# Patient Record
Sex: Female | Born: 1990 | Hispanic: No | Marital: Single | Smoking: Never smoker
Health system: Southern US, Community
[De-identification: ages and names within clinical notes are randomized; demographics above are authoritative.]

## PROBLEM LIST (undated history)

## (undated) DIAGNOSIS — K5090 Crohn's disease, unspecified, without complications: Secondary | ICD-10-CM

## (undated) DIAGNOSIS — I10 Essential (primary) hypertension: Secondary | ICD-10-CM

## (undated) DIAGNOSIS — D649 Anemia, unspecified: Secondary | ICD-10-CM

## (undated) DIAGNOSIS — N83209 Unspecified ovarian cyst, unspecified side: Secondary | ICD-10-CM

## (undated) DIAGNOSIS — F419 Anxiety disorder, unspecified: Secondary | ICD-10-CM

## (undated) DIAGNOSIS — D72821 Monocytosis (symptomatic): Secondary | ICD-10-CM

## (undated) HISTORY — PX: NO PAST SURGERIES: SHX2092

## (undated) HISTORY — PX: TONSILLECTOMY: SUR1361

---

## 2012-04-23 ENCOUNTER — Emergency Department (HOSPITAL_BASED_OUTPATIENT_CLINIC_OR_DEPARTMENT_OTHER)
Admission: EM | Admit: 2012-04-23 | Discharge: 2012-04-23 | Disposition: A | Discharge status: Home / Self Care | Payer: Medicaid Other | Attending: Emergency Medicine | Admitting: Emergency Medicine

## 2012-04-23 ENCOUNTER — Encounter (HOSPITAL_BASED_OUTPATIENT_CLINIC_OR_DEPARTMENT_OTHER): Payer: Self-pay

## 2012-04-23 ENCOUNTER — Emergency Department (HOSPITAL_BASED_OUTPATIENT_CLINIC_OR_DEPARTMENT_OTHER): Payer: Medicaid Other

## 2012-04-23 DIAGNOSIS — R109 Unspecified abdominal pain: Secondary | ICD-10-CM

## 2012-04-23 DIAGNOSIS — D649 Anemia, unspecified: Secondary | ICD-10-CM | POA: Insufficient documentation

## 2012-04-23 DIAGNOSIS — N83209 Unspecified ovarian cyst, unspecified side: Secondary | ICD-10-CM | POA: Insufficient documentation

## 2012-04-23 DIAGNOSIS — N390 Urinary tract infection, site not specified: Secondary | ICD-10-CM | POA: Insufficient documentation

## 2012-04-23 LAB — COMPREHENSIVE METABOLIC PANEL
BUN: 6 mg/dL (ref 6–23)
CO2: 27 mEq/L (ref 19–32)
Chloride: 104 mEq/L (ref 96–112)
Creatinine, Ser: 0.6 mg/dL (ref 0.50–1.10)
GFR calc non Af Amer: >90 mL/min (ref >90)
Total Bilirubin: 0.3 mg/dL (ref 0.3–1.2)

## 2012-04-23 LAB — URINALYSIS, ROUTINE W REFLEX MICROSCOPIC
Glucose, UA: NEGATIVE mg/dL
Protein, ur: NEGATIVE mg/dL

## 2012-04-23 LAB — PREGNANCY, URINE: Preg Test, Ur: NEGATIVE

## 2012-04-23 LAB — CBC WITH DIFFERENTIAL/PLATELET
HCT: 26.3 % — ABNORMAL LOW (ref 36.0–46.0)
Hemoglobin: 8.4 g/dL — ABNORMAL LOW (ref 12.0–15.0)
Lymphocytes Relative: 23 % (ref 12–46)
MCHC: 31.9 g/dL (ref 30.0–36.0)
Monocytes Absolute: 1.6 10*3/uL — ABNORMAL HIGH (ref 0.1–1.0)
Monocytes Relative: 23 % — ABNORMAL HIGH (ref 3–12)
Neutro Abs: 3.4 10*3/uL (ref 1.7–7.7)
WBC: 6.9 10*3/uL (ref 4.0–10.5)

## 2012-04-23 LAB — LIPASE, BLOOD: Lipase: 15 U/L (ref 11–59)

## 2012-04-23 LAB — RAPID STREP SCREEN (MED CTR MEBANE ONLY): Streptococcus, Group A Screen (Direct): NEGATIVE

## 2012-04-23 IMAGING — CT CT ABD-PELV W/O CM
2 of 4 series · 16 of 46 positions shown, 18 images · non-contrast
Comparison: No priors.

CLINICAL DATA: Left-sided abdominal pain.  Hematuria.

CT ABDOMEN AND PELVIS WITHOUT CONTRAST
TECHNIQUE: Multidetector CT imaging of the abdomen and pelvis was
performed following the standard protocol without intravenous
contrast.

[Series 2: renal stone < 200 lbs 5.0 b31f · axial · 0.55mm/px · z∈[-433,-43]mm · 13 of 86 slices shown, 15 images]
[im 4/86  soft-tissue]
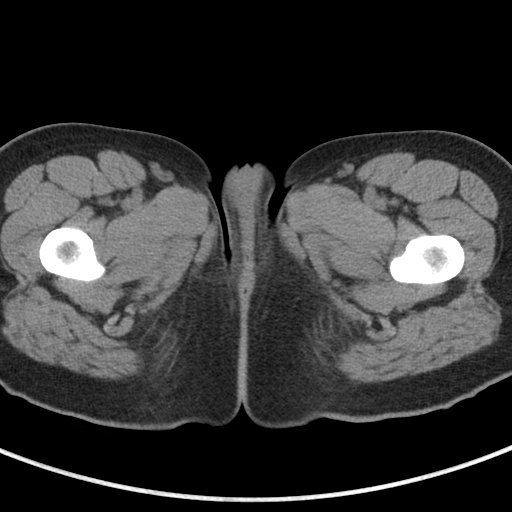
[im 4/86  bone]
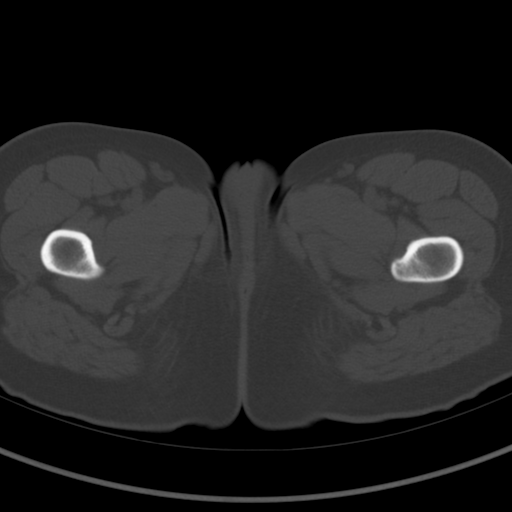
[im 11/86  soft-tissue]
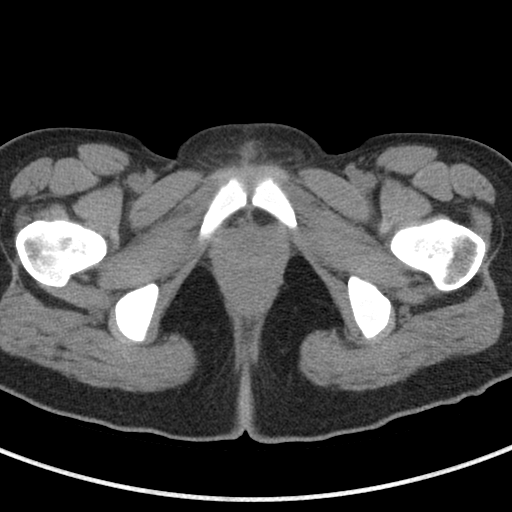
[im 18/86  soft-tissue]
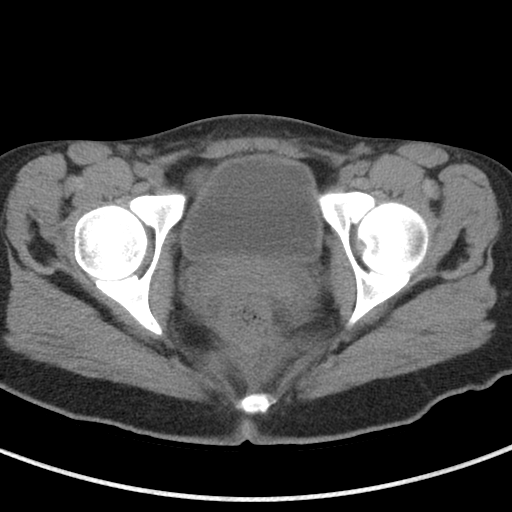
[im 24/86  soft-tissue]
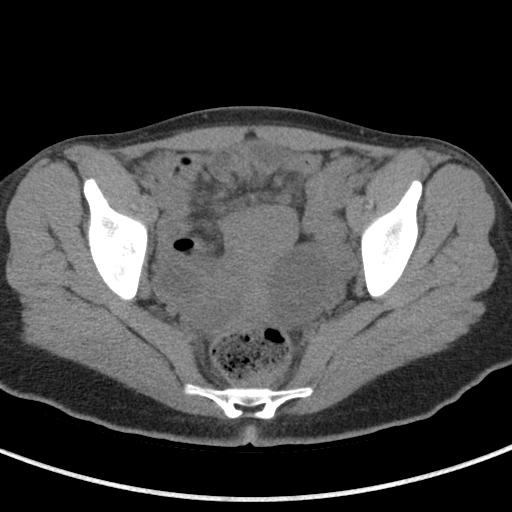
[im 31/86  soft-tissue]
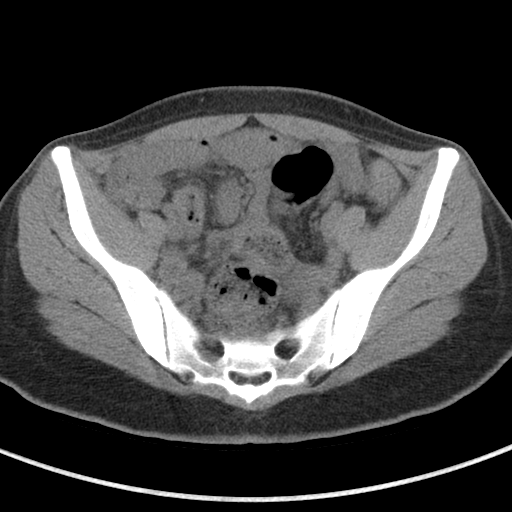
[im 38/86  soft-tissue]
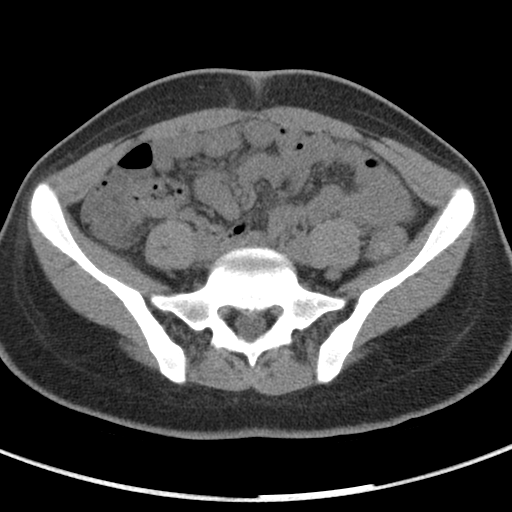
[im 45/86  soft-tissue]
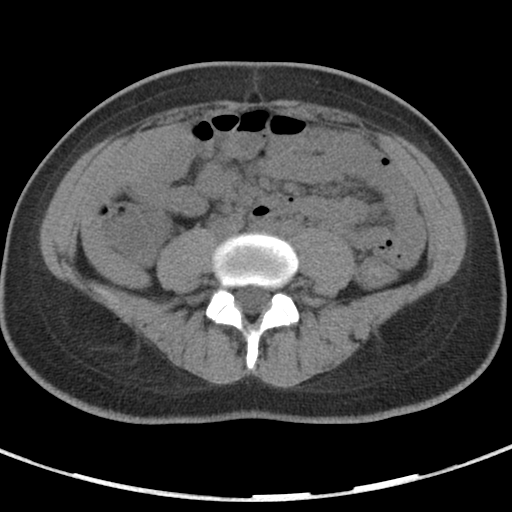
[im 48/86  soft-tissue]
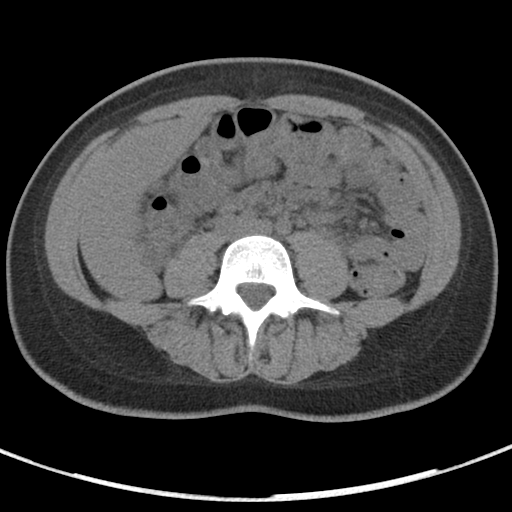
[im 55/86  soft-tissue]
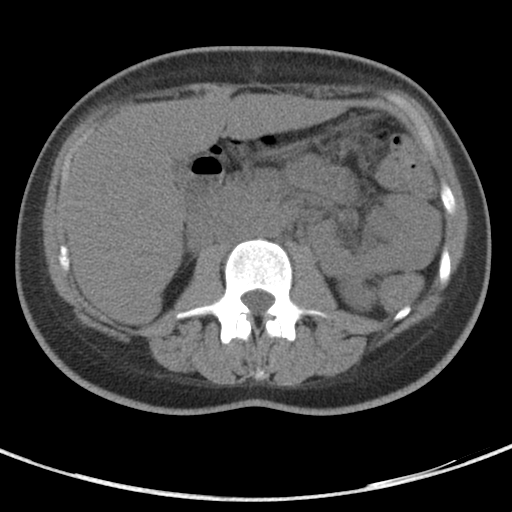
[im 55/86  bone]
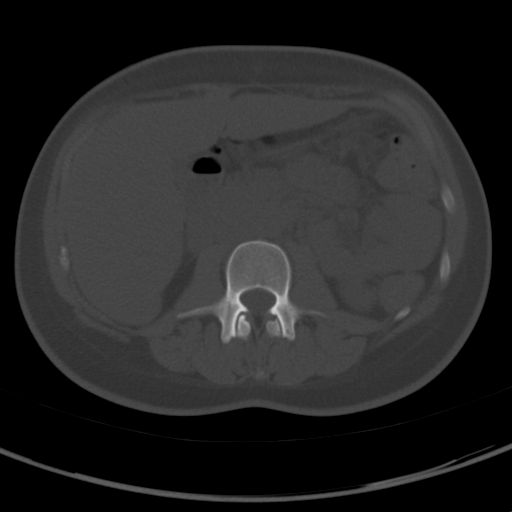
[im 62/86  soft-tissue]
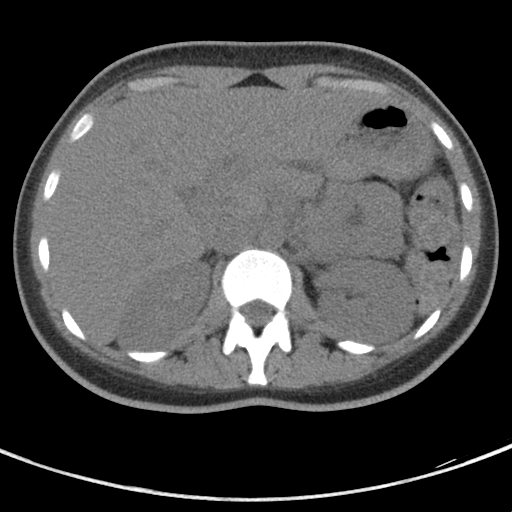
[im 69/86  soft-tissue]
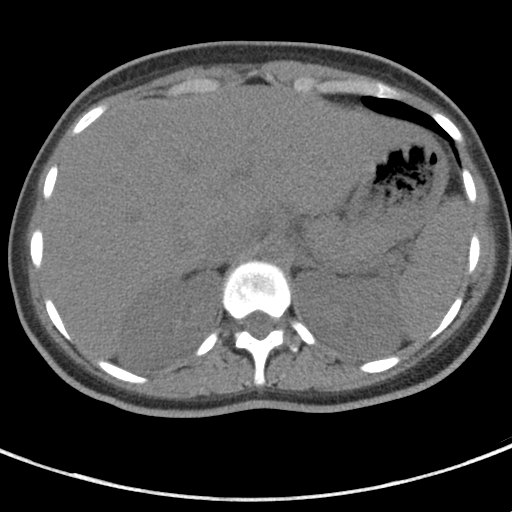
[im 75/86  soft-tissue]
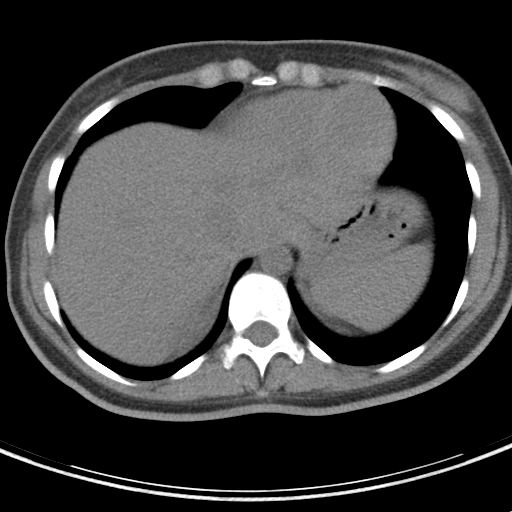
[im 82/86  soft-tissue]
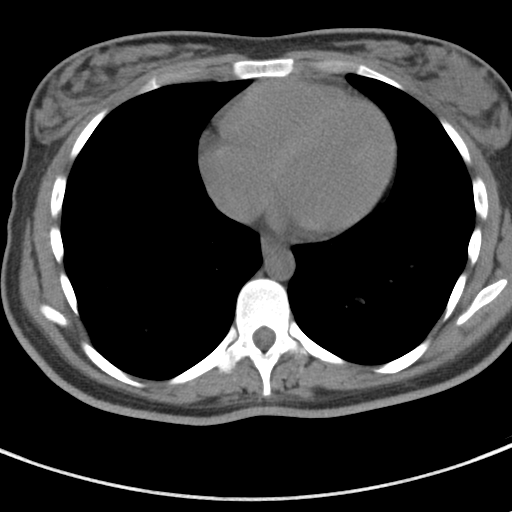

[Series 4: renal stone 3.0 coronal · coronal · 0.62mm/px · 3 of 67 slices shown]
[im 23/67  soft-tissue]
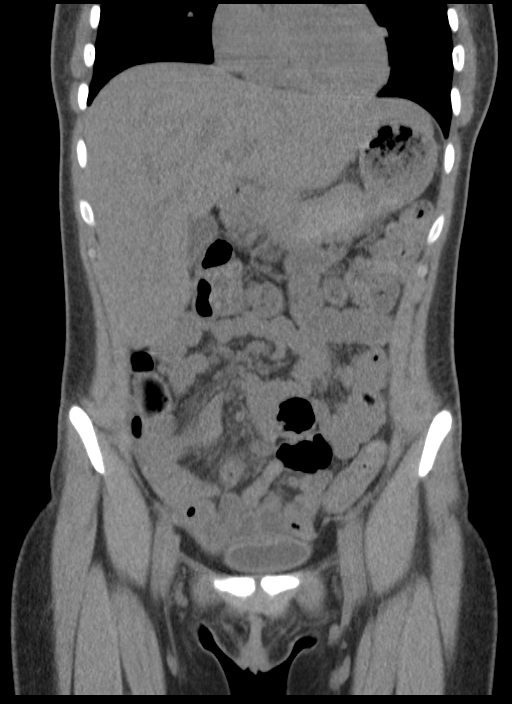
[im 30/67  soft-tissue]
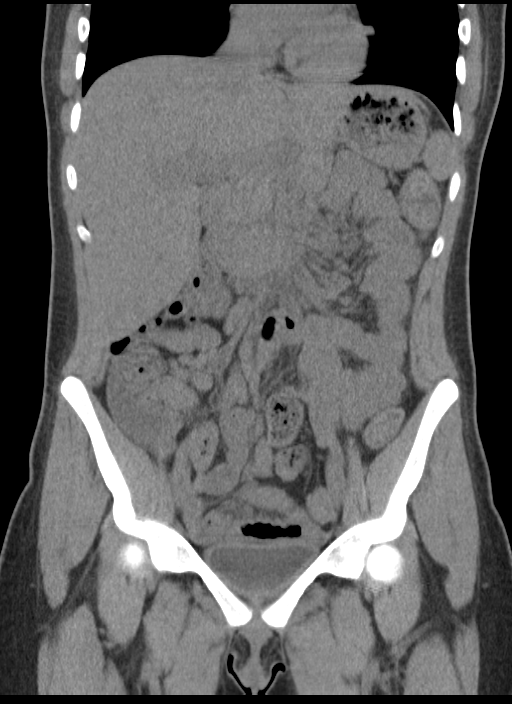
[im 37/67  soft-tissue]
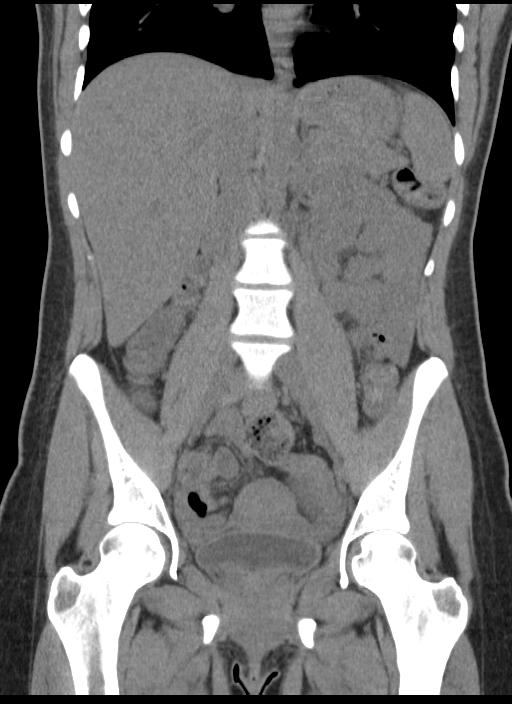

[16 of 46 positions shown; findings below may reference images not displayed]

FINDINGS: Lung Bases: Unremarkable.

Abdomen/Pelvis:  Normal calcifications within the collecting system
of either kidney, along the course of either ureter, or within the
lumen of the urinary bladder.  No hydroureteronephrosis or
perinephric stranding to suggest urinary tract obstruction at this
time.

The unenhanced appearance of the liver, gallbladder, pancreas,
spleen, bilateral adrenal glands and bilateral kidneys is
unremarkable.  There is no ascites or pneumoperitoneum and no
pathologic distension of bowel.  No definite pathologic
lymphadenopathy identified within the abdomen or pelvis on this
noncontrast CT examination.  There are low attenuation lesions in
the adnexa bilaterally, the largest of which measures 4.6 x 3.9 x
5.4 cm on the left side, favored to represent ovarian cysts.
Uterus is unremarkable.  Urinary bladder is normal in appearance.

Musculoskeletal: There are no aggressive appearing lytic or blastic
lesions noted in the visualized portions of the skeleton.
IMPRESSION: 1.  No evidence of abnormal urinary tract calculi or findings to
suggest urinary tract obstruction.
2.  Multiple bilateral low attenuation adnexal lesions likely
represent ovarian cysts.  The largest of these measures 4.6 x 3.9 x
5.4 cm in the left ovary.  This could be followed with a
transvaginal ultrasound at 6 -10 weeks (i.e., at a different phase
of the patient's menstrual cycle) to ensure resolution.
Alternatively, if there is any clinical concern for ovarian
torsion, an ultrasound examination may be warranted at this time.

## 2012-04-23 MED ORDER — POTASSIUM CHLORIDE CRYS ER 20 MEQ PO TBCR
40.0000 meq | EXTENDED_RELEASE_TABLET | Freq: Once | ORAL | Status: AC
  Administered 2012-04-23: 40 meq via ORAL
  Filled 2012-04-23: qty 2

## 2012-04-23 MED ORDER — SULFAMETHOXAZOLE-TRIMETHOPRIM 800-160 MG PO TABS: 1.0000 | ORAL_TABLET | Freq: Two times a day (BID) | ORAL | Status: AC

## 2012-04-23 NOTE — ED Provider Notes (Signed)
History     CSN: [id number]  Arrival date & time 04/23/12  0714   First MD Initiated Contact with Patient 04/23/12 0740      Chief Complaint  Patient presents with  . Abdominal Pain    (Consider location/radiation/quality/duration/timing/severity/associated sxs/prior treatment) HPI  Patient complaining of abdominal pain for 2 months. She states that it is crampy in her upper abdomen. She does not have any nausea or vomiting but feels like she may have been eating less secondary to this. The pain comes and goes. Often starts after having eaten. The pain is moderate. She denies any fever, chills, diarrhea, urinary tract infection symptoms, abnormal vaginal discharge. She has regular menses that are heavy and has been particularly previously diagnosed with anemia  History reviewed. No pertinent past medical history.  History reviewed. No pertinent past surgical history.  No family history on file.  History  Substance Use Topics  . Smoking status: Never Smoker   . Smokeless tobacco: Never Used  . Alcohol Use: No    OB History    Grav Para Term Preterm Abortions TAB SAB Ect Mult Living                  Review of Systems  All other systems reviewed and are negative.    Allergies  Benadryl and Penicillins  Home Medications  No current outpatient prescriptions on file.  BP 117/66  Pulse 104  Temp 97.9 F (36.6 C) (Oral)  Resp 16  Ht 5\' 2"  (1.575 m)  Wt 125 lb (56.7 kg)  BMI 22.86 kg/m2  SpO2 100%  LMP 04/02/2012  Physical Exam  Nursing note and vitals reviewed. Constitutional: She appears well-developed and well-nourished.  HENT:  Head: Normocephalic and atraumatic.  Eyes: Conjunctivae and EOM are normal. Pupils are equal, round, and reactive to light.  Neck: Normal range of motion. Neck supple.  Cardiovascular: Normal rate, regular rhythm, normal heart sounds and intact distal pulses.   Pulmonary/Chest: Effort normal and breath sounds normal.  Abdominal:  Soft. Bowel sounds are normal. There is hepatomegaly. There is no splenomegaly. There is no rebound and no CVA tenderness. No hernia.       Mild epigastric and left upper quadrant ttp  Musculoskeletal: Normal range of motion.  Neurological: She is alert.  Skin: Skin is warm and dry.  Psychiatric: She has a normal mood and affect. Thought content normal.    ED Course  Procedures (including critical care time)  Labs Reviewed  URINALYSIS, ROUTINE W REFLEX MICROSCOPIC - Abnormal; Notable for the following:    Color, Urine AMBER (*)  BIOCHEMICALS MAY BE AFFECTED BY COLOR   Hgb urine dipstick SMALL (*)     Bilirubin Urine SMALL (*)     Ketones, ur 15 (*)     Leukocytes, UA TRACE (*)     All other components within normal limits  CBC WITH DIFFERENTIAL - Abnormal; Notable for the following:    RBC 3.54 (*)     Hemoglobin 8.4 (*)     HCT 26.3 (*)     MCV 74.3 (*)     MCH 23.7 (*)     Platelets 504 (*)     Monocytes Relative 23 (*)     Monocytes Absolute 1.6 (*)     All other components within normal limits  COMPREHENSIVE METABOLIC PANEL - Abnormal; Notable for the following:    Potassium 3.0 (*)     Glucose, Bld 122 (*)     Albumin 2.6 (*)  All other components within normal limits  PREGNANCY, URINE  LIPASE, BLOOD  RAPID STREP SCREEN  URINE MICROSCOPIC-ADD ON   Ct Abdomen Pelvis Wo Contrast  04/23/2012  *RADIOLOGY REPORT*  Clinical Data: Left-sided abdominal pain.  Hematuria.  CT ABDOMEN AND PELVIS WITHOUT CONTRAST  Technique:  Multidetector CT imaging of the abdomen and pelvis was performed following the standard protocol without intravenous contrast.  Comparison: No priors.  Findings:  Lung Bases: Unremarkable.  Abdomen/Pelvis:  Normal calcifications within the collecting system of either kidney, along the course of either ureter, or within the lumen of the urinary bladder.  No hydroureteronephrosis or perinephric stranding to suggest urinary tract obstruction at this time.  The  unenhanced appearance of the liver, gallbladder, pancreas, spleen, bilateral adrenal glands and bilateral kidneys is unremarkable.  There is no ascites or pneumoperitoneum and no pathologic distension of bowel.  No definite pathologic lymphadenopathy identified within the abdomen or pelvis on this noncontrast CT examination.  There are low attenuation lesions in the adnexa bilaterally, the largest of which measures 4.6 x 3.9 x 5.4 cm on the left side, favored to represent ovarian cysts. Uterus is unremarkable.  Urinary bladder is normal in appearance.  Musculoskeletal: There are no aggressive appearing lytic or blastic lesions noted in the visualized portions of the skeleton.  IMPRESSION: 1.  No evidence of abnormal urinary tract calculi or findings to suggest urinary tract obstruction. 2.  Multiple bilateral low attenuation adnexal lesions likely represent ovarian cysts.  The largest of these measures 4.6 x 3.9 x 5.4 cm in the left ovary.  This could be followed with a transvaginal ultrasound at 6 -10 weeks (i.e., at a different phase of the patient's menstrual cycle) to ensure resolution. Alternatively, if there is any clinical concern for ovarian torsion, an ultrasound examination may be warranted at this time.  Original Report Authenticated By: Florencia Reasons, M.D.     No diagnosis found.    MDM   Results for orders placed during the hospital encounter of 04/23/12  URINALYSIS, ROUTINE W REFLEX MICROSCOPIC      Component Value Range   Color, Urine AMBER (*) YELLOW   APPearance CLEAR  CLEAR   Specific Gravity, Urine 1.017  1.005 - 1.030   pH 6.0  5.0 - 8.0   Glucose, UA NEGATIVE  NEGATIVE mg/dL   Hgb urine dipstick SMALL (*) NEGATIVE   Bilirubin Urine SMALL (*) NEGATIVE   Ketones, ur 15 (*) NEGATIVE mg/dL   Protein, ur NEGATIVE  NEGATIVE mg/dL   Urobilinogen, UA 0.2  0.0 - 1.0 mg/dL   Nitrite NEGATIVE  NEGATIVE   Leukocytes, UA TRACE (*) NEGATIVE  PREGNANCY, URINE      Component Value  Range   Preg Test, Ur NEGATIVE  NEGATIVE  CBC WITH DIFFERENTIAL      Component Value Range   WBC 6.9  4.0 - 10.5 K/uL   RBC 3.54 (*) 3.87 - 5.11 MIL/uL   Hemoglobin 8.4 (*) 12.0 - 15.0 g/dL   HCT 16.1 (*) 09.6 - 04.5 %   MCV 74.3 (*) 78.0 - 100.0 fL   MCH 23.7 (*) 26.0 - 34.0 pg   MCHC 31.9  30.0 - 36.0 g/dL   RDW 40.9  81.1 - 91.4 %   Platelets 504 (*) 150 - 400 K/uL   Neutrophils Relative 49  43 - 77 %   Neutro Abs 3.4  1.7 - 7.7 K/uL   Lymphocytes Relative 23  12 - 46 %   Lymphs  Abs 1.6  0.7 - 4.0 K/uL   Monocytes Relative 23 (*) 3 - 12 %   Monocytes Absolute 1.6 (*) 0.1 - 1.0 K/uL   Eosinophils Relative 5  0 - 5 %   Eosinophils Absolute 0.3  0.0 - 0.7 K/uL   Basophils Relative 0  0 - 1 %   Basophils Absolute 0.0  0.0 - 0.1 K/uL  COMPREHENSIVE METABOLIC PANEL      Component Value Range   Sodium 140  135 - 145 mEq/L   Potassium 3.0 (*) 3.5 - 5.1 mEq/L   Chloride 104  96 - 112 mEq/L   CO2 27  19 - 32 mEq/L   Glucose, Bld 122 (*) 70 - 99 mg/dL   BUN 6  6 - 23 mg/dL   Creatinine, Ser 4.54  0.50 - 1.10 mg/dL   Calcium 9.0  8.4 - 09.8 mg/dL   Total Protein 6.7  6.0 - 8.3 g/dL   Albumin 2.6 (*) 3.5 - 5.2 g/dL   AST 18  0 - 37 U/L   ALT 20  0 - 35 U/L   Alkaline Phosphatase 114  39 - 117 U/L   Total Bilirubin 0.3  0.3 - 1.2 mg/dL   GFR calc non Af Amer >90  >90 mL/min   GFR calc Af Amer >90  >90 mL/min  LIPASE, BLOOD      Component Value Range   Lipase 15  11 - 59 U/L  RAPID STREP SCREEN      Component Value Range   Streptococcus, Group A Screen (Direct) NEGATIVE  NEGATIVE  URINE MICROSCOPIC-ADD ON      Component Value Range   Squamous Epithelial / LPF RARE  RARE   WBC, UA 3-6  <3 WBC/hpf   RBC / HPF 3-6  <3 RBC/hpf   Bacteria, UA RARE  RARE      Patient with hypokalemia and anemia. She denies any lightheadedness or chest pain or shortness of breath. She reports that she is taking iron at home. She does have a primary care Dr. She is advised to continue her iron is  to follow up with her primary care Dr. regarding her anemia. She is given oral repletion of her potassium here. She has 3-6 red blood cells in her urine along with 3-6 white blood cells and rare epithelial cells. She is not currently menstruating. She'll be placed on a short course of antibiotics and is advised have hematuria rechecked. CT of the abdomen without contrast was done due to the left-sided pain with the hematuria. There is no evidence of kidney stone seen on CT scan. She does have ovarian cysts. These will need to be followed up and she is advised. She'll be scheduled for an outpatient ultrasound of the upper abdomen as her pain has been epigastric although more to the left side. She is unable have this obtained morning because she just eaten prior to coming in. She is given referral for followup for her primary care for all the above.  Hilario Quarry, MD 04/23/12 [phone]

## 2012-04-23 NOTE — ED Notes (Signed)
Pt reports generalized abdominal pain that started 2 months ago.

## 2012-04-23 NOTE — ED Notes (Signed)
MD at bedside. 

## 2012-04-23 NOTE — ED Notes (Signed)
Family at bedside. 

## 2012-04-23 NOTE — ED Notes (Signed)
Patient transported to CT 

## 2012-05-06 ENCOUNTER — Encounter (HOSPITAL_BASED_OUTPATIENT_CLINIC_OR_DEPARTMENT_OTHER): Payer: Self-pay | Admitting: *Deleted

## 2012-05-06 ENCOUNTER — Emergency Department (HOSPITAL_BASED_OUTPATIENT_CLINIC_OR_DEPARTMENT_OTHER): Payer: Medicaid Other

## 2012-05-06 ENCOUNTER — Emergency Department (HOSPITAL_BASED_OUTPATIENT_CLINIC_OR_DEPARTMENT_OTHER)
Admission: EM | Admit: 2012-05-06 | Discharge: 2012-05-06 | Disposition: A | Discharge status: Home / Self Care | Payer: Medicaid Other | Attending: Emergency Medicine | Admitting: Emergency Medicine

## 2012-05-06 DIAGNOSIS — R110 Nausea: Secondary | ICD-10-CM | POA: Insufficient documentation

## 2012-05-06 DIAGNOSIS — R509 Fever, unspecified: Secondary | ICD-10-CM | POA: Insufficient documentation

## 2012-05-06 DIAGNOSIS — R1031 Right lower quadrant pain: Secondary | ICD-10-CM | POA: Insufficient documentation

## 2012-05-06 HISTORY — DX: Unspecified ovarian cyst, unspecified side: N83.209

## 2012-05-06 HISTORY — DX: Anemia, unspecified: D64.9

## 2012-05-06 LAB — CBC WITH DIFFERENTIAL/PLATELET
Band Neutrophils: 35 % — ABNORMAL HIGH (ref 0–10)
Blasts: 0 %
HCT: 22.7 % — ABNORMAL LOW (ref 36.0–46.0)
Lymphocytes Relative: 14 % (ref 12–46)
Lymphs Abs: 1.1 10*3/uL (ref 0.7–4.0)
MCH: 23.9 pg — ABNORMAL LOW (ref 26.0–34.0)
MCHC: 32.6 g/dL (ref 30.0–36.0)
Metamyelocytes Relative: 3 %
Myelocytes: 3 %
Promyelocytes Absolute: 1 %
RDW: 16.3 % — ABNORMAL HIGH (ref 11.5–15.5)

## 2012-05-06 LAB — URINALYSIS, ROUTINE W REFLEX MICROSCOPIC
Glucose, UA: NEGATIVE mg/dL
Specific Gravity, Urine: 1.025 (ref 1.005–1.030)
Urobilinogen, UA: 2 mg/dL — ABNORMAL HIGH (ref 0.0–1.0)

## 2012-05-06 LAB — COMPREHENSIVE METABOLIC PANEL
ALT: 24 U/L (ref 0–35)
AST: 17 U/L (ref 0–37)
Albumin: 2.4 g/dL — ABNORMAL LOW (ref 3.5–5.2)
Alkaline Phosphatase: 132 U/L — ABNORMAL HIGH (ref 39–117)
Chloride: 100 mEq/L (ref 96–112)
Potassium: 3 mEq/L — ABNORMAL LOW (ref 3.5–5.1)
Sodium: 133 mEq/L — ABNORMAL LOW (ref 135–145)
Total Bilirubin: 0.7 mg/dL (ref 0.3–1.2)
Total Protein: 6.5 g/dL (ref 6.0–8.3)

## 2012-05-06 LAB — URINE MICROSCOPIC-ADD ON

## 2012-05-06 IMAGING — CT CT ABD-PELV W/ CM
2 of 4 series · 16 of 46 positions shown, 18 images · IV contrast (APPLIED)
Comparison: [DATE]

No acute osseous finding.

CLINICAL DATA: Lower abdominal pain, nausea

CT ABDOMEN AND PELVIS WITH CONTRAST
TECHNIQUE: Multidetector CT imaging of the abdomen and pelvis was
performed following the standard protocol during bolus
administration of intravenous contrast.
Contrast: 36mL OMNIPAQUE IOHEXOL 300 MG/ML  SOLN, 100mL OMNIPAQUE
IOHEXOL 300 MG/ML  SOLN

[Series 2: abd/pelvis 5.0 b31f · axial · 0.58mm/px · z∈[-359,+11]mm · 13 of 82 slices shown, 15 images]
[im 4/82  soft-tissue]
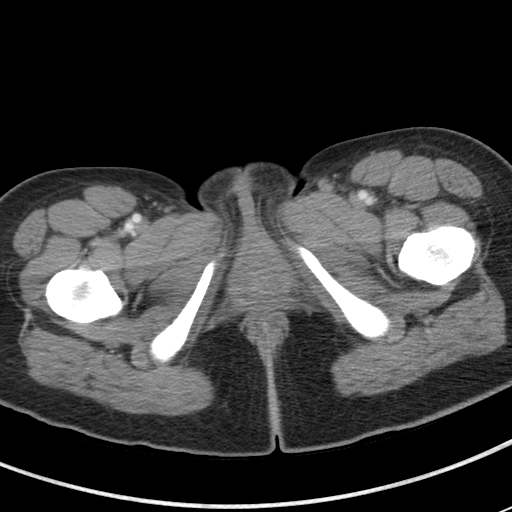
[im 4/82  bone]
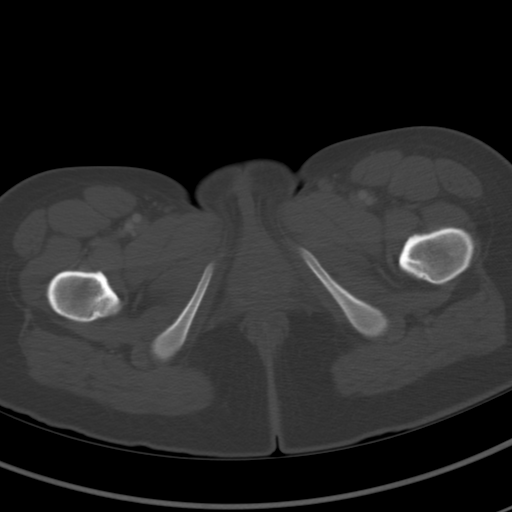
[im 11/82  soft-tissue]
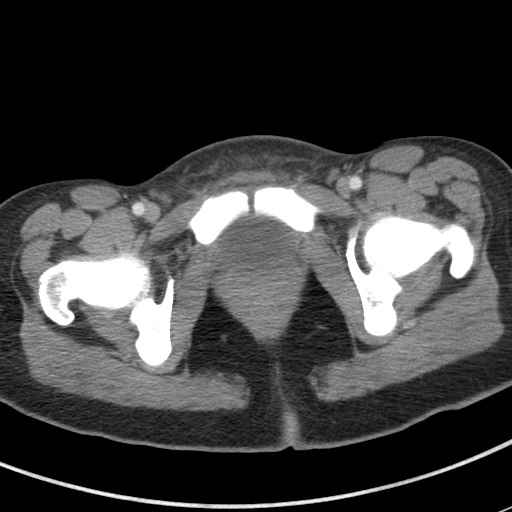
[im 17/82  soft-tissue]
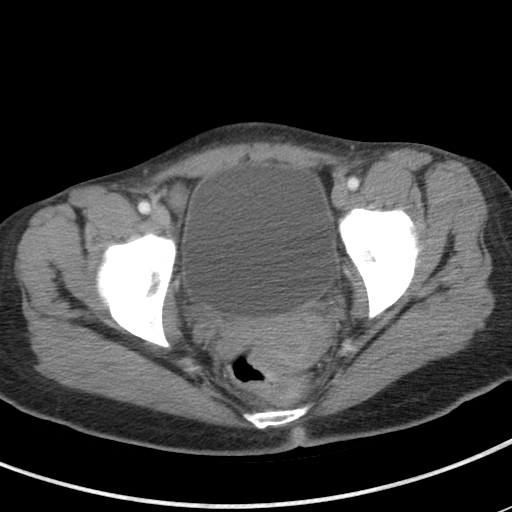
[im 24/82  soft-tissue]
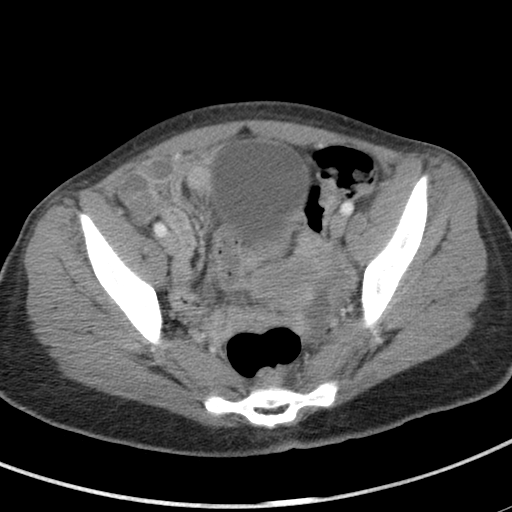
[im 28/82  soft-tissue]
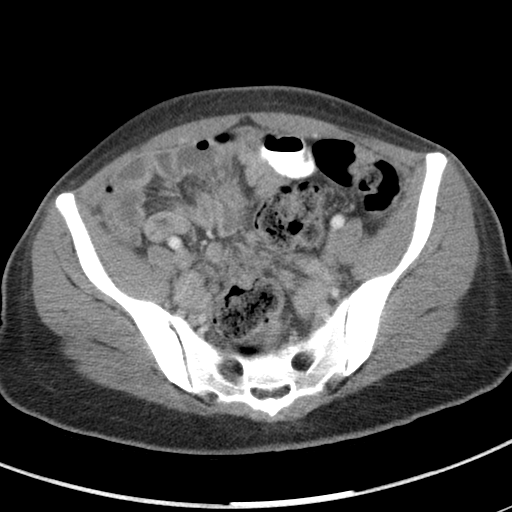
[im 34/82  soft-tissue]
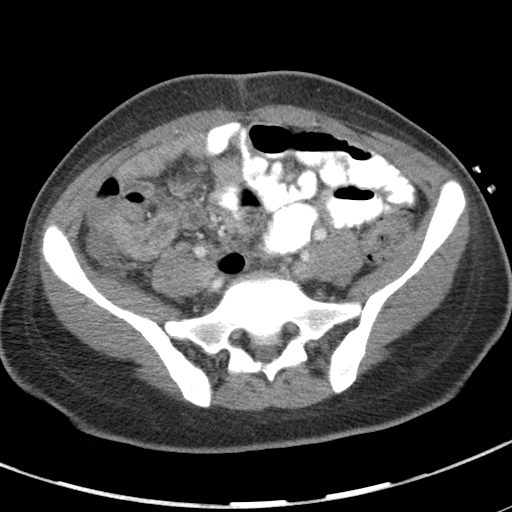
[im 41/82  soft-tissue]
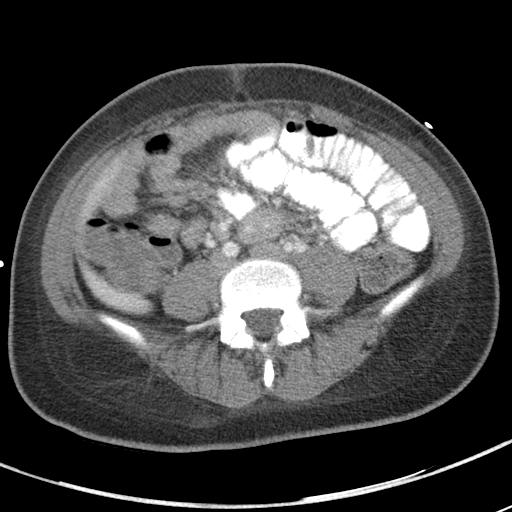
[im 48/82  soft-tissue]
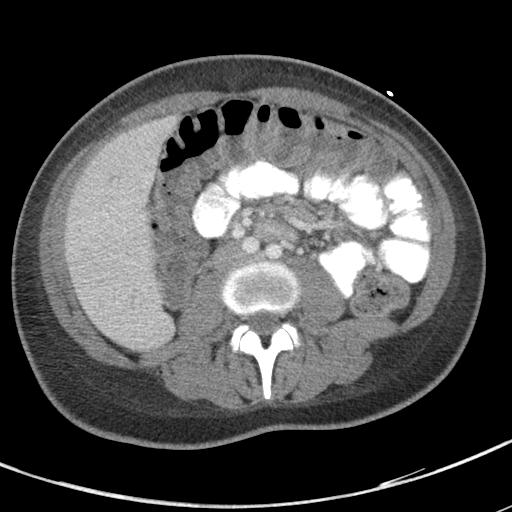
[im 55/82  soft-tissue]
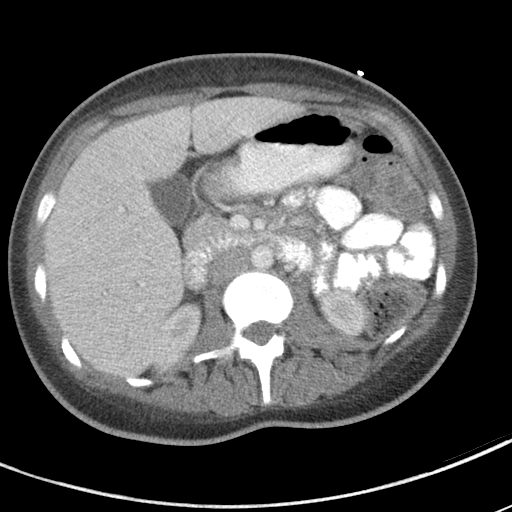
[im 55/82  bone]
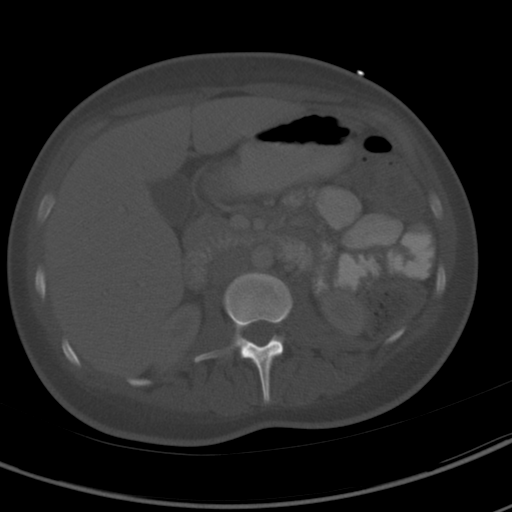
[im 58/82  soft-tissue]
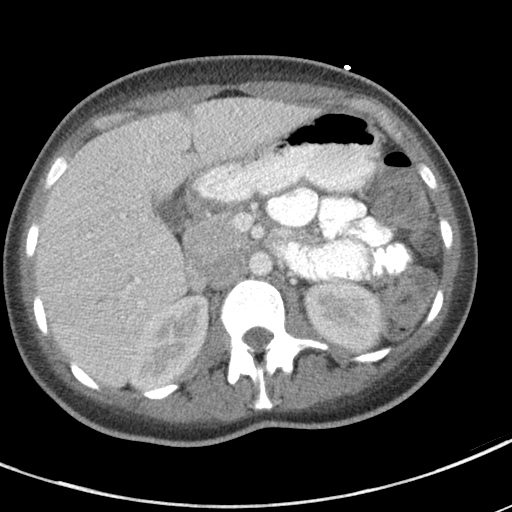
[im 65/82  soft-tissue]
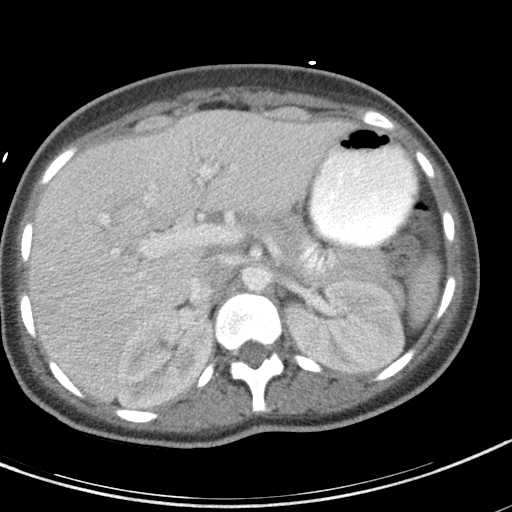
[im 71/82  soft-tissue]
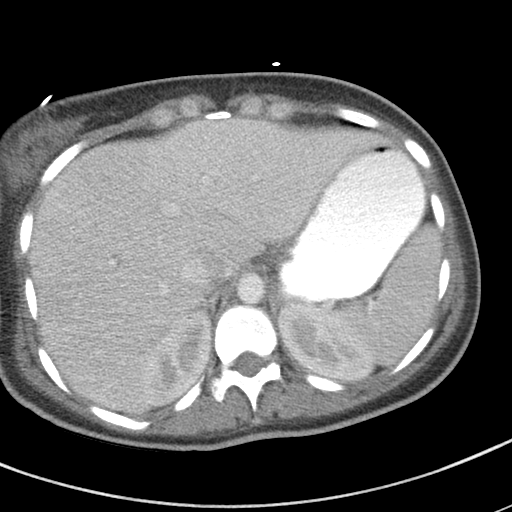
[im 78/82  soft-tissue]
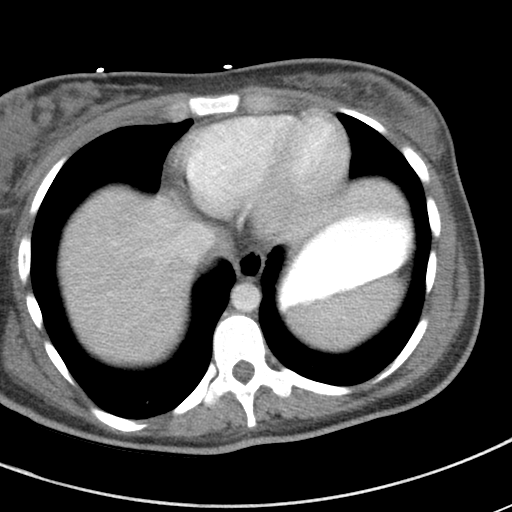

[Series 5: abd/pelvis 3.0 coronal · coronal · 0.63mm/px · 3 of 77 slices shown]
[im 26/77  soft-tissue]
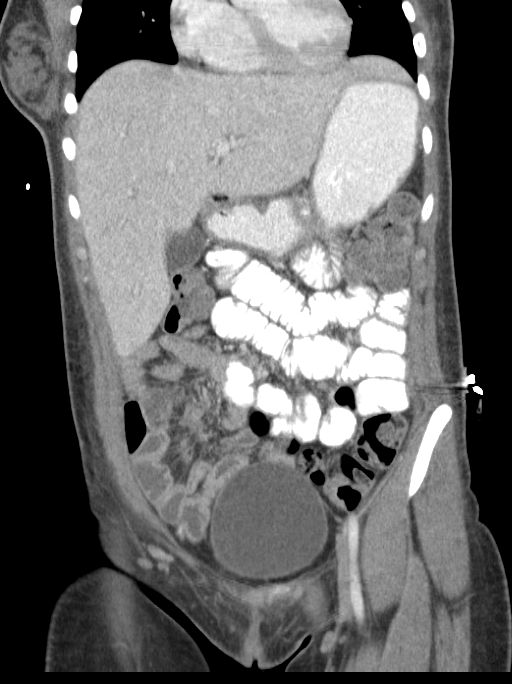
[im 34/77  soft-tissue]
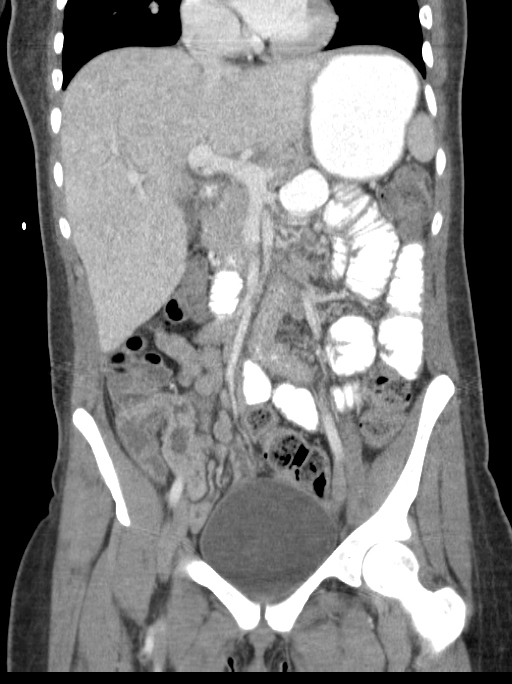
[im 43/77  soft-tissue]
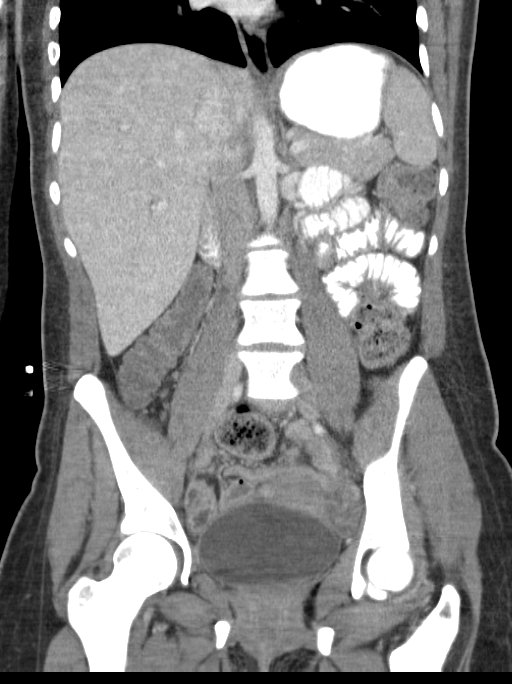

[16 of 46 positions shown; findings below may reference images not displayed]

FINDINGS: Lung bases clear.  Normal heart size.  No pericardial or
pleural effusion.  No hiatal hernia.

Abdomen:  Focal fatty infiltration of the liver along the falciform
ligament, image 26 new.  No other hepatic abnormality or biliary
dilatation.  Gallbladder, biliary system, pancreas, spleen, adrenal
glands, kidneys are within normal limits and demonstrate no acute
finding for the patient's age.

No abdominal free fluid, fluid collection, hemorrhage, adenopathy,
or abscess.

Negative for bowel obstruction, dilatation, ileus, or free air.

Pelvis:  Stable mildly prominent right lower quadrant mesenteric
lymph nodes, image 45.  No abnormal appendix demonstrated.  No
acute distal bowel process.  Trace pelvic free fluid.  Smaller left
adnexal likely ovarian cyst measuring 3.5 x 1.5 cm, previously
x 4.0 cm.  The urinary bladder is unremarkable.  Normal sized
uterus.  No pelvic abscess, hemorrhage, adenopathy, inguinal
abnormality, hernia.

No acute osseous finding
IMPRESSION: No acute intra-abdominal or pelvic process.  Smaller left ovarian
cyst.

## 2012-05-06 MED ORDER — SODIUM CHLORIDE 0.9 % IV BOLUS (SEPSIS)
1000.0000 mL | Freq: Once | INTRAVENOUS | Status: AC
  Administered 2012-05-06: 1000 mL via INTRAVENOUS

## 2012-05-06 MED ORDER — ACETAMINOPHEN 500 MG PO TABS
1000.0000 mg | ORAL_TABLET | Freq: Once | ORAL | Status: AC
  Administered 2012-05-06: 1000 mg via ORAL
  Filled 2012-05-06: qty 2

## 2012-05-06 MED ORDER — IOHEXOL 300 MG/ML  SOLN
100.0000 mL | Freq: Once | INTRAMUSCULAR | Status: AC | PRN
  Administered 2012-05-06: 100 mL via INTRAVENOUS

## 2012-05-06 MED ORDER — SODIUM CHLORIDE 0.9 % IV SOLN
1000.0000 mL | INTRAVENOUS | Status: DC
  Administered 2012-05-06: 1000 mL via INTRAVENOUS

## 2012-05-06 MED ORDER — HYDROCODONE-ACETAMINOPHEN 5-325 MG PO TABS: ORAL_TABLET | ORAL | Status: DC

## 2012-05-06 MED ORDER — SODIUM CHLORIDE 0.9 % IV SOLN
1000.0000 mL | Freq: Once | INTRAVENOUS | Status: AC
  Administered 2012-05-06: 1000 mL via INTRAVENOUS

## 2012-05-06 MED ORDER — IOHEXOL 300 MG/ML  SOLN
36.0000 mL | Freq: Once | INTRAMUSCULAR | Status: AC | PRN
  Administered 2012-05-06: 36 mL via ORAL

## 2012-05-06 MED ORDER — ONDANSETRON HCL 4 MG/2ML IJ SOLN
4.0000 mg | Freq: Once | INTRAMUSCULAR | Status: AC
  Administered 2012-05-06: 4 mg via INTRAVENOUS
  Filled 2012-05-06: qty 2

## 2012-05-06 MED ORDER — MORPHINE SULFATE 4 MG/ML IJ SOLN
4.0000 mg | Freq: Once | INTRAMUSCULAR | Status: AC
  Administered 2012-05-06: 4 mg via INTRAVENOUS
  Filled 2012-05-06: qty 1

## 2012-05-06 NOTE — ED Notes (Signed)
Pt c/o lower abd pain and nausea x 2 days. Seen here 2 weeks ago for same.  DX ovarian cyst

## 2012-05-07 LAB — PATHOLOGIST SMEAR REVIEW: Path Review: NORMAL

## 2012-05-07 NOTE — ED Provider Notes (Signed)
History     CSN: [id number]  Arrival date & time 05/06/12  1445   First MD Initiated Contact with Patient 05/06/12 1548      Chief Complaint  Patient presents with  . Abdominal Pain    (Consider location/radiation/quality/duration/timing/severity/associated sxs/prior treatment) Patient is a 21 y.o. female presenting with abdominal pain.  Abdominal Pain The primary symptoms of the illness include abdominal pain, fever and nausea.   Patient is a 21 year old female who presents today complaining of acute onset is severe 8/10 sharp right lower quadrant pain yesterday. Patient has had some nausea with this but denies any vomiting. Patient also denies diarrhea, constipation, or urinary symptoms. Patient denies vaginal discharge as well and reports that her last period in yesterday. Patient has no concern for sexual transmitted disease otherwise. She does have history of ovarian cyst noted on the left on prior CT from July 24. Patient has no history of abdominal surgeries. Patient reports decreased appetite today as well. Pain is worse with palpation movement.  Patient endorses fevers and was febrile to 103 on presentation with a heart rate of 122. There no other associated or modifying factors. Past Medical History  Diagnosis Date  . Ovarian cyst   . Anemia     History reviewed. No pertinent past surgical history.  History reviewed. No pertinent family history.  History  Substance Use Topics  . Smoking status: Never Smoker   . Smokeless tobacco: Never Used  . Alcohol Use: No    OB History    Grav Para Term Preterm Abortions TAB SAB Ect Mult Living                  Review of Systems  Constitutional: Positive for fever and appetite change.  HENT: Negative.   Eyes: Negative.   Respiratory: Negative.   Cardiovascular: Negative.   Gastrointestinal: Positive for nausea and abdominal pain.  Genitourinary: Negative.   Musculoskeletal: Negative.   Skin: Negative.   Neurological:  Negative.   Hematological: Negative.   Psychiatric/Behavioral: Negative.   All other systems reviewed and are negative.    Allergies  Benadryl and Penicillins  Home Medications   Current Outpatient Rx  Name Route Sig Dispense Refill  . FERROUS SULFATE 325 (65 FE) MG PO TABS Oral Take 325 mg by mouth daily with breakfast.    . ONE-DAILY MULTI VITAMINS PO TABS Oral Take 1 tablet by mouth daily.    Marland Kitchen NAPROXEN SODIUM 220 MG PO TABS Oral Take 440 mg by mouth 2 (two) times daily with a meal. For pain.    . SULFAMETHOXAZOLE-TRIMETHOPRIM 800-160 MG PO TABS Oral Take 1 tablet by mouth 2 (two) times daily.    Marland Kitchen HYDROCODONE-ACETAMINOPHEN 5-325 MG PO TABS  Take 1-2 tabs by mouth every 6 hours when necessary pain. 15 tablet 0    BP 101/64  Pulse 102  Temp 98.7 F (37.1 C) (Oral)  Resp 24  Ht 5\' 2"  (1.575 m)  Wt 125 lb (56.7 kg)  BMI 22.86 kg/m2  SpO2 100%  LMP 05/01/2012  Physical Exam GEN: Well-developed, well-nourished female in no distress HEENT: Atraumatic, normocephalic. Oropharynx clear without erythema EYES: PERRLA BL, no scleral icterus. NECK: Trachea midline, no meningismus CV: Tachycardic with regular rhythm. No murmurs, rubs, or gallops PULM: No respiratory distress.  No crackles, wheezes, or rales. GI: soft, right lower quadrant tenderness. No guarding, rebound. + bowel sounds  GU: deferred Neuro: cranial nerves grossly 2-12 intact, no abnormalities of strength or sensation, A and O  x 3 MSK: Patient moves all 4 extremities symmetrically, no deformity, edema, or injury noted Skin: No rashes petechiae, purpura, or jaundice Psych: no abnormality of mood  ED Course  Procedures (including critical care time)  Labs Reviewed  URINALYSIS, ROUTINE W REFLEX MICROSCOPIC - Abnormal; Notable for the following:    Color, Urine AMBER (*)  BIOCHEMICALS MAY BE AFFECTED BY COLOR   Hgb urine dipstick MODERATE (*)     Bilirubin Urine SMALL (*)     Ketones, ur 15 (*)     Protein, ur  30 (*)     Urobilinogen, UA 2.0 (*)     Leukocytes, UA TRACE (*)     All other components within normal limits  URINE MICROSCOPIC-ADD ON - Abnormal; Notable for the following:    Bacteria, UA FEW (*)     All other components within normal limits  CBC WITH DIFFERENTIAL - Abnormal; Notable for the following:    RBC 3.10 (*)     Hemoglobin 7.4 (*)     HCT 22.7 (*)     MCV 73.2 (*)     MCH 23.9 (*)     RDW 16.3 (*)     Platelets 561 (*)     Neutrophils Relative 24 (*)     Monocytes Relative 20 (*)     Band Neutrophils 35 (*)     Monocytes Absolute 1.6 (*)     All other components within normal limits  COMPREHENSIVE METABOLIC PANEL - Abnormal; Notable for the following:    Sodium 133 (*)     Potassium 3.0 (*)     Glucose, Bld 104 (*)     Calcium 8.3 (*)     Albumin 2.4 (*)     Alkaline Phosphatase 132 (*)     All other components within normal limits  PREGNANCY, URINE  LIPASE, BLOOD  LACTIC ACID, PLASMA  PATHOLOGIST SMEAR REVIEW   Ct Abdomen Pelvis W Contrast  05/06/2012  *RADIOLOGY REPORT*  Clinical Data: Lower abdominal pain, nausea  CT ABDOMEN AND PELVIS WITH CONTRAST  Technique:  Multidetector CT imaging of the abdomen and pelvis was performed following the standard protocol during bolus administration of intravenous contrast.  Contrast: 36mL OMNIPAQUE IOHEXOL 300 MG/ML  SOLN, OMNIPAQUE IOHEXOL 300 MG/ML  SOLN  Comparison: 04/23/2012  No acute osseous finding.  Findings: Lung bases clear.  Normal heart size.  No pericardial or pleural effusion.  No hiatal hernia.  Abdomen:  Focal fatty infiltration of the liver along the falciform ligament, image 26 new.  No other hepatic abnormality or biliary dilatation.  Gallbladder, biliary system, pancreas, spleen, adrenal glands, kidneys are within normal limits and demonstrate no acute finding for the patient's age.  No abdominal free fluid, fluid collection, hemorrhage, adenopathy, or abscess.  Negative for bowel obstruction, dilatation,  ileus, or free air.  Pelvis:  Stable mildly prominent right lower quadrant mesenteric lymph nodes, image 45.  No abnormal appendix demonstrated.  No acute distal bowel process.  Trace pelvic free fluid.  Smaller left adnexal likely ovarian cyst measuring 3.5 x 1.5 cm, previously 4.6 x 4.0 cm.  The urinary bladder is unremarkable.  Normal sized uterus.  No pelvic abscess, hemorrhage, adenopathy, inguinal abnormality, hernia.  No acute osseous finding  IMPRESSION: No acute intra-abdominal or pelvic process.  Smaller left ovarian cyst.  Original Report Authenticated By: Judie Petit. Ruel Favors, M.D.     1. RLQ abdominal pain   2. Fever       MDM  Patient was evaluated by myself. Based on presentation patient had had workup for possible sepsis. She received IV fluids as well as nausea medications and Tylenol. Patient did have right lower quadrant tenderness. There she had history of ovarian cysts largest of these is located on the left and was not in line with the patient's presentation. Urine was for markable for blood the patient was just finishing her menses. There were no stones or urinary tract infection. Patient had CT of abdomen and pelvis with nonspecific lymphadenopathy noted again in the right lower quadrant. I discussed this with the reading radiologist who confirms that this was very nonspecific finding and despite patient's presentation would not make him more suspicious for appendicitis based on her rate graphic studies. Patient had  laboratory workup with no leukocytosis, no renal compromise, no liver function or lipase abnormalities. Patient did have an anemia with a hemoglobin of 7.4 today. This is microcytic and patient started taking iron. Patient had no chest pain, shortness of breath, or lightheadedness. Her vital sign abnormalities resolved with IV fluids and symptomatic therapy. Patient is studying to be a Engineer, site and did note that her heart rate is frequently between 101 15 when she  checks it even at baseline. I was comfortable with patient's heart rate having improved down to the low 100s prior to discharge. Family did request a short course of pain medication as patient has had several episodes of this pain. This is provided but family was advised that if patient had return of her fevers, significant changes in her symptoms, or other emergent concerns they should return the emergency department. Patient has followup scheduled with gynecologist for her ovarian cyst. Cysts were noted to be smaller on CT today and I had no concerns for torsion based on presentation. Patient had complete resolution of her symptoms after one dose of pain medication. She was discharged in good condition.        Cyndra Numbers, MD 05/07/12 [phone]

## 2013-07-14 ENCOUNTER — Encounter (HOSPITAL_BASED_OUTPATIENT_CLINIC_OR_DEPARTMENT_OTHER): Payer: Self-pay | Admitting: Emergency Medicine

## 2013-07-14 ENCOUNTER — Emergency Department (HOSPITAL_BASED_OUTPATIENT_CLINIC_OR_DEPARTMENT_OTHER)
Admission: EM | Admit: 2013-07-14 | Discharge: 2013-07-14 | Disposition: A | Discharge status: Home / Self Care | Payer: Medicaid Other | Attending: Emergency Medicine | Admitting: Emergency Medicine

## 2013-07-14 DIAGNOSIS — Z791 Long term (current) use of non-steroidal anti-inflammatories (NSAID): Secondary | ICD-10-CM | POA: Insufficient documentation

## 2013-07-14 DIAGNOSIS — N898 Other specified noninflammatory disorders of vagina: Secondary | ICD-10-CM | POA: Insufficient documentation

## 2013-07-14 DIAGNOSIS — D649 Anemia, unspecified: Secondary | ICD-10-CM | POA: Insufficient documentation

## 2013-07-14 DIAGNOSIS — K2970 Gastritis, unspecified, without bleeding: Secondary | ICD-10-CM | POA: Insufficient documentation

## 2013-07-14 DIAGNOSIS — Z79899 Other long term (current) drug therapy: Secondary | ICD-10-CM | POA: Insufficient documentation

## 2013-07-14 DIAGNOSIS — Z3202 Encounter for pregnancy test, result negative: Secondary | ICD-10-CM | POA: Insufficient documentation

## 2013-07-14 DIAGNOSIS — R000 Tachycardia, unspecified: Secondary | ICD-10-CM | POA: Insufficient documentation

## 2013-07-14 DIAGNOSIS — N390 Urinary tract infection, site not specified: Secondary | ICD-10-CM | POA: Insufficient documentation

## 2013-07-14 DIAGNOSIS — Z880 Allergy status to penicillin: Secondary | ICD-10-CM | POA: Insufficient documentation

## 2013-07-14 LAB — COMPREHENSIVE METABOLIC PANEL
ALT: 8 U/L (ref 0–35)
AST: 8 U/L (ref 0–37)
Albumin: 2.4 g/dL — ABNORMAL LOW (ref 3.5–5.2)
CO2: 26 mEq/L (ref 19–32)
Calcium: 8.8 mg/dL (ref 8.4–10.5)
Creatinine, Ser: 0.6 mg/dL (ref 0.50–1.10)
Sodium: 136 mEq/L (ref 135–145)

## 2013-07-14 LAB — PREGNANCY, URINE: Preg Test, Ur: NEGATIVE

## 2013-07-14 LAB — CBC WITH DIFFERENTIAL/PLATELET
Basophils Absolute: 0 10*3/uL (ref 0.0–0.1)
Basophils Relative: 1 % (ref 0–1)
Eosinophils Relative: 3 % (ref 0–5)
HCT: 24 % — ABNORMAL LOW (ref 36.0–46.0)
Lymphocytes Relative: 15 % (ref 12–46)
MCHC: 30 g/dL (ref 30.0–36.0)
MCV: 75.9 fL — ABNORMAL LOW (ref 78.0–100.0)
Monocytes Absolute: 0.9 10*3/uL (ref 0.1–1.0)
Neutro Abs: 3 10*3/uL (ref 1.7–7.7)
Platelets: 731 10*3/uL — ABNORMAL HIGH (ref 150–400)
RDW: 18.8 % — ABNORMAL HIGH (ref 11.5–15.5)
WBC: 4.8 10*3/uL (ref 4.0–10.5)

## 2013-07-14 LAB — WET PREP, GENITAL
Trich, Wet Prep: NONE SEEN
Yeast Wet Prep HPF POC: NONE SEEN

## 2013-07-14 LAB — URINALYSIS, ROUTINE W REFLEX MICROSCOPIC
Nitrite: NEGATIVE
Protein, ur: NEGATIVE mg/dL
Specific Gravity, Urine: 1.025 (ref 1.005–1.030)
Urobilinogen, UA: 1 mg/dL (ref 0.0–1.0)

## 2013-07-14 MED ORDER — SUCRALFATE 1 GM/10ML PO SUSP: 1.0000 g | Freq: Four times a day (QID) | ORAL | Status: DC

## 2013-07-14 MED ORDER — GI COCKTAIL ~~LOC~~
30.0000 mL | Freq: Once | ORAL | Status: AC
  Administered 2013-07-14: 30 mL via ORAL
  Filled 2013-07-14: qty 30

## 2013-07-14 MED ORDER — RANITIDINE HCL 150 MG PO TABS: 150.0000 mg | ORAL_TABLET | Freq: Two times a day (BID) | ORAL | Status: DC

## 2013-07-14 MED ORDER — SODIUM CHLORIDE 0.9 % IV BOLUS (SEPSIS)
1000.0000 mL | Freq: Once | INTRAVENOUS | Status: AC
  Administered 2013-07-14: 1000 mL via INTRAVENOUS

## 2013-07-14 MED ORDER — NITROFURANTOIN MONOHYD MACRO 100 MG PO CAPS: 100.0000 mg | ORAL_CAPSULE | Freq: Two times a day (BID) | ORAL | Status: DC

## 2013-07-14 NOTE — ED Notes (Signed)
Abdominal pain x 2 weeks. Crampy. Diarrhea and nausea.

## 2013-07-14 NOTE — ED Provider Notes (Signed)
CSN: [id number]     Arrival date & time 07/14/13  1608 History   First MD Initiated Contact with Patient 07/14/13 1626     Chief Complaint  Patient presents with  . Abdominal Pain   (Consider location/radiation/quality/duration/timing/severity/associated sxs/prior Treatment) Patient is a 22 y.o. female presenting with abdominal pain. The history is provided by the patient.  Abdominal Pain Pain location:  Epigastric Pain quality: aching, burning, fullness and gnawing   Pain radiates to:  Does not radiate Pain severity:  Moderate Onset quality:  Gradual Duration:  2 weeks Timing:  Constant Progression:  Worsening Chronicity:  New Context: eating   Context: not alcohol use, not diet changes, not previous surgeries, not sick contacts, not suspicious food intake and not trauma   Context comment:  Severe with spicy foods Relieved by:  Nothing Worsened by:  Eating (spicy foods) Ineffective treatments:  None tried Associated symptoms: anorexia, diarrhea and nausea   Associated symptoms: no chest pain, no chills, no cough, no shortness of breath, no vaginal bleeding, no vaginal discharge and no vomiting   Associated symptoms comment:  Loose stools without blood Risk factors: has not had multiple surgeries, no NSAID use, not pregnant and no recent hospitalization     Past Medical History  Diagnosis Date  . Ovarian cyst   . Anemia    History reviewed. No pertinent past surgical history. No family history on file. History  Substance Use Topics  . Smoking status: Never Smoker   . Smokeless tobacco: Never Used  . Alcohol Use: No   OB History   Grav Para Term Preterm Abortions TAB SAB Ect Mult Living                 Review of Systems  Constitutional: Negative for chills.  Respiratory: Negative for cough and shortness of breath.   Cardiovascular: Negative for chest pain.  Gastrointestinal: Positive for nausea, abdominal pain, diarrhea and anorexia. Negative for vomiting.   Genitourinary: Negative for vaginal bleeding and vaginal discharge.  All other systems reviewed and are negative.    Allergies  Benadryl and Penicillins  Home Medications   Current Outpatient Rx  Name  Route  Sig  Dispense  Refill  . ferrous sulfate 325 (65 FE) MG tablet   Oral   Take 325 mg by mouth daily with breakfast.         . HYDROcodone-acetaminophen (NORCO/VICODIN) 5-325 MG per tablet      Take 1-2 tabs by mouth every 6 hours when necessary pain.   15 tablet   0   . Multiple Vitamin (MULTIVITAMIN) tablet   Oral   Take 1 tablet by mouth daily.         . naproxen sodium (ANAPROX) 220 MG tablet   Oral   Take 440 mg by mouth 2 (two) times daily with a meal. For pain.         Marland Kitchen sulfamethoxazole-trimethoprim (SEPTRA DS) 800-160 MG per tablet   Oral   Take 1 tablet by mouth 2 (two) times daily.          BP 112/64  Pulse 102  Temp(Src) 99.5 F (37.5 C) (Oral)  Resp 20  Ht 5\' 3"  (1.6 m)  Wt 114 lb (51.71 kg)  BMI 20.2 kg/m2  SpO2 100%  LMP 06/21/2013 Physical Exam  Nursing note and vitals reviewed. Constitutional: She is oriented to person, place, and time. She appears well-developed and well-nourished. No distress.  HENT:  Head: Normocephalic and atraumatic.  Mouth/Throat: Oropharynx  is clear and moist.  Eyes: Conjunctivae and EOM are normal. Pupils are equal, round, and reactive to light.  Neck: Normal range of motion. Neck supple.  Cardiovascular: Regular rhythm and intact distal pulses.  Tachycardia present.   No murmur heard. Pulmonary/Chest: Effort normal and breath sounds normal. No respiratory distress. She has no wheezes. She has no rales.  Abdominal: Soft. Bowel sounds are normal. She exhibits no distension. There is tenderness in the epigastric area and suprapubic area. There is no rebound and no guarding.  Genitourinary: Uterus normal. There is no tenderness on the right labia. There is no tenderness on the left labia. Cervix exhibits no  motion tenderness and no discharge. Right adnexum displays no mass, no tenderness and no fullness. Left adnexum displays no mass, no tenderness and no fullness. No tenderness around the vagina. Vaginal discharge found.  Musculoskeletal: Normal range of motion. She exhibits no edema and no tenderness.  Neurological: She is alert and oriented to person, place, and time.  Skin: Skin is warm and dry. No rash noted. No erythema.  Psychiatric: She has a normal mood and affect. Her behavior is normal.    ED Course  Procedures (including critical care time) Labs Review Labs Reviewed  WET PREP, GENITAL - Abnormal; Notable for the following:    Clue Cells Wet Prep HPF POC MODERATE (*)    WBC, Wet Prep HPF POC MODERATE (*)    All other components within normal limits  URINALYSIS, ROUTINE W REFLEX MICROSCOPIC - Abnormal; Notable for the following:    Color, Urine AMBER (*)    APPearance CLOUDY (*)    Hgb urine dipstick SMALL (*)    Bilirubin Urine SMALL (*)    Ketones, ur 15 (*)    Leukocytes, UA MODERATE (*)    All other components within normal limits  CBC WITH DIFFERENTIAL - Abnormal; Notable for the following:    RBC 3.16 (*)    Hemoglobin 7.2 (*)    HCT 24.0 (*)    MCV 75.9 (*)    MCH 22.8 (*)    RDW 18.8 (*)    Platelets 731 (*)    Monocytes Relative 20 (*)    All other components within normal limits  COMPREHENSIVE METABOLIC PANEL - Abnormal; Notable for the following:    Albumin 2.4 (*)    All other components within normal limits  URINE MICROSCOPIC-ADD ON - Abnormal; Notable for the following:    Squamous Epithelial / LPF FEW (*)    Bacteria, UA MANY (*)    All other components within normal limits  GC/CHLAMYDIA PROBE AMP  URINE CULTURE  PREGNANCY, URINE  LIPASE, BLOOD   Imaging Review No results found.  EKG Interpretation   None       MDM   1. Gastritis   2. UTI (lower urinary tract infection)     Patient here with abdominal pain that is nonspecific but  appears to be worse in the epigastric and suprapubic region. She does have a history of ovarian cysts but states that this seems to be higher than her typical pain from cyst. The pain is worse with eating especially spicy foods. She denies any sick contacts, recent travel or bad food exposure. Also she's had some diarrhea.    Concern for gastritis versus pelvic etiology. Pelvic exam pending. UA, UPT, CBC, CMP, lipase pending.  5:10 PM Chronic anemia here of 7.2 but unchanged from prior.  Pt on iron and felt to be due to heavy menses.  Pt pelvic benign. Improvement after GI cocktail.  6:17 PM abd pain resolved after GI cocktail and CMP, lipase wnl. UA consistent with UTI which is most likely the cause for fever and suprapubic pain.  Treat with macrobid and supportive treatment for gastritis.  Gwyneth Sprout, MD 07/14/13 1818

## 2013-07-15 LAB — URINE CULTURE
Colony Count: NO GROWTH
Culture: NO GROWTH

## 2013-07-15 LAB — GC/CHLAMYDIA PROBE AMP
CT Probe RNA: NEGATIVE
GC Probe RNA: NEGATIVE

## 2013-10-21 ENCOUNTER — Inpatient Hospital Stay (HOSPITAL_COMMUNITY): Payer: Medicaid Other

## 2013-10-21 ENCOUNTER — Inpatient Hospital Stay (HOSPITAL_BASED_OUTPATIENT_CLINIC_OR_DEPARTMENT_OTHER)
Admission: EM | Admit: 2013-10-21 | Discharge: 2013-10-24 | DRG: 385 | Disposition: A | Discharge status: Home / Self Care | Payer: Medicaid Other | Attending: Internal Medicine | Admitting: Internal Medicine

## 2013-10-21 ENCOUNTER — Encounter (HOSPITAL_BASED_OUTPATIENT_CLINIC_OR_DEPARTMENT_OTHER): Payer: Self-pay | Admitting: Emergency Medicine

## 2013-10-21 DIAGNOSIS — D72819 Decreased white blood cell count, unspecified: Secondary | ICD-10-CM

## 2013-10-21 DIAGNOSIS — N920 Excessive and frequent menstruation with regular cycle: Secondary | ICD-10-CM | POA: Diagnosis present

## 2013-10-21 DIAGNOSIS — D696 Thrombocytopenia, unspecified: Secondary | ICD-10-CM | POA: Diagnosis present

## 2013-10-21 DIAGNOSIS — R42 Dizziness and giddiness: Secondary | ICD-10-CM | POA: Diagnosis present

## 2013-10-21 DIAGNOSIS — K5000 Crohn's disease of small intestine without complications: Principal | ICD-10-CM

## 2013-10-21 DIAGNOSIS — R1084 Generalized abdominal pain: Secondary | ICD-10-CM | POA: Diagnosis present

## 2013-10-21 DIAGNOSIS — D709 Neutropenia, unspecified: Secondary | ICD-10-CM | POA: Diagnosis present

## 2013-10-21 DIAGNOSIS — D509 Iron deficiency anemia, unspecified: Secondary | ICD-10-CM | POA: Diagnosis present

## 2013-10-21 DIAGNOSIS — R634 Abnormal weight loss: Secondary | ICD-10-CM | POA: Diagnosis present

## 2013-10-21 DIAGNOSIS — R5383 Other fatigue: Secondary | ICD-10-CM

## 2013-10-21 DIAGNOSIS — R109 Unspecified abdominal pain: Secondary | ICD-10-CM

## 2013-10-21 DIAGNOSIS — D72821 Monocytosis (symptomatic): Secondary | ICD-10-CM | POA: Diagnosis present

## 2013-10-21 DIAGNOSIS — IMO0002 Reserved for concepts with insufficient information to code with codable children: Secondary | ICD-10-CM

## 2013-10-21 DIAGNOSIS — R5381 Other malaise: Secondary | ICD-10-CM | POA: Diagnosis present

## 2013-10-21 DIAGNOSIS — D75839 Thrombocytosis, unspecified: Secondary | ICD-10-CM

## 2013-10-21 DIAGNOSIS — E43 Unspecified severe protein-calorie malnutrition: Secondary | ICD-10-CM

## 2013-10-21 DIAGNOSIS — D649 Anemia, unspecified: Secondary | ICD-10-CM

## 2013-10-21 DIAGNOSIS — D473 Essential (hemorrhagic) thrombocythemia: Secondary | ICD-10-CM | POA: Diagnosis present

## 2013-10-21 DIAGNOSIS — R599 Enlarged lymph nodes, unspecified: Secondary | ICD-10-CM | POA: Diagnosis present

## 2013-10-21 HISTORY — DX: Monocytosis (symptomatic): D72.821

## 2013-10-21 LAB — CBC WITH DIFFERENTIAL/PLATELET
BASOS ABS: 0 10*3/uL (ref 0.0–0.1)
BASOS ABS: 0 10*3/uL (ref 0.0–0.1)
BASOS PCT: 1 % (ref 0–1)
BASOS PCT: 1 % (ref 0–1)
EOS PCT: 4 % (ref 0–5)
EOS PCT: 3 % (ref 0–5)
Eosinophils Absolute: 0.1 10*3/uL (ref 0.0–0.7)
Eosinophils Absolute: 0.1 10*3/uL (ref 0.0–0.7)
HCT: 24.1 % — ABNORMAL LOW (ref 36.0–46.0)
HEMATOCRIT: 23.1 % — AB (ref 36.0–46.0)
HEMOGLOBIN: 6.9 g/dL — AB (ref 12.0–15.0)
Hemoglobin: 7.1 g/dL — ABNORMAL LOW (ref 12.0–15.0)
LYMPHS PCT: 30 % (ref 12–46)
Lymphocytes Relative: 24 % (ref 12–46)
Lymphs Abs: 0.9 10*3/uL (ref 0.7–4.0)
Lymphs Abs: 0.7 10*3/uL (ref 0.7–4.0)
MCH: 23.1 pg — ABNORMAL LOW (ref 26.0–34.0)
MCH: 23.2 pg — ABNORMAL LOW (ref 26.0–34.0)
MCHC: 29.5 g/dL — AB (ref 30.0–36.0)
MCHC: 29.9 g/dL — ABNORMAL LOW (ref 30.0–36.0)
MCV: 78.5 fL (ref 78.0–100.0)
MCV: 77.8 fL — ABNORMAL LOW (ref 78.0–100.0)
MONO ABS: 0.8 10*3/uL (ref 0.1–1.0)
MONOS PCT: 26 % — AB (ref 3–12)
Monocytes Absolute: 0.8 10*3/uL (ref 0.1–1.0)
Monocytes Relative: 26 % — ABNORMAL HIGH (ref 3–12)
NEUTROS ABS: 1.4 10*3/uL — AB (ref 1.7–7.7)
Neutro Abs: 1.3 10*3/uL — ABNORMAL LOW (ref 1.7–7.7)
Neutrophils Relative %: 41 % — ABNORMAL LOW (ref 43–77)
Neutrophils Relative %: 47 % (ref 43–77)
PLATELETS: 576 10*3/uL — AB (ref 150–400)
Platelets: 575 10*3/uL — ABNORMAL HIGH (ref 150–400)
RBC: 3.07 MIL/uL — ABNORMAL LOW (ref 3.87–5.11)
RBC: 2.97 MIL/uL — ABNORMAL LOW (ref 3.87–5.11)
RDW: 19.2 % — AB (ref 11.5–15.5)
RDW: 19.3 % — ABNORMAL HIGH (ref 11.5–15.5)
WBC: 3.1 10*3/uL — AB (ref 4.0–10.5)
WBC: 3 10*3/uL — ABNORMAL LOW (ref 4.0–10.5)

## 2013-10-21 LAB — URINALYSIS, ROUTINE W REFLEX MICROSCOPIC
BILIRUBIN URINE: NEGATIVE
Glucose, UA: NEGATIVE mg/dL
Hgb urine dipstick: NEGATIVE
KETONES UR: NEGATIVE mg/dL
Leukocytes, UA: NEGATIVE
NITRITE: NEGATIVE
PROTEIN: NEGATIVE mg/dL
SPECIFIC GRAVITY, URINE: 1.01 (ref 1.005–1.030)
UROBILINOGEN UA: 0.2 mg/dL (ref 0.0–1.0)
pH: 6.5 (ref 5.0–8.0)

## 2013-10-21 LAB — HIV ANTIBODY (ROUTINE TESTING W REFLEX): HIV: NONREACTIVE

## 2013-10-21 LAB — LACTATE DEHYDROGENASE: LDH: 121 U/L (ref 94–250)

## 2013-10-21 LAB — SEDIMENTATION RATE: Sed Rate: 68 mm/hr — ABNORMAL HIGH (ref 0–22)

## 2013-10-21 LAB — COMPREHENSIVE METABOLIC PANEL
ALK PHOS: 105 U/L (ref 39–117)
ALT: 22 U/L (ref 0–35)
AST: 13 U/L (ref 0–37)
Albumin: 2 g/dL — ABNORMAL LOW (ref 3.5–5.2)
BILIRUBIN TOTAL: 0.3 mg/dL (ref 0.3–1.2)
BUN: 7 mg/dL (ref 6–23)
CHLORIDE: 102 meq/L (ref 96–112)
CO2: 26 mEq/L (ref 19–32)
Calcium: 8.6 mg/dL (ref 8.4–10.5)
Creatinine, Ser: 0.5 mg/dL (ref 0.50–1.10)
GFR calc non Af Amer: >90 mL/min (ref >90)
GLUCOSE: 79 mg/dL (ref 70–99)
POTASSIUM: 3.7 meq/L (ref 3.7–5.3)
Sodium: 140 mEq/L (ref 137–147)
Total Protein: 6.7 g/dL (ref 6.0–8.3)

## 2013-10-21 LAB — HEMOGLOBIN AND HEMATOCRIT, BLOOD
HCT: 19.9 % — ABNORMAL LOW (ref 36.0–46.0)
Hemoglobin: 6 g/dL — CL (ref 12.0–15.0)

## 2013-10-21 LAB — RETICULOCYTES
RBC.: 2.97 MIL/uL — AB (ref 3.87–5.11)
RETIC COUNT ABSOLUTE: 106.9 10*3/uL (ref 19.0–186.0)
Retic Ct Pct: 3.6 % — ABNORMAL HIGH (ref 0.4–3.1)

## 2013-10-21 LAB — IRON AND TIBC
Iron: <10 ug/dL — ABNORMAL LOW (ref 42–135)
UIBC: 216 ug/dL (ref 125–400)

## 2013-10-21 LAB — LIPASE, BLOOD: LIPASE: 13 U/L (ref 11–59)

## 2013-10-21 LAB — ABO/RH: ABO/RH(D): B POS

## 2013-10-21 LAB — OCCULT BLOOD X 1 CARD TO LAB, STOOL: FECAL OCCULT BLD: NEGATIVE

## 2013-10-21 LAB — PREPARE RBC (CROSSMATCH)

## 2013-10-21 LAB — PREGNANCY, URINE: Preg Test, Ur: NEGATIVE

## 2013-10-21 IMAGING — CT CT ABD-PELV W/ CM
2 of 5 series · 17 of 46 positions shown, 19 images · IV contrast (OMNIPAQUE)
Comparison: CT ABD/PELVIS W CM dated [DATE]

CLINICAL DATA: Chronic lower abdominal pain

EXAM:
CT ABDOMEN AND PELVIS WITH CONTRAST
TECHNIQUE: Multidetector CT imaging of the abdomen and pelvis was performed
using the standard protocol following bolus administration of
intravenous contrast.
CONTRAST:  50mL OMNIPAQUE IOHEXOL 300 MG/ML SOLN, 80mL OMNIPAQUE
IOHEXOL 300 MG/ML SOLN

[Series 2: rtn a/p with · axial · 0.74mm/px · z∈[-344,+31]mm · 14 of 85 slices shown, 16 images]
[im 5/85  soft-tissue]
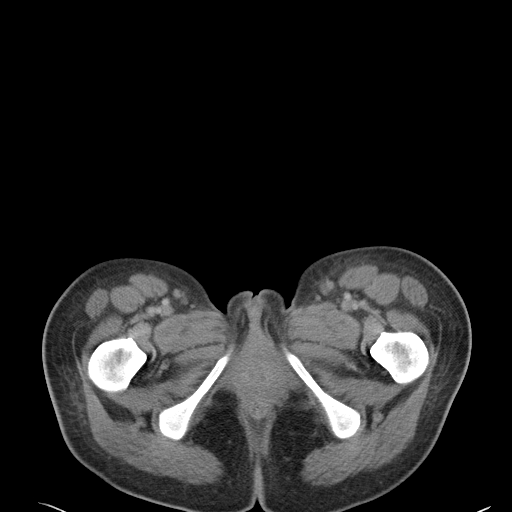
[im 5/85  bone]
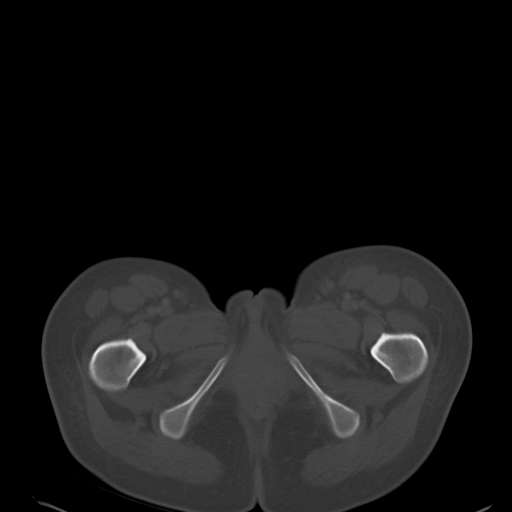
[im 9/85  soft-tissue]
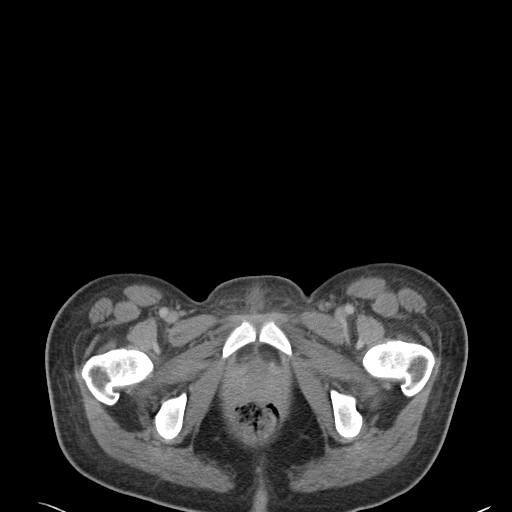
[im 18/85  soft-tissue]
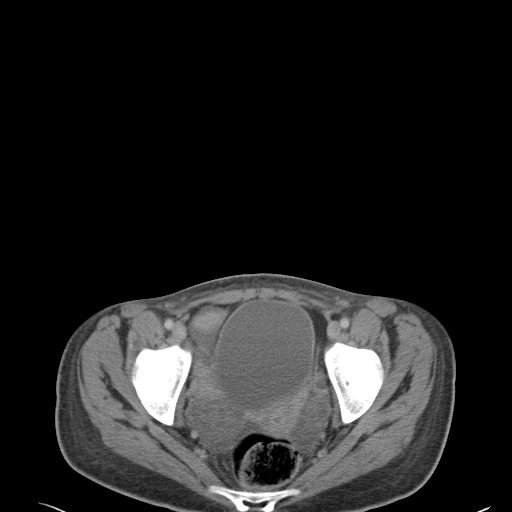
[im 23/85  soft-tissue]
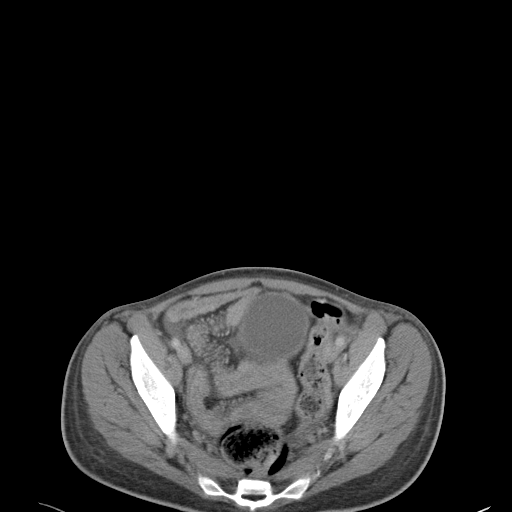
[im 27/85  soft-tissue]
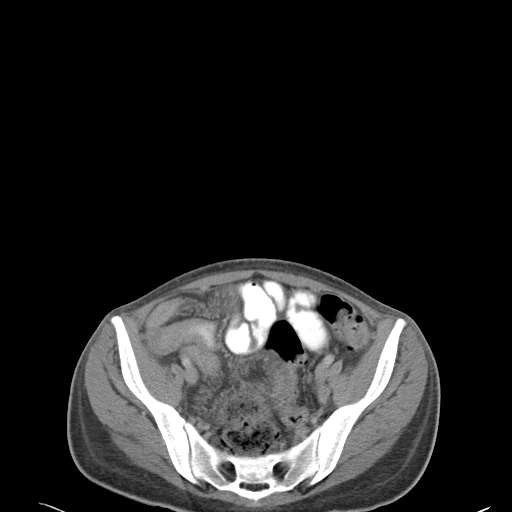
[im 36/85  soft-tissue]
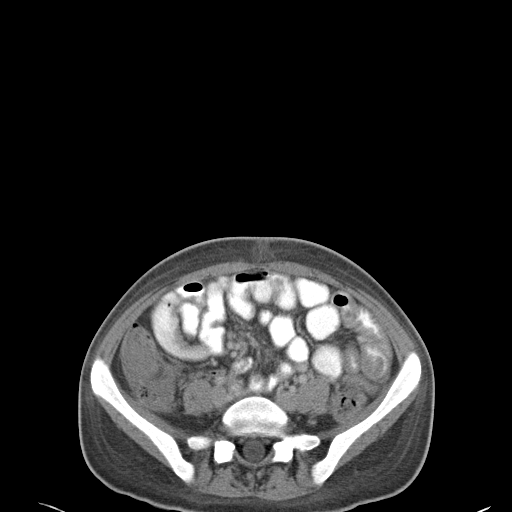
[im 40/85  soft-tissue]
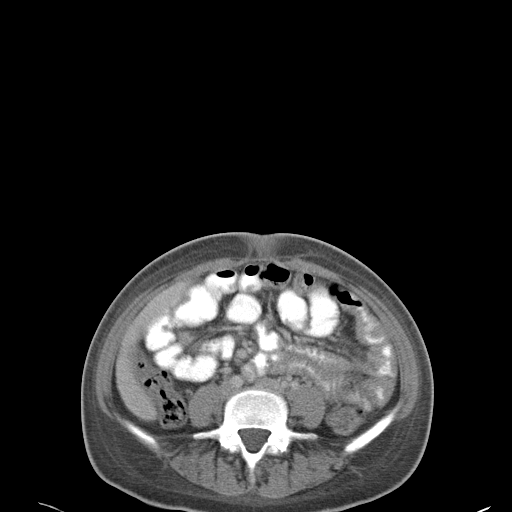
[im 45/85  soft-tissue]
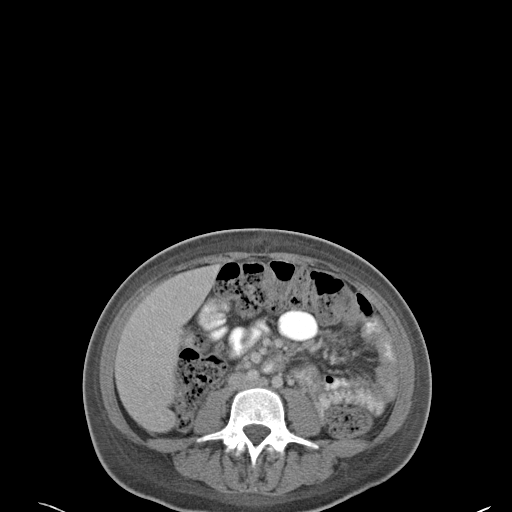
[im 49/85  soft-tissue]
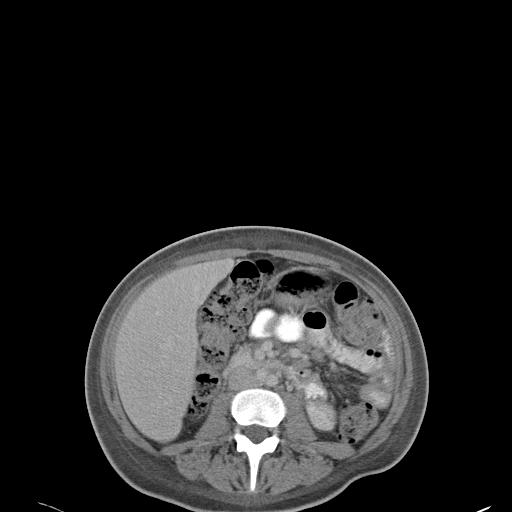
[im 49/85  bone]
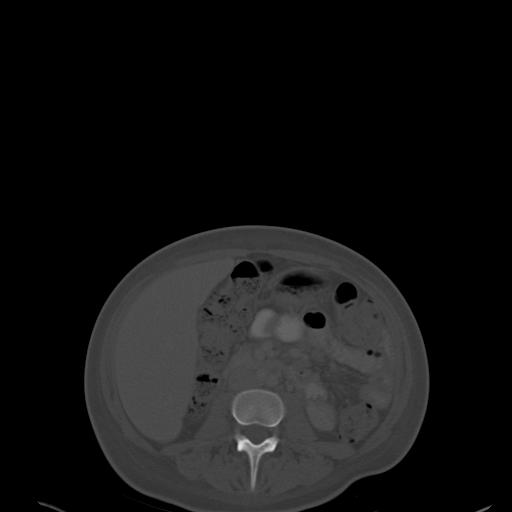
[im 58/85  soft-tissue]
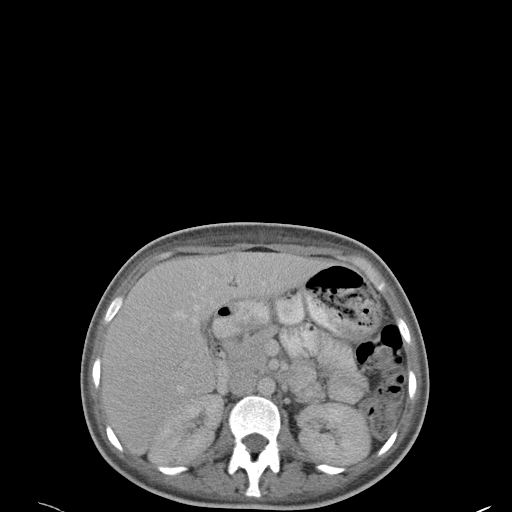
[im 62/85  soft-tissue]
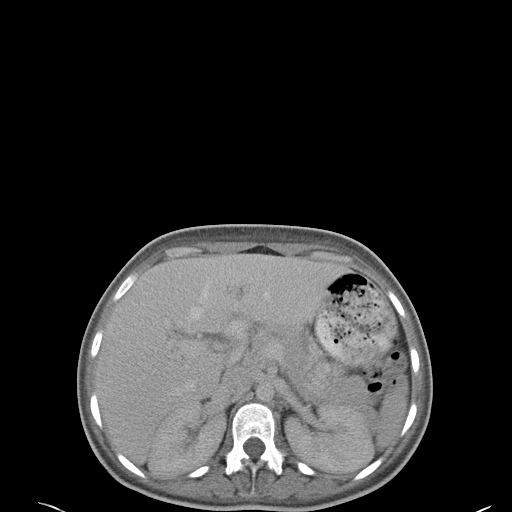
[im 67/85  soft-tissue]
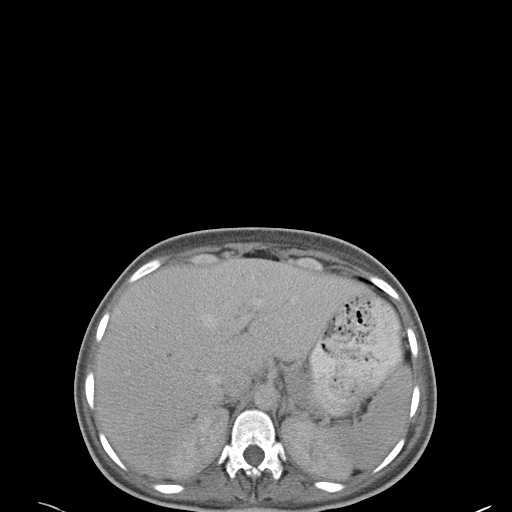
[im 76/85  soft-tissue]
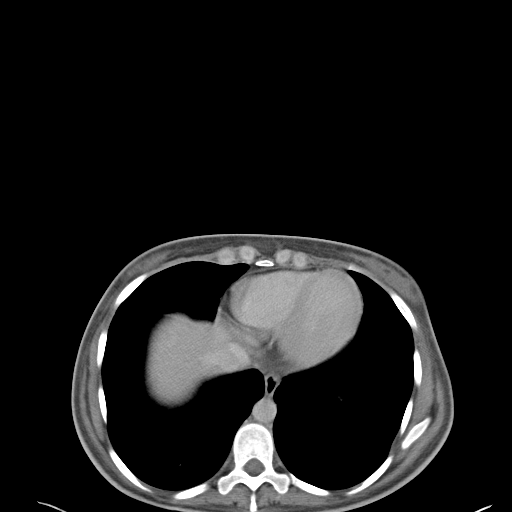
[im 80/85  soft-tissue]
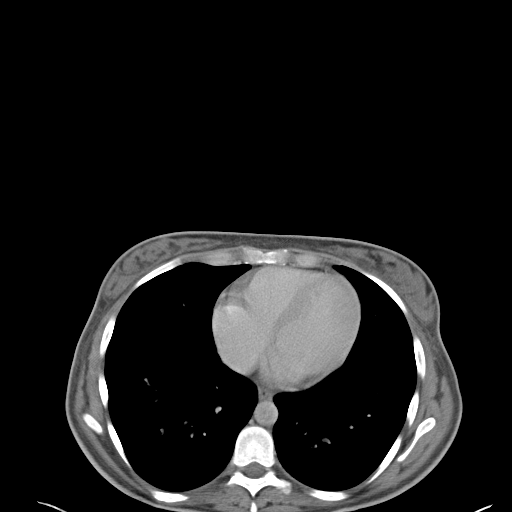

[Series 602: cor · coronal · 0.83mm/px · 3 of 102 slices shown]
[im 34/102  soft-tissue]
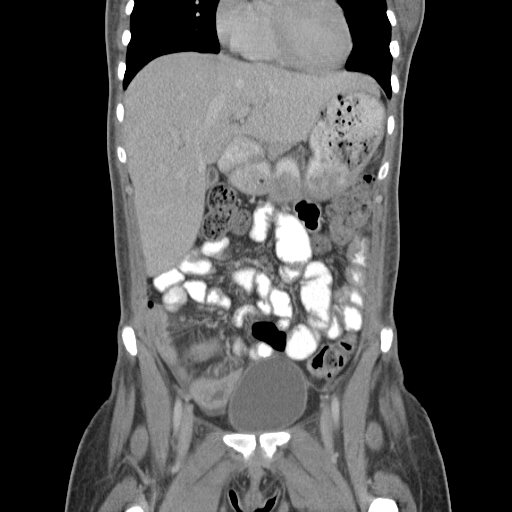
[im 45/102  soft-tissue]
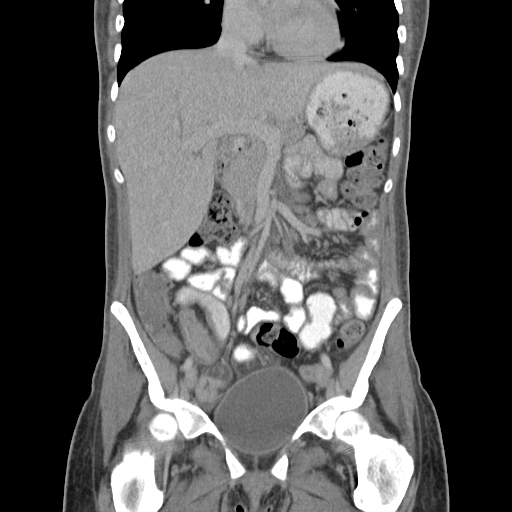
[im 57/102  soft-tissue]
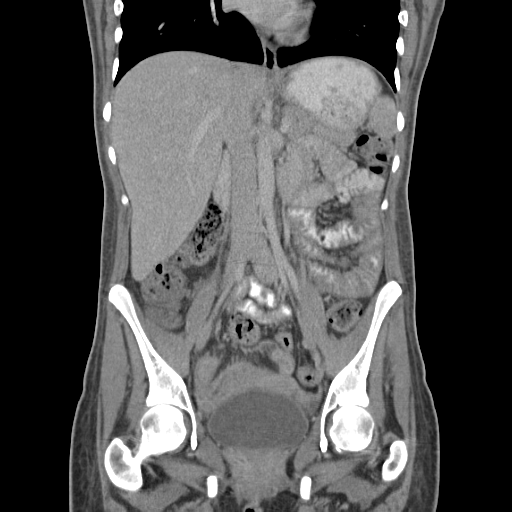

[17 of 46 positions shown; findings below may reference images not displayed]

FINDINGS: Gallbladder is decompressed

Liver, spleen, pancreas, adrenal glands, and kidneys are within
normal limits.

Extensive stool throughout the colon.

Stable mildly prominent right lower quadrant mesenteric lymph nodes.

Normal appendix.

The distal ileum has an abnormal appearance. There is mild
prominence of the wall with greater than what is expected to be
normal enhancement. There is prominent fat surrounding the distal
ileum. These findings suggest inflammatory bowel disease.

Trace free fluid in the pelvis.  Cystic changes in the ovaries.

Uterus and bladder are within normal limits.
IMPRESSION: Abnormal appearance of the distal ileum as described worrisome for
Crohn's disease.

## 2013-10-21 MED ORDER — IOHEXOL 300 MG/ML  SOLN
50.0000 mL | Freq: Once | INTRAMUSCULAR | Status: AC | PRN
  Administered 2013-10-21: 50 mL via ORAL

## 2013-10-21 MED ORDER — BOOST / RESOURCE BREEZE PO LIQD
1.0000 | Freq: Three times a day (TID) | ORAL | Status: DC
  Administered 2013-10-21 – 2013-10-23 (×4): 1 via ORAL

## 2013-10-21 MED ORDER — ONDANSETRON HCL 4 MG PO TABS: 4.0000 mg | ORAL_TABLET | Freq: Four times a day (QID) | ORAL | Status: DC | PRN

## 2013-10-21 MED ORDER — ACETAMINOPHEN 325 MG PO TABS
650.0000 mg | ORAL_TABLET | Freq: Four times a day (QID) | ORAL | Status: DC | PRN
  Administered 2013-10-21: 650 mg via ORAL
  Filled 2013-10-21: qty 2

## 2013-10-21 MED ORDER — ONDANSETRON HCL 4 MG/2ML IJ SOLN
4.0000 mg | Freq: Four times a day (QID) | INTRAMUSCULAR | Status: DC | PRN
  Administered 2013-10-23: 4 mg via INTRAVENOUS
  Filled 2013-10-21: qty 2

## 2013-10-21 MED ORDER — INFLUENZA VAC SPLIT QUAD 0.5 ML IM SUSP
0.5000 mL | INTRAMUSCULAR | Status: AC
  Administered 2013-10-22: 0.5 mL via INTRAMUSCULAR
  Filled 2013-10-21 (×3): qty 0.5

## 2013-10-21 MED ORDER — SODIUM CHLORIDE 0.9 % IV SOLN
INTRAVENOUS | Status: DC
  Administered 2013-10-21: 17:00:00 via INTRAVENOUS

## 2013-10-21 MED ORDER — IOHEXOL 300 MG/ML  SOLN
100.0000 mL | Freq: Once | INTRAMUSCULAR | Status: AC | PRN
  Administered 2013-10-21: 80 mL via INTRAVENOUS

## 2013-10-21 MED ORDER — PANTOPRAZOLE SODIUM 40 MG PO TBEC
40.0000 mg | DELAYED_RELEASE_TABLET | Freq: Two times a day (BID) | ORAL | Status: DC
  Administered 2013-10-21 – 2013-10-24 (×6): 40 mg via ORAL
  Filled 2013-10-21 (×10): qty 1

## 2013-10-21 MED ORDER — ADULT MULTIVITAMIN W/MINERALS CH
1.0000 | ORAL_TABLET | Freq: Every morning | ORAL | Status: DC
  Administered 2013-10-22 – 2013-10-24 (×3): 1 via ORAL
  Filled 2013-10-21 (×3): qty 1

## 2013-10-21 MED ORDER — MORPHINE SULFATE 2 MG/ML IJ SOLN
0.5000 mg | INTRAMUSCULAR | Status: DC | PRN
  Administered 2013-10-21 – 2013-10-23 (×3): 1 mg via INTRAVENOUS
  Filled 2013-10-21 (×3): qty 1

## 2013-10-21 MED ORDER — ONDANSETRON HCL 4 MG/2ML IJ SOLN: 4.0000 mg | Freq: Three times a day (TID) | INTRAMUSCULAR | Status: DC | PRN

## 2013-10-21 MED ORDER — SUCRALFATE 1 GM/10ML PO SUSP
1.0000 g | Freq: Four times a day (QID) | ORAL | Status: DC | PRN
  Administered 2013-10-23: 1 g via ORAL
  Filled 2013-10-21: qty 10

## 2013-10-21 MED ORDER — ACETAMINOPHEN 650 MG RE SUPP: 650.0000 mg | Freq: Four times a day (QID) | RECTAL | Status: DC | PRN

## 2013-10-21 NOTE — Consult Note (Signed)
Referring MD:   PCP:  No PCP Per Patient   Reason for Referral: Evaluate anemia, leukopenia, and weight loss   Chief Complaint  Patient presents with  . 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 Lab data recorded on 05/06/2012 showed a hemoglobin of 7.4, hematocrit 23, MCV 73, white count 8000 with 24% neutrophils, 35 bands, 14 lymphocytes, 20 monocytes, 3 myelocytes, 3 metamyelocytes, 1 promyelocyte, and platelet count 561,000. Normal liver function. CT scan of the abdomen and pelvis done 04/23/2012 with a repeat study done 05/06/2012. I have reviewed these images online. Findings included a small left ovarian cyst. "Mildly prominent right lower quadrant mesenteric lymph nodes". No bulky lymphadenopathy no splenomegaly. Focal fatty infiltration of the liver. Pancreas and gallbladder are normal. She was put on iron supplements one daily. She has not had any consistent medical followup until now when she presented to the [facility] emergency department with recurrent pain. Pain has never resolved. It is becoming more frequent and constant. It is associated with nausea and occasional vomiting. It is variably crampy, sharp, and burning. Sometimes worse after meals. No dysphagia. Positive early satiety. Zantac relieves the pain occasionally. Intermittent loose bowel movements. No hematochezia. Stool is dark from iron. Over the last month she has lost 15 pounds. She has developed profuse drenching night sweats and has had to change her bedclothes about 4 times each night. She has not checked her temperature.  She has no signs or symptoms of a collagen vascular disorder. Her knees sometimes ache but she is on her feet all day. No neurologic symptoms and she denies headache, change in vision, or paresthesias. Sometimes her feet do feel numb. She has not noted any swollen glands, no bruising or bleeding, no   epistaxis or hematuria. She's never had a blood transfusion. She has never been pregnant. She is sexually active but not for a number of months. She does not smoke, use alcohol, or use recreational drugs.   Past Medical History  Diagnosis Date  . Ovarian cyst   . Anemia   . Monocytosis 10/21/2013  :  Past Surgical History  Procedure Laterality Date  . No past surgeries    :  . [START ON 10/22/2013] influenza vac split quadrivalent PF  0.5 mL Intramuscular Tomorrow-1000  . [START ON 10/22/2013] multivitamin with minerals  1 tablet Oral q morning - 10a  . pantoprazole  40 mg Oral BID AC  :  Allergies  Allergen Reactions  . Benadryl [Diphenhydramine Hcl] Hives  . Penicillins Hives  :  History reviewed. No pertinent family history.:  History   Social History  . Marital Status: Single    Spouse Name: N/A    Number of Children: N/A  . Years of Education: N/A   Occupational History  . Not on file.   Social History Main Topics  . Smoking status: Never Smoker   . Smokeless tobacco: Never Used  . Alcohol Use: No  . Drug Use: No  . Sexual Activity: No   Other Topics Concern  . Not on file   Social History Narrative  . No narrative on file  :  ROS: See history of present illness   Vitals: Filed Vitals:   10/21/13 1445  BP: 108/67  Pulse: 73  Temp: 98.6 F (37 C)  Resp: 18    PHYSICAL EXAM: General appearance: Pleasant, Petite, well-nourished, African American woman, HEENT: She still has  her tonsils they're not inflamed. No erythema or exudate in the oropharynx. Lymph Nodes: No cervical, supraclavicular, axillary, or inguinal adenopathy Resp: Lungs clear to auscultation resonant to percussion Cardio: Regular rhythm no murmur gallop or click Vascular: Carotids 2+ no bruits, no cyanosis Breasts: GI: Abdomen is diffusely tender, normal bowel sounds, no obvious mass or organomegaly Rectal exam: Reported to me by emergency department physician was normal  today. Stools guaiac negative. GU: Extremities: No edema, no calf tenderness, no joint deformities Neurologic: She is alert and oriented, PERRLA, full extraocular movements, cranial nerves grossly normal, motor strength 5 over 5, reflexes 1+ symmetric, upper body coordination normal. Skin: No rash or ecchymosis  Labs:   Recent Labs  10/21/13 1030  WBC 3.1*  HGB 7.1*  HCT 24.1*  PLT 576*  White count differential: 41% neutrophils, 30% lymphocytes, 26% monocytes, 4 eosinophils, 1 basophil   Recent Labs  10/21/13 1030  NA 140  K 3.7  CL 102  CO2 26  GLUCOSE 79  BUN 7  CREATININE 0.50  CALCIUM 8.6  Urinalysis: No blood or protein   Blood smear review: pending  Images Studies/Results: No current studies. See discussion above        Assessment: Active Problems:   Anemia   Abdominal pain   Leukopenia   Monocytosis  Impression: #1. Chronic, microcytic anemia with associated thrombocytosis little changed over a two-year interval. This may just be a simple iron deficiency with a reactive thrombocytosis. However, weight loss, night sweats, unexplained progressive abdominal pain, monocytosis, new leukopenia, suggests that  there is another process going on which may be malignant. Monocytosis is nonspecific and can be seen with a number of different benign and malignant conditions including lymphoma, solid tumors, tuberculosis, collagen vascular disease, and certain bone marrow disorders. White count differential was grossly abnormal 2 years ago with presence of early myeloid forms. This has not been reported on the current differential. I will review a peripheral blood film when available.  #2. Weight loss, anemia, profuse night sweats, unexplained abdominal pain, monocytosis See discussion above #1. I'm concerned that she might have a malignant process going on.   Recommendation: I have ordered an iron panel with ferritin, viral tests including CMV, EBV, and HIV. In  view of the monocytosis I have ordered a quantiferon test for TB. I would get a chest x-ray. GI consultation is pending to consider endoscopy. I think it would be reasonable to repeat a CT scan of her abdomen and pelvis given her progressive symptoms.   GRANFORTUNA,JAMES M 10/21/2013, 4:20 PM

## 2013-10-21 NOTE — ED Notes (Signed)
Attempted to call report to [facility].  RN unavailable.

## 2013-10-21 NOTE — Progress Notes (Signed)
INITIAL NUTRITION ASSESSMENT  Pt meets criteria for severe MALNUTRITION in the context of chronic illness as evidenced by 15.8% weight loss in the past 6 months per pt's report in addition to pt with severe muscle wasting and subcutaneous fat loss in clavicles and upper arms with likely <75% estimated energy intake in the past month.  DOCUMENTATION CODES Per approved criteria  -Severe malnutrition in the context of chronic illness   INTERVENTION: - Resource Breeze TID - Diet advancement per MD - Will continue to monitor   NUTRITION DIAGNOSIS: Inadequate oral intake related to clear liquid diet as evidenced by diet order.   Goal: Advance diet as tolerated to regular diet  Monitor:  Weights, labs, diet advancement, nausea, abdominal pain   Reason for Assessment: Malnutrition screening tool   23 y.o. female  Admitting Dx: Abdominal pain and dizziness   ASSESSMENT: Pt with history of anemia ovarian cysts and intermittent abdominal pain since 2013 who presents with above complaints. She describes the abdominal pain as sharp, worse at night, usually across her mid abdomen, and has worsened over the past year. She states that initially she only had the pain once in a while but it gradually worsened to where she has been having it daily since at December 2014, and more severe. She admits to associated nausea all the time and vomits but rarely. She also reports that she's had loose stools are intermittently- mostly nonbloody although every now and then she gets a small streak of blood in her stool. Patient states that she's lost 15-20 pounds over the past 4 weeks. She states she has had fevers at times although not recently. She admits to dizziness and fatigue.  Met with pt and mother who report pt has been eating only 1-1.5 meals/day at home recently. Pt states her meals consist of either rice or pasta with chicken or fish and some kind of leafy green. Pt reports having abdominal pain within  5 minutes of eating or drinking. Reports 20 pound unintended weight loss in the past 6 months. Not tried any nutritional supplements at home. Pt c/o some weakness in lower extremities from poor intake and weight loss. Pt currently tolerating popsicle without any abdominal pain.   Nutrition Focused Physical Exam:  Subcutaneous Fat:  Orbital Region: WNL Upper Arm Region: severe wasting Thoracic and Lumbar Region: WNL  Muscle:  Temple Region: WNL Clavicle Bone Region: severe wasting Clavicle and Acromion Bone Region: severe wasting Scapular Bone Region: NA Dorsal Hand: mild/moderate wasting Patellar Region: mild/moderate wasting Anterior Thigh Region: mild/moderate wasting Posterior Calf Region: mild/moderate wasting  Edema: None noted    Height: Ht Readings from Last 1 Encounters:  10/21/13 5' 2"  (1.575 m)    Weight: Wt Readings from Last 1 Encounters:  10/21/13 106 lb (48.081 kg)    Ideal Body Weight: 110 lb   % Ideal Body Weight: 96%  Wt Readings from Last 10 Encounters:  10/21/13 106 lb (48.081 kg)  07/14/13 114 lb (51.71 kg)  05/06/12 125 lb (56.7 kg) (44%*, Z = -0.16)  04/23/12 125 lb (56.7 kg) (44%*, Z = -0.15)   * Growth percentiles are based on CDC 2-20 Years data.    Usual Body Weight: 126 lb per pt  % Usual Body Weight: 84%  BMI:  Body mass index is 19.38 kg/(m^2).  Estimated Nutritional Needs: Kcal: 1200-1450 Protein: 60-70g Fluid: 1.2-1.4L/day  Skin: Intact   Diet Order: Clear Liquid  EDUCATION NEEDS: -No education needs identified at this time  No  intake or output data in the 24 hours ending 10/21/13 1703  Last BM: 1/20   Labs:   Recent Labs Lab 10/21/13 1030  NA 140  K 3.7  CL 102  CO2 26  BUN 7  CREATININE 0.50  CALCIUM 8.6  GLUCOSE 79    CBG (last 3)  No results found for this basename: GLUCAP,  in the last 72 hours  Scheduled Meds: . [START ON 10/22/2013] influenza vac split quadrivalent PF  0.5 mL Intramuscular  Tomorrow-1000  . [START ON 10/22/2013] multivitamin with minerals  1 tablet Oral q morning - 10a  . pantoprazole  40 mg Oral BID AC    Continuous Infusions: . sodium chloride      Past Medical History  Diagnosis Date  . Ovarian cyst   . Anemia   . Monocytosis 10/21/2013    Past Surgical History  Procedure Laterality Date  . No past surgeries      Mikey College MS, RD, [place] Pager [phone] After Hours Pager

## 2013-10-21 NOTE — ED Notes (Signed)
Attempted to call back RN for report, no answer.

## 2013-10-21 NOTE — ED Notes (Signed)
Pt reports several months of abd pain, n/v/d.  Reports has lost weight, unsure of amount.

## 2013-10-21 NOTE — ED Notes (Signed)
PIV wrapped with conform for tx to additional hospital via pov.

## 2013-10-21 NOTE — H&P (Signed)
[facility] History and Physical  Beverly Richards WLN:[id number] DOB: July 24, 1991 DOA: 10/21/2013  Referring physician: EDP PCP: No PCP Per Patient   Chief Complaint: Abdominal pain and dizziness  HPI: Beverly Richards is a 23 y.o. female with history of anemia ovarian cysts and intermittent abdominal pain since 2013 who presents with above complaints. She describes the abdominal pain as sharp, worse at night, usually across her mid abdomen, and has worsened over the past year. She states that initially she only had the pain once in a while but it gradually worsened to where she has been having it daily since at December 2014, and more severe. She admits to associated nausea all the time and vomits but rarely. She also reports that she's had loose stools are intermittently- mostly nonbloody although every now and then she gets a small streak of blood in her stool. Patient states that she's lost 15-20 pounds over the past 4 weeks. She states she has had fevers at times although not recently. She was seen in the [facility] ED and urinalysis was negative, Hemoccult negative, hemoglobin 7.1 with normal MCV of 78.5, platelet count of 576, lipase 13. She admits to a dizziness, and fatigue. It was noted that patient had had a CT scan done August of 2013 which showed stable mildly dominant right lower quadrant mesenteric lymph nodes, and a small L. ovarian cyst noted (o/w no recent CT). Pt reports a h/o heavy periods. In the ED GI and hematology were consulted and [facility].    Review of Systems The patient denies , vision loss, decreased hearing, hoarseness, chest pain, syncope, dyspnea on exertion, peripheral edema, balance deficits, hemoptysis, melena, severe indigestion/heartburn, hematuria, incontinence, genital sores, muscle weakness, suspicious skin lesions, transient blindness, difficulty walking, depression, abnormal bleeding,  Past Medical History   Diagnosis Date  . Ovarian cyst   . Anemia    Past Surgical History  Procedure Laterality Date  . No past surgeries     Social History:  reports that she has never smoked. She has never used smokeless tobacco. She reports that she does not drink alcohol or use illicit drugs.  Allergies  Allergen Reactions  . Benadryl [Diphenhydramine Hcl] Hives  . Penicillins Hives    Family history-a grandmother with diabetes hypertension, other grandmother with breast cancer.  Prior to Admission medications   Medication Sig Start Date End Date Taking? Authorizing Provider  ferrous sulfate 325 (65 FE) MG tablet Take 325 mg by mouth daily with breakfast.   Yes Historical Provider, MD  Multiple Vitamin (MULTIVITAMIN WITH MINERALS) TABS tablet Take 1 tablet by mouth every morning.   Yes Historical Provider, MD  naproxen sodium (ANAPROX) 220 MG tablet Take 440 mg by mouth every 8 (eight) hours as needed (For headache, pain or fever.). For pain.   Yes Historical Provider, MD  OVER THE COUNTER MEDICATION Take 1 tablet by mouth daily as needed (For cold symptoms.). Airborne Gummies for Adults   Yes Historical Provider, MD  ranitidine (ZANTAC) 75 MG tablet Take 75 mg by mouth 2 (two) times daily as needed for heartburn.   Yes Historical Provider, MD  Simethicone (GAS-X PO) Take 1 tablet by mouth 4 (four) times daily as needed (For gas pain.).    Yes Historical Provider, MD  sucralfate (CARAFATE) 1 GM/10ML suspension Take 1 g by mouth 4 (four) times daily as needed (For stomach.).   Yes Historical Provider, MD   Physical Exam: Filed Vitals:  10/21/13 1445  BP: 108/67  Pulse: 73  Temp: 98.6 F (37 C)  Resp: 18    BP 108/67  Pulse 73  Temp(Src) 98.6 F (37 C) (Oral)  Resp 18  Ht 5\' 2"  (1.575 m)  Wt 48.081 kg (106 lb)  BMI 19.38 kg/m2  SpO2 100%  LMP 09/27/2013 Constitutional: Vital signs reviewed.  Patient is a well-developed, thin-appearing young female in no acute distress and cooperative  with exam. Alert and oriented x3.  Head: Normocephalic and atraumatic Mouth: no erythema or exudates, slightly dry MM Eyes: PERRL, EOMI, conjunctivae normal, No scleral icterus.  Neck: Supple, Trachea midline normal ROM, No JVD, mass, thyromegaly, or carotid bruit present.  Cardiovascular: RRR, S1 normal, S2 normal, no MRG, pulses symmetric and intact bilaterally Pulmonary/Chest: normal respiratory effort, CTAB, no wheezes, rales, or rhonchi Abdominal: Soft. Diffuse tenderness, no rebound, non-distended, bowel sounds are normal, no masses, organomegaly, or guarding present.  Neurological: A&O x3, Strength is normal and symmetric bilaterally, cranial nerve II-XII are grossly intact, no focal motor deficit, sensory intact to light touch bilaterally.  Skin: Warm, dry and intact. No rash.  Psychiatric: Normal mood and affect. speech and behavior is normal. Judgment and thought content normal. Cognition and memory are normal.                Labs on Admission:  Basic Metabolic Panel:  Recent Labs Lab 10/21/13 1030  NA 140  K 3.7  CL 102  CO2 26  GLUCOSE 79  BUN 7  CREATININE 0.50  CALCIUM 8.6   Liver Function Tests:  Recent Labs Lab 10/21/13 1030  AST 13  ALT 22  ALKPHOS 105  BILITOT 0.3  PROT 6.7  ALBUMIN 2.0*    Recent Labs Lab 10/21/13 1030  LIPASE 13   No results found for this basename: AMMONIA,  in the last 168 hours CBC:  Recent Labs Lab 10/21/13 1030  WBC 3.1*  NEUTROABS 1.3*  HGB 7.1*  HCT 24.1*  MCV 78.5  PLT 576*   Cardiac Enzymes: No results found for this basename: CKTOTAL, CKMB, CKMBINDEX, TROPONINI,  in the last 168 hours  BNP (last 3 results) No results found for this basename: PROBNP,  in the last 8760 hours CBG: No results found for this basename: GLUCAP,  in the last 168 hours  Radiological Exams on Admission: No results found.   Assessment/Plan Active Problems:  Abdominal pain -As discussed above intermittent/worsening  since 2013 and she also has anemia, thrombocytosis with white cell abnormality. Also noted are reported 15 pound weight loss. -Will repeat CT scan to further eval -Urinalysis and lipase are negative, her T. bili/LFTs are within normal limits -Will also add LDH, haptoglobin and follow -GI and onc consulted for further recommendations   Symptomatic Anemia -Obtain anemia panel, Hemoccult -She admits to menorrhagia, follow up on above studies and consider pelvic US if above neg -GI consulted for further recommendations  Leukopenia -Follow recheck, she is afebrile and hemodynamically stable at this time -Oncology consulted for further recommendations Thrombocytosis -May be reactive -Onc consulted for further recs, also seen sometimes with [facility] - follow above studies.     Code Status: full  Family Communication: mother at bedside Disposition Plan: admit to tele  Time spent: >59mins  [facility] Pager [phone]

## 2013-10-21 NOTE — Progress Notes (Signed)
23 y/o With multiple evaluations in the ED for abdominal discomfort and history of anemia.  Who presented to the ED complaining of abdominal discomfort.  ED physician contacted GI who requests admission for further evaluation and recommendations from their standpoint. Also given abnormalities noted on cbc along with history of mesenteric lymphadenopathy hematology consult requested as well. Ed physician to contact oncology/hematology for further recommendations from their standpoint.  Patient will be admitted to [facility] team 8 to a med surg bed.  Our team will evaluate once patient arrives.  Jennife Zaucha, [employer]

## 2013-10-21 NOTE — ED Notes (Signed)
MD at bedside to discuss plan of care

## 2013-10-21 NOTE — ED Notes (Signed)
MD at bedside. 

## 2013-10-21 NOTE — Progress Notes (Signed)
Patient arrived to the floor from [facility] ED.  Patient was transported to [place] by her mother, who is at bedside.  Patient complains of abdominal pain that has been going on for several weeks.  [facility] was notified of patient's admission to the floor.  Patient was oriented to the department and denies further questions.

## 2013-10-21 NOTE — Consult Note (Addendum)
[facility] Consult Note  Referring Provider: No ref. provider found Primary Care Physician:  No PCP Per Patient Primary Gastroenterologist:  Dr.  Laurel Dimmer Complaint: Abdominal pain and weight loss HPI: Beverly Richards is an 23 y.o. black female  admitted with worsening generalized abdominal pain associated with a 16 pound weight loss, alternating constipation and diarrhea and he severe anemia with a hemoglobin of 7. She also was found to have thrombocytosis and mild neutropenia as well as a low albumin. She had an abdominal CT scan in 05/2012 which showed some RLQ mesenteric adenopathy and was otherwise apparently unrevealing. She has had occasional nausea and vomiting but this is been fairly infrequent. She has not had any bloody stools. She has had 1 guaiac-negative stool here. She denies any recent travel or recent antibiotics. Her menstrual periods have been on time. She has a history of an ovarian and otherwise no significant past medical or surgical history.  Past Medical History  Diagnosis Date  . Ovarian cyst   . Anemia   . Monocytosis 10/21/2013    Past Surgical History  Procedure Laterality Date  . No past surgeries      Medications Prior to Admission  Medication Sig Dispense Refill  . ferrous sulfate 325 (65 FE) MG tablet Take 325 mg by mouth daily with breakfast.      . Multiple Vitamin (MULTIVITAMIN WITH MINERALS) TABS tablet Take 1 tablet by mouth every morning.      . naproxen sodium (ANAPROX) 220 MG tablet Take 440 mg by mouth every 8 (eight) hours as needed (For headache, pain or fever.). For pain.      Marland Kitchen OVER THE COUNTER MEDICATION Take 1 tablet by mouth daily as needed (For cold symptoms.). Airborne Gummies for Adults      . ranitidine (ZANTAC) 75 MG tablet Take 75 mg by mouth 2 (two) times daily as needed for heartburn.      . Simethicone (GAS-X PO) Take 1 tablet by mouth 4 (four) times daily as needed (For gas pain.).       Marland Kitchen sucralfate (CARAFATE) 1  GM/10ML suspension Take 1 g by mouth 4 (four) times daily as needed (For stomach.).        Allergies:  Allergies  Allergen Reactions  . Benadryl [Diphenhydramine Hcl] Hives  . Penicillins Hives    History reviewed. No pertinent family history.  Social History:  reports that she has never smoked. She has never used smokeless tobacco. She reports that she does not drink alcohol or use illicit drugs.  Review of Systems: negative except as above   Blood pressure 108/67, pulse 73, temperature 98.6 F (37 C), temperature source Oral, resp. rate 18, height 5' 2"  (1.575 m), weight 48.081 kg (106 lb), last menstrual period 09/27/2013, SpO2 100.00%. Head: Normocephalic, without obvious abnormality, atraumatic Neck: no adenopathy, no carotid bruit, no JVD, supple, symmetrical, trachea midline and thyroid not enlarged, symmetric, no tenderness/mass/nodules Resp: clear to auscultation bilaterally Cardio: regular rate and rhythm, S1, S2 normal, no murmur, click, rub or gallop GI: Abdomen soft diffusely tender no focality, no organomegaly mass or guarding Extremities: extremities normal, atraumatic, no cyanosis or edema  Results for orders placed during the hospital encounter of 10/21/13 (from the past 48 hour(s))  COMPREHENSIVE METABOLIC PANEL     Status: Abnormal   Collection Time    10/21/13 10:30 AM      Result Value Range   Sodium 140  137 - 147 mEq/L   Potassium 3.7  3.7 -  5.3 mEq/L   Chloride 102  96 - 112 mEq/L   CO2 26  19 - 32 mEq/L   Glucose, Bld 79  70 - 99 mg/dL   BUN 7  6 - 23 mg/dL   Creatinine, Ser 0.50  0.50 - 1.10 mg/dL   Calcium 8.6  8.4 - 10.5 mg/dL   Total Protein 6.7  6.0 - 8.3 g/dL   Albumin 2.0 (*) 3.5 - 5.2 g/dL   AST 13  0 - 37 U/L   ALT 22  0 - 35 U/L   Alkaline Phosphatase 105  39 - 117 U/L   Total Bilirubin 0.3  0.3 - 1.2 mg/dL   GFR calc non Af Amer >90  >90 mL/min   GFR calc Af Amer >90  >90 mL/min   Comment: (NOTE)     The eGFR has been calculated  using the CKD EPI equation.     This calculation has not been validated in all clinical situations.     eGFR's persistently <90 mL/min signify possible Chronic Kidney     Disease.  CBC WITH DIFFERENTIAL     Status: Abnormal   Collection Time    10/21/13 10:30 AM      Result Value Range   WBC 3.1 (*) 4.0 - 10.5 K/uL   RBC 3.07 (*) 3.87 - 5.11 MIL/uL   Hemoglobin 7.1 (*) 12.0 - 15.0 g/dL   HCT 24.1 (*) 36.0 - 46.0 %   MCV 78.5  78.0 - 100.0 fL   MCH 23.1 (*) 26.0 - 34.0 pg   MCHC 29.5 (*) 30.0 - 36.0 g/dL   RDW 19.2 (*) 11.5 - 15.5 %   Platelets 576 (*) 150 - 400 K/uL   Neutrophils Relative % 41 (*) 43 - 77 %   Neutro Abs 1.3 (*) 1.7 - 7.7 K/uL   Lymphocytes Relative 30  12 - 46 %   Lymphs Abs 0.9  0.7 - 4.0 K/uL   Monocytes Relative 26 (*) 3 - 12 %   Monocytes Absolute 0.8  0.1 - 1.0 K/uL   Eosinophils Relative 4  0 - 5 %   Eosinophils Absolute 0.1  0.0 - 0.7 K/uL   Basophils Relative 1  0 - 1 %   Basophils Absolute 0.0  0.0 - 0.1 K/uL  LIPASE, BLOOD     Status: None   Collection Time    10/21/13 10:30 AM      Result Value Range   Lipase 13  11 - 59 U/L  URINALYSIS, ROUTINE W REFLEX MICROSCOPIC     Status: None   Collection Time    10/21/13 11:10 AM      Result Value Range   Color, Urine YELLOW  YELLOW   APPearance CLEAR  CLEAR   Specific Gravity, Urine 1.010  1.005 - 1.030   pH 6.5  5.0 - 8.0   Glucose, UA NEGATIVE  NEGATIVE mg/dL   Hgb urine dipstick NEGATIVE  NEGATIVE   Bilirubin Urine NEGATIVE  NEGATIVE   Ketones, ur NEGATIVE  NEGATIVE mg/dL   Protein, ur NEGATIVE  NEGATIVE mg/dL   Urobilinogen, UA 0.2  0.0 - 1.0 mg/dL   Nitrite NEGATIVE  NEGATIVE   Leukocytes, UA NEGATIVE  NEGATIVE   Comment: MICROSCOPIC NOT DONE ON URINES WITH NEGATIVE PROTEIN, BLOOD, LEUKOCYTES, NITRITE, OR GLUCOSE <1000 mg/dL.  PREGNANCY, URINE     Status: None   Collection Time    10/21/13 11:10 AM      Result Value Range  Preg Test, Ur NEGATIVE  NEGATIVE  OCCULT BLOOD X 1 CARD TO LAB,  STOOL     Status: None   Collection Time    10/21/13 11:10 AM      Result Value Range   Fecal Occult Bld NEGATIVE  NEGATIVE   No results found.  Assessment: 1. Abdominal pain 2. Weight loss 3. Anemia 4. Thrombocytopenia and neutropenia 5. Mesenteric adenopathy on CT scan. Plan:  Differential diagnoses is broad it includes both gastrointestinal and hematologic disorders. I agree with hematologist recommendation for CT scan. With the severe anemia weight loss and preponderance of abdominal pain as primary symptom if CT of the abdomen is unrevealing or suggests  a GI etiology I would be in favor beginning with an EGD and/or colonoscopy with biopsies  Will await results of CT scan and reassess.Marland Kitchen Kamillah Didonato C 10/21/2013, 4:19 PM

## 2013-10-21 NOTE — ED Provider Notes (Signed)
CSN: [id number]     Arrival date & time 10/21/13  1004 History   First MD Initiated Contact with Patient 10/21/13 1009     Chief Complaint  Patient presents with  . Abdominal Pain   HPI Patient presents with progressively worsening abdominal pain which is made worse after she eats.  She also had a 15-20 pound weight loss over the last 6 months.  Been seen here for similar complaints in the past.  Abdominal CT switch showed mesenteric lymphadenopathy.  Also had some cysts on her ovaries. Past Medical History  Diagnosis Date  . Ovarian cyst   . Anemia    History reviewed. No pertinent past surgical history. No family history on file. History  Substance Use Topics  . Smoking status: Never Smoker   . Smokeless tobacco: Never Used  . Alcohol Use: No   OB History   Grav Para Term Preterm Abortions TAB SAB Ect Mult Living                 Review of Systems All other systems reviewed and are negative Allergies  Benadryl and Penicillins  Home Medications   Current Outpatient Rx  Name  Route  Sig  Dispense  Refill  . ferrous sulfate 325 (65 FE) MG tablet   Oral   Take 325 mg by mouth daily with breakfast.         . HYDROcodone-acetaminophen (NORCO/VICODIN) 5-325 MG per tablet      Take 1-2 tabs by mouth every 6 hours when necessary pain.   15 tablet   0   . Multiple Vitamin (MULTIVITAMIN) tablet   Oral   Take 1 tablet by mouth daily.         . naproxen sodium (ANAPROX) 220 MG tablet   Oral   Take 440 mg by mouth 2 (two) times daily with a meal. For pain.         . nitrofurantoin, macrocrystal-monohydrate, (MACROBID) 100 MG capsule   Oral   Take 1 capsule (100 mg total) by mouth 2 (two) times daily.   10 capsule   0   . ranitidine (ZANTAC) 150 MG tablet   Oral   Take 1 tablet (150 mg total) by mouth 2 (two) times daily.   28 tablet   0   . sucralfate (CARAFATE) 1 GM/10ML suspension   Oral   Take 10 mLs (1 g total) by mouth 4 (four) times daily.   420 mL   0   . sulfamethoxazole-trimethoprim (SEPTRA DS) 800-160 MG per tablet   Oral   Take 1 tablet by mouth 2 (two) times daily.          BP 107/74  Pulse 70  Temp(Src) 97.7 F (36.5 C) (Oral)  Resp 16  Ht 5\' 2"  (1.575 m)  Wt 106 lb 11.2 oz (48.399 kg)  BMI 19.51 kg/m2  SpO2 100% Physical Exam  Nursing note and vitals reviewed. Constitutional: She is oriented to person, place, and time. She appears well-developed and well-nourished. No distress.  HENT:  Head: Normocephalic and atraumatic.  Eyes: Pupils are equal, round, and reactive to light.  Neck: Normal range of motion.  Cardiovascular: Normal rate and intact distal pulses.   Pulmonary/Chest: No respiratory distress.  Abdominal: Normal appearance. She exhibits no distension. There is tenderness. There is no rebound (diffuse) and no guarding.  Musculoskeletal: Normal range of motion.  Neurological: She is alert and oriented to person, place, and time. No cranial nerve deficit.  Skin: Skin  is warm and dry. No rash noted.  Psychiatric: She has a normal mood and affect. Her behavior is normal.    ED Course  Procedures (including critical care time)  Review of abdominal CT is done in the past shows mesenteric lymphadenopathy. Labs Review Labs Reviewed  COMPREHENSIVE METABOLIC PANEL - Abnormal; Notable for the following:    Albumin 2.0 (*)    All other components within normal limits  CBC WITH DIFFERENTIAL - Abnormal; Notable for the following:    WBC 3.1 (*)    RBC 3.07 (*)    Hemoglobin 7.1 (*)    HCT 24.1 (*)    MCH 23.1 (*)    MCHC 29.5 (*)    RDW 19.2 (*)    Platelets 576 (*)    Neutrophils Relative % 41 (*)    Neutro Abs 1.3 (*)    Monocytes Relative 26 (*)    All other components within normal limits  LIPASE, BLOOD  URINALYSIS, ROUTINE W REFLEX MICROSCOPIC  PREGNANCY, URINE  OCCULT BLOOD X 1 CARD TO LAB, STOOL   Imaging Review No results found.    MDM   1. Abdominal pain   2. Anemia   3.  Thrombocytosis   4. Leukopenia      I spoke with Dr. Amedeo Plenty of gastroenterology and Dr.Granfortuna of hematology.  Patient will be admitted to [place] bed at [facility].Dr. Bega accepting physician.  Dot Lanes, MD 10/21/13 1235

## 2013-10-21 NOTE — Progress Notes (Signed)
critiCRITICAL VALUE ALERT  Critical value received:  Hgb 6.9  Date of notification:  10/21/13  Time of notification:  3762  Critical value read back:yes  Nurse who received alert:  Graciela Husbands, RN  MD notified (1st page):  Dr. Dillard Essex  Time of first page:  [phone]  MD notified (2nd page):  Time of second page:  Responding MD:  Dillard Essex  Time MD responded:  [phone]

## 2013-10-22 ENCOUNTER — Inpatient Hospital Stay (HOSPITAL_COMMUNITY): Payer: Medicaid Other

## 2013-10-22 DIAGNOSIS — D72821 Monocytosis (symptomatic): Secondary | ICD-10-CM

## 2013-10-22 DIAGNOSIS — E43 Unspecified severe protein-calorie malnutrition: Secondary | ICD-10-CM | POA: Insufficient documentation

## 2013-10-22 LAB — CBC WITH DIFFERENTIAL/PLATELET
BASOS PCT: 1 % (ref 0–1)
Basophils Absolute: 0 10*3/uL (ref 0.0–0.1)
EOS ABS: 0.1 10*3/uL (ref 0.0–0.7)
Eosinophils Relative: 3 % (ref 0–5)
HCT: 31.6 % — ABNORMAL LOW (ref 36.0–46.0)
HEMOGLOBIN: 10.2 g/dL — AB (ref 12.0–15.0)
Lymphocytes Relative: 22 % (ref 12–46)
Lymphs Abs: 0.9 10*3/uL (ref 0.7–4.0)
MCH: 25.3 pg — ABNORMAL LOW (ref 26.0–34.0)
MCHC: 32.3 g/dL (ref 30.0–36.0)
MCV: 78.4 fL (ref 78.0–100.0)
Monocytes Absolute: 1 10*3/uL (ref 0.1–1.0)
Monocytes Relative: 23 % — ABNORMAL HIGH (ref 3–12)
Neutro Abs: 2.2 10*3/uL (ref 1.7–7.7)
Neutrophils Relative %: 51 % (ref 43–77)
Platelets: 513 10*3/uL — ABNORMAL HIGH (ref 150–400)
RBC: 4.03 MIL/uL (ref 3.87–5.11)
RDW: 17.1 % — ABNORMAL HIGH (ref 11.5–15.5)
WBC: 4.2 10*3/uL (ref 4.0–10.5)

## 2013-10-22 LAB — SEDIMENTATION RATE: Sed Rate: 49 mm/hr — ABNORMAL HIGH (ref 0–22)

## 2013-10-22 LAB — HEMOGLOBIN AND HEMATOCRIT, BLOOD
HCT: 31.3 % — ABNORMAL LOW (ref 36.0–46.0)
Hemoglobin: 9.7 g/dL — ABNORMAL LOW (ref 12.0–15.0)

## 2013-10-22 LAB — OCCULT BLOOD X 1 CARD TO LAB, STOOL: FECAL OCCULT BLD: POSITIVE — AB

## 2013-10-22 LAB — FERRITIN: Ferritin: 37 ng/mL (ref 10–291)

## 2013-10-22 LAB — TSH: TSH: 1.476 u[IU]/mL (ref 0.350–4.500)

## 2013-10-22 LAB — EBV AB TO VIRAL CAPSID AG PNL, IGG+IGM: EBV VCA IgG: 89 U/mL — ABNORMAL HIGH (ref <18.0)

## 2013-10-22 LAB — VITAMIN B12: Vitamin B-12: 524 pg/mL (ref 211–911)

## 2013-10-22 LAB — HAPTOGLOBIN: Haptoglobin: 322 mg/dL — ABNORMAL HIGH (ref 45–215)

## 2013-10-22 LAB — FOLATE

## 2013-10-22 IMAGING — CR DG CHEST 2V
2 series · 2 of 2 positions shown · non-contrast
Comparison: None.

CLINICAL DATA: Dizziness and fatigue.  Leukopenia.

EXAM:
CHEST  2 VIEW

[w chest pa]
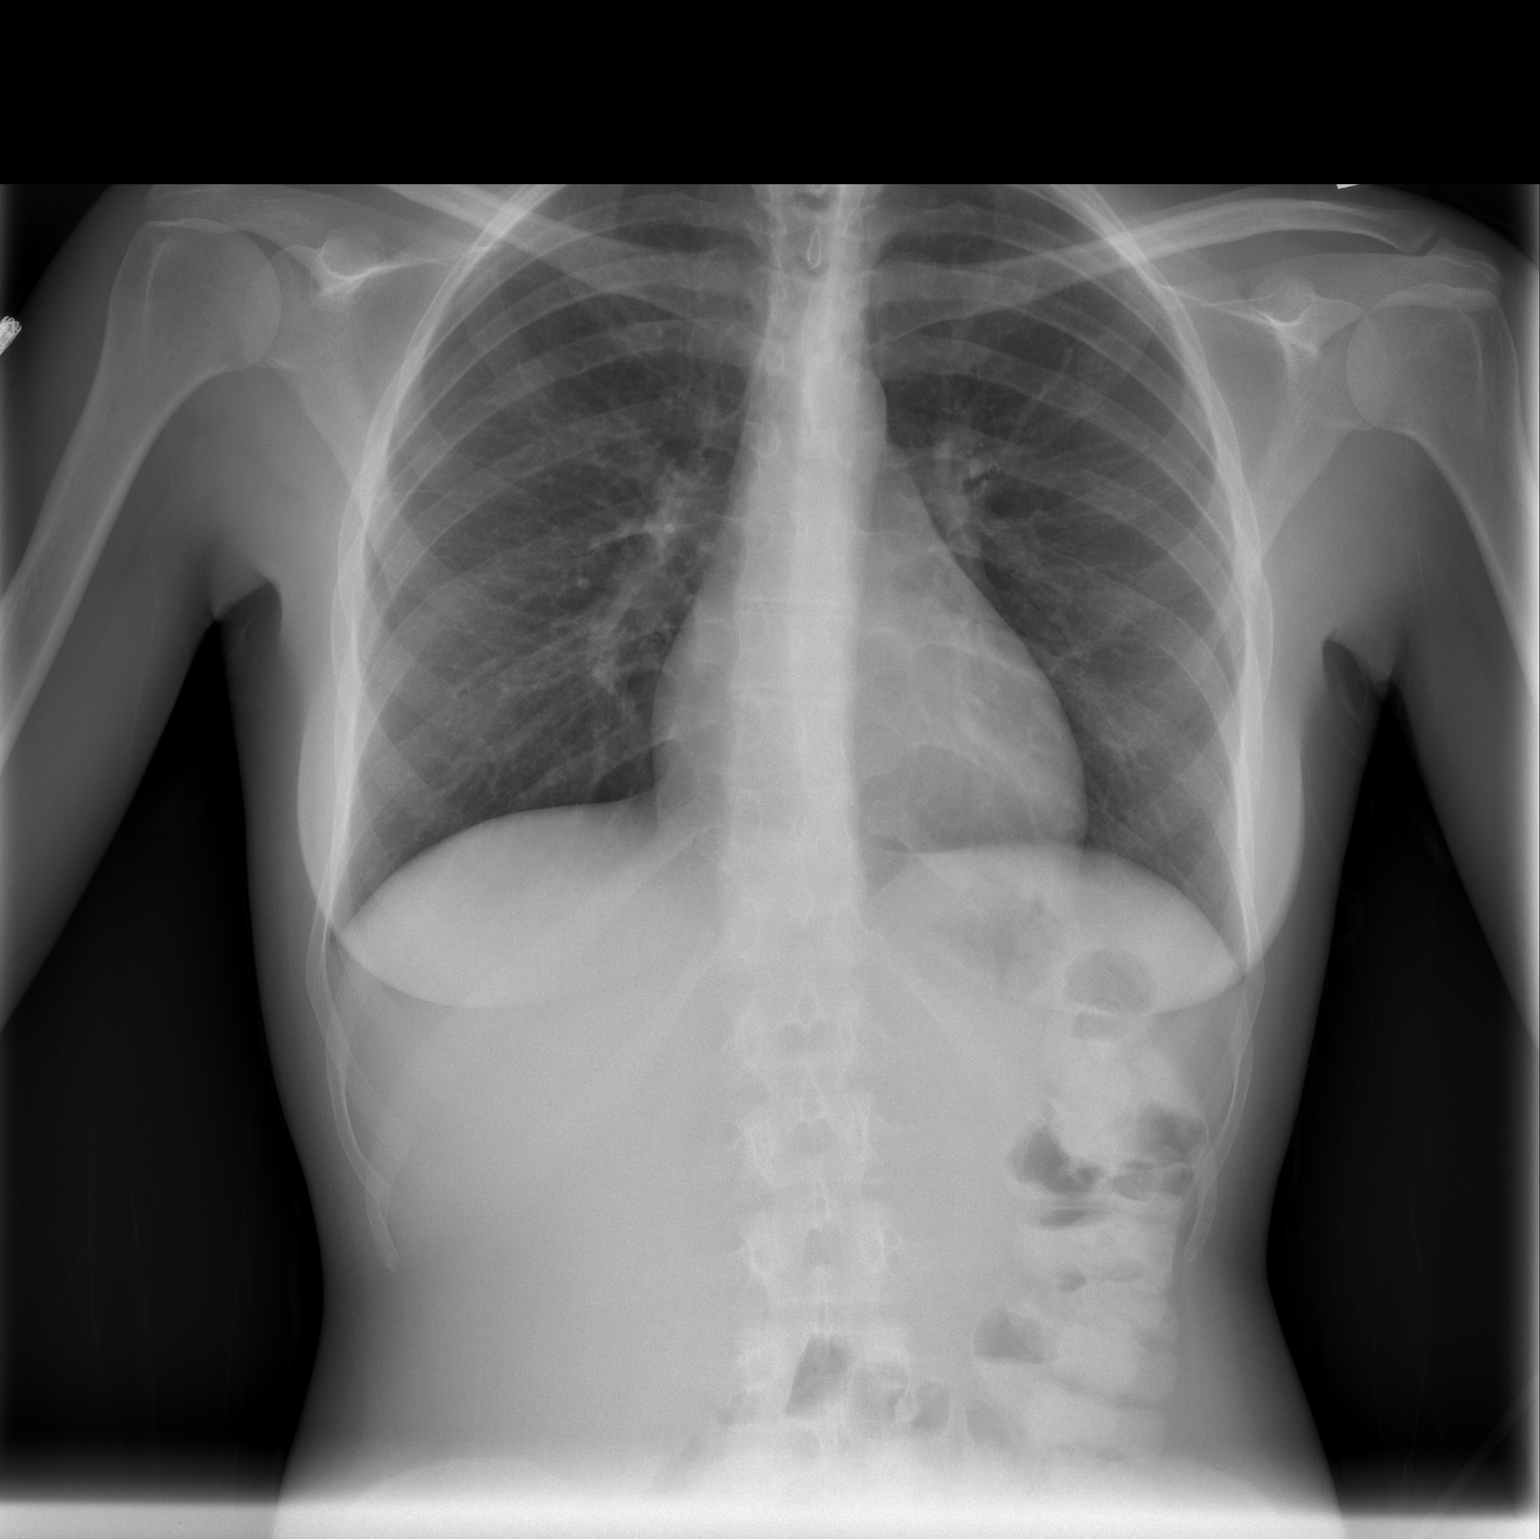

[w chest lat]
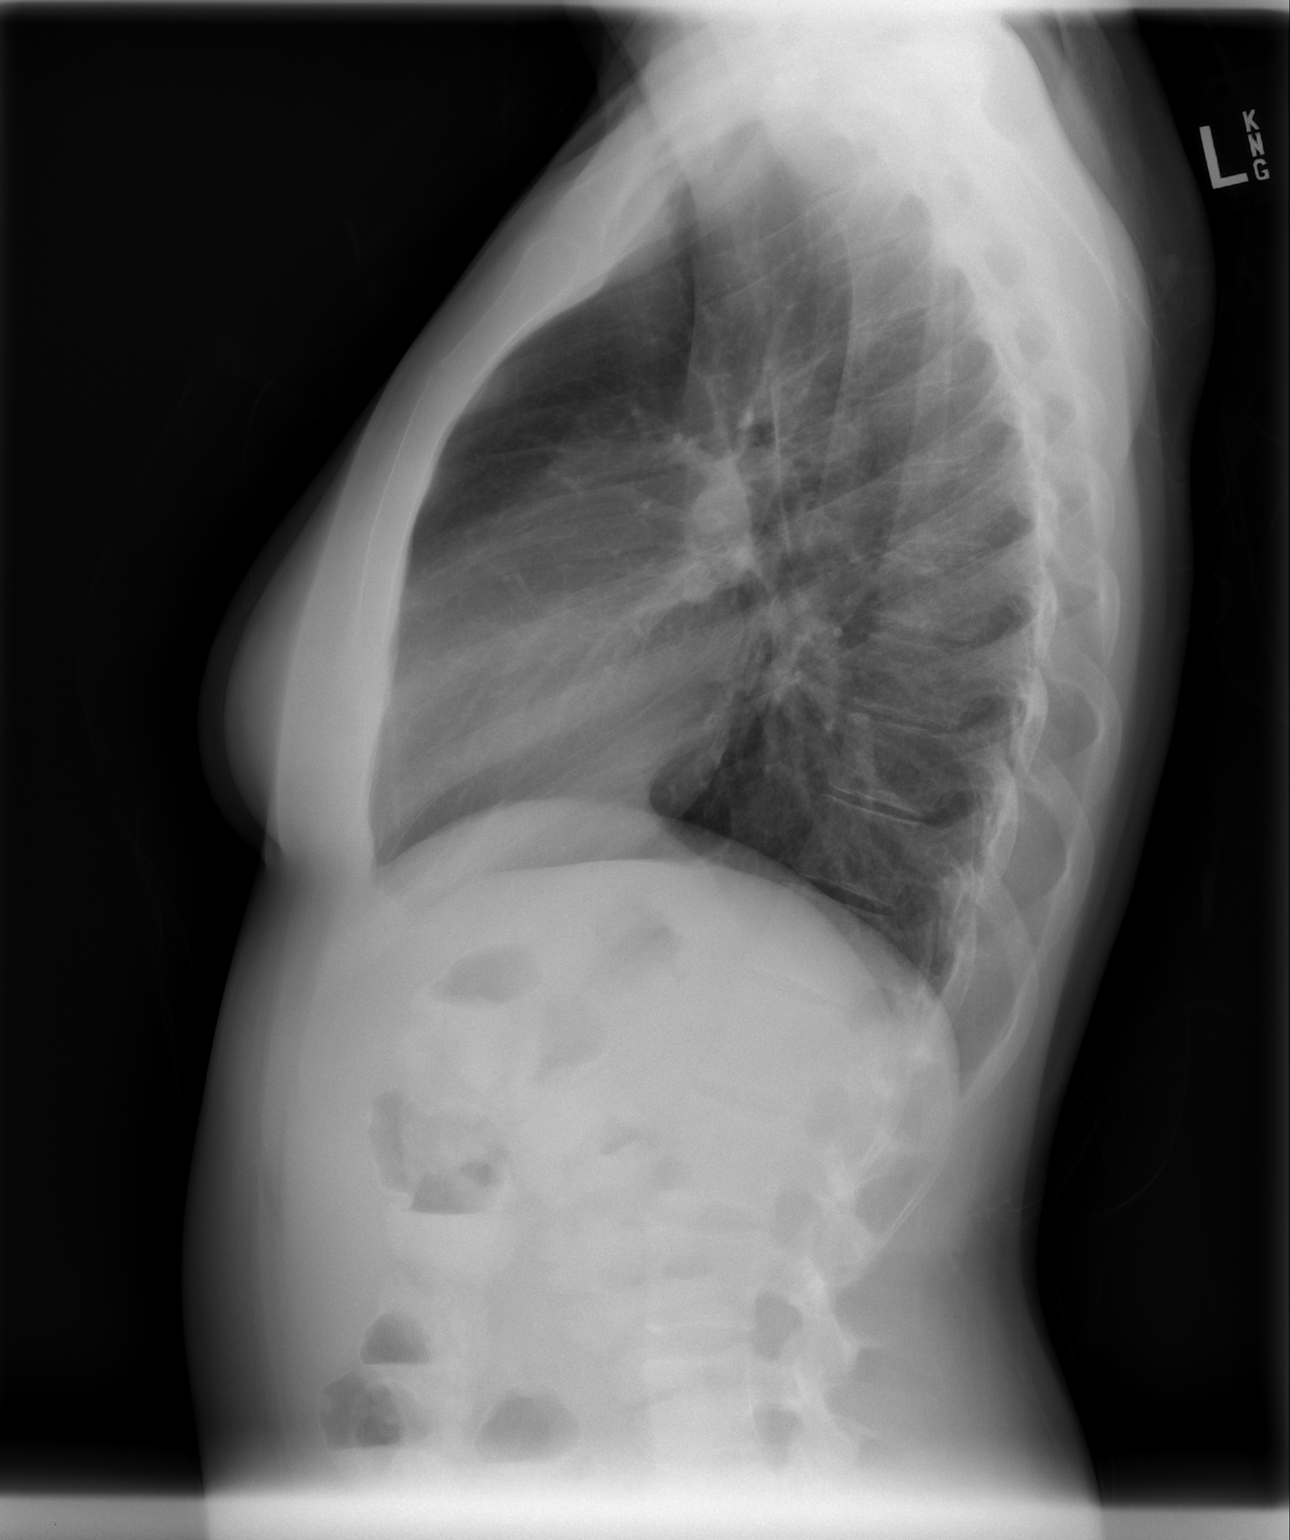

[2 of 2 positions shown; findings below may reference images not displayed]

FINDINGS: The heart size and mediastinal contours are within normal limits.
Both lungs are clear. There is a slight thoracolumbar scoliosis.
IMPRESSION: No significant abnormalities.

## 2013-10-22 MED ORDER — SODIUM CHLORIDE 0.9 % IV SOLN
INTRAVENOUS | Status: DC
  Administered 2013-10-23: 23:00:00 via INTRAVENOUS
  Administered 2013-10-23: 500 mL via INTRAVENOUS

## 2013-10-22 MED ORDER — PEG 3350-KCL-NA BICARB-NACL 420 G PO SOLR
4000.0000 mL | Freq: Once | ORAL | Status: AC
  Administered 2013-10-22: 4000 mL via ORAL

## 2013-10-22 NOTE — Progress Notes (Signed)
[facility] PROGRESS NOTE  Beverly Richards DGL:[id number] DOB: June 28, 1991 DOA: 10/21/2013 PCP: No PCP Per Patient  Assessment/Plan: Abdominal pain with abnormal CT abdomen and pelvis -10/21/2013 CT abdomen showed extensive stool with mild prominence of distal ileum--concerning for inflammatory bowel disease -Appreciate Dr. Amedeo Richards -plans noted for colonoscopy on 10/22/13 -Hepatic panel and lipase negative Mesenteric lymphadenopathy -May be reactive due to IBD -will need surveillance imaging Iron deficiency anemia -Likely contributing to the patient's thrombocytosis -Ultimately need to start ferrous sulfate -Received 2 units PRBCs--10/21/2013 - LDH and haptoglobin do not suggest hemolytic process Leukopenia -HIV test negative -obtain CMV DNA by PCR -obtain EBV DNA by PCR -appreciate Dr. Beryle Richards followup -obtain CXR  Family Communication:   mother at beside Disposition Plan:   Home when medically stable       Procedures/Studies: Ct Abdomen Pelvis W Contrast  10/21/2013   CLINICAL DATA:  Chronic lower abdominal pain  EXAM: CT ABDOMEN AND PELVIS WITH CONTRAST  TECHNIQUE: Multidetector CT imaging of the abdomen and pelvis was performed using the standard protocol following bolus administration of intravenous contrast.  CONTRAST:  59mL OMNIPAQUE IOHEXOL 300 MG/ML SOLN, 41mL OMNIPAQUE IOHEXOL 300 MG/ML SOLN  COMPARISON:  CT ABD/PELVIS W CM dated 05/06/2012  FINDINGS: Gallbladder is decompressed  Liver, spleen, pancreas, adrenal glands, and kidneys are within normal limits.  Extensive stool throughout the colon.  Stable mildly prominent right lower quadrant mesenteric lymph nodes.  Normal appendix.  The distal ileum has an abnormal appearance. There is mild prominence of the wall with greater than what is expected to be normal enhancement. There is prominent fat surrounding the distal ileum. These findings suggest inflammatory bowel disease.  Trace free fluid in the pelvis.  Cystic  changes in the ovaries.  Uterus and bladder are within normal limits.  IMPRESSION: Abnormal appearance of the distal ileum as described worrisome for Crohn's disease.   Electronically Signed   By: Beverly Richards M.D.   On: 10/21/2013 19:52         Subjective: Persistent abdominal pain is a little bit better. It is a dull achy sensation today. Denies any nausea, vomiting, chest pain, shortness breath, fevers, chills, coughing, dysuria, hematuria.  Objective: Filed Vitals:   10/22/13 0530 10/22/13 0629 10/22/13 0730 10/22/13 0830  BP: 103/66 98/62 102/65 115/78  Pulse: 86 81 63 74  Temp: 99.2 F (37.3 C) 98.8 F (37.1 C) 98.5 F (36.9 C) 98.7 F (37.1 C)  TempSrc: Oral Oral Oral Oral  Resp: 18 18 16 16   Height:      Weight:      SpO2: 98% 100% 100% 98%    Intake/Output Summary (Last 24 hours) at 10/22/13 1319 Last data filed at 10/22/13 0830  Gross per 24 hour  Intake 1562.33 ml  Output      0 ml  Net 1562.33 ml   Weight change:  Exam:   General:  Pt is alert, follows commands appropriately, not in acute distress  HEENT: No icterus, No thrush, NNC/AT  Cardiovascular: RRR, S1/S2, no rubs, no gallops  Respiratory: CTA bilaterally, no wheezing, no crackles, no rhonchi  Abdomen: Soft/+BS, non tender, non distended, no guarding  Extremities: No edema, No lymphangitis, No petechiae, No rashes, no synovitis  Data Reviewed: Basic Metabolic Panel:  Recent Labs Lab 10/21/13 1030  NA 140  K 3.7  CL 102  CO2 26  GLUCOSE 79  BUN 7  CREATININE 0.50  CALCIUM 8.6   Liver Function Tests:  Recent Labs Lab 10/21/13  1030  AST 13  ALT 22  ALKPHOS 105  BILITOT 0.3  PROT 6.7  ALBUMIN 2.0*    Recent Labs Lab 10/21/13 1030  LIPASE 13   No results found for this basename: AMMONIA,  in the last 168 hours CBC:  Recent Labs Lab 10/21/13 1030 10/21/13 1652 10/21/13 2212 10/22/13 1000  WBC 3.1* 3.0*  --  4.2  NEUTROABS 1.3* 1.4*  --  2.2  HGB 7.1* 6.9* 6.0*  10.2*  HCT 24.1* 23.1* 19.9* 31.6*  MCV 78.5 77.8*  --  78.4  PLT 576* 575*  --  513*   Cardiac Enzymes: No results found for this basename: CKTOTAL, CKMB, CKMBINDEX, TROPONINI,  in the last 168 hours BNP: No components found with this basename: POCBNP,  CBG: No results found for this basename: GLUCAP,  in the last 168 hours  No results found for this or any previous visit (from the past 240 hour(s)).   Scheduled Meds: . feeding supplement (RESOURCE BREEZE)  1 Container Oral TID BM  . multivitamin with minerals  1 tablet Oral q morning - 10a  . pantoprazole  40 mg Oral BID AC  . polyethylene glycol-electrolytes  4,000 mL Oral Once   Continuous Infusions: . sodium chloride 100 mL/hr at 10/21/13 1700  . sodium chloride 20 mL/hr at 10/22/13 0900     Beverly Micheli, DO  [facility] Pager [phone]  If 7PM-7AM, please contact night-coverage [email or web address] Password [email or web address] 10/22/2013, 1:19 PM   LOS: 1 day

## 2013-10-22 NOTE — Progress Notes (Signed)
No evidence for a hemolytic process with normal LDH and increased haptoglobin Suspect some element of iron deficiency.  Serum iron 10.  Ferritin may be disproportionately low since it is an acute phase reactant and there appears to be a chronic inflammatory process going on. CT shows an inflammatory process in area of terminal ileum. Colonoscopy would be next logical intervention.

## 2013-10-22 NOTE — Progress Notes (Signed)
[facility] Progress Note  Subjective: Minimal abdominal pain this morning. No new complaints Objective: Vital signs in last 24 hours: Temp:  [97.7 F (36.5 C)-100.3 F (37.9 C)] 98.5 F (36.9 C) (01/22 0730) Pulse Rate:  [63-103] 63 (01/22 0730) Resp:  [16-18] 16 (01/22 0730) BP: (98-126)/(62-76) 102/65 mmHg (01/22 0730) SpO2:  [96 %-100 %] 100 % (01/22 0730) Weight:  [48.081 kg (106 lb)-48.399 kg (106 lb 11.2 oz)] 48.081 kg (106 lb) (01/21 1445) Weight change:    PE: unchanged  Lab Results: Results for orders placed during the hospital encounter of 10/21/13 (from the past 24 hour(s))  COMPREHENSIVE METABOLIC PANEL     Status: Abnormal   Collection Time    10/21/13 10:30 AM      Result Value Range   Sodium 140  137 - 147 mEq/L   Potassium 3.7  3.7 - 5.3 mEq/L   Chloride 102  96 - 112 mEq/L   CO2 26  19 - 32 mEq/L   Glucose, Bld 79  70 - 99 mg/dL   BUN 7  6 - 23 mg/dL   Creatinine, Ser 0.50  0.50 - 1.10 mg/dL   Calcium 8.6  8.4 - 10.5 mg/dL   Total Protein 6.7  6.0 - 8.3 g/dL   Albumin 2.0 (*) 3.5 - 5.2 g/dL   AST 13  0 - 37 U/L   ALT 22  0 - 35 U/L   Alkaline Phosphatase 105  39 - 117 U/L   Total Bilirubin 0.3  0.3 - 1.2 mg/dL   GFR calc non Af Amer >90  >90 mL/min   GFR calc Af Amer >90  >90 mL/min  CBC WITH DIFFERENTIAL     Status: Abnormal   Collection Time    10/21/13 10:30 AM      Result Value Range   WBC 3.1 (*) 4.0 - 10.5 K/uL   RBC 3.07 (*) 3.87 - 5.11 MIL/uL   Hemoglobin 7.1 (*) 12.0 - 15.0 g/dL   HCT 24.1 (*) 36.0 - 46.0 %   MCV 78.5  78.0 - 100.0 fL   MCH 23.1 (*) 26.0 - 34.0 pg   MCHC 29.5 (*) 30.0 - 36.0 g/dL   RDW 19.2 (*) 11.5 - 15.5 %   Platelets 576 (*) 150 - 400 K/uL   Neutrophils Relative % 41 (*) 43 - 77 %   Neutro Abs 1.3 (*) 1.7 - 7.7 K/uL   Lymphocytes Relative 30  12 - 46 %   Lymphs Abs 0.9  0.7 - 4.0 K/uL   Monocytes Relative 26 (*) 3 - 12 %   Monocytes Absolute 0.8  0.1 - 1.0 K/uL   Eosinophils Relative 4  0 - 5 %   Eosinophils Absolute 0.1  0.0 - 0.7 K/uL   Basophils Relative 1  0 - 1 %   Basophils Absolute 0.0  0.0 - 0.1 K/uL  LIPASE, BLOOD     Status: None   Collection Time    10/21/13 10:30 AM      Result Value Range   Lipase 13  11 - 59 U/L  URINALYSIS, ROUTINE W REFLEX MICROSCOPIC     Status: None   Collection Time    10/21/13 11:10 AM      Result Value Range   Color, Urine YELLOW  YELLOW   APPearance CLEAR  CLEAR   Specific Gravity, Urine 1.010  1.005 - 1.030   pH 6.5  5.0 - 8.0   Glucose, UA NEGATIVE  NEGATIVE  mg/dL   Hgb urine dipstick NEGATIVE  NEGATIVE   Bilirubin Urine NEGATIVE  NEGATIVE   Ketones, ur NEGATIVE  NEGATIVE mg/dL   Protein, ur NEGATIVE  NEGATIVE mg/dL   Urobilinogen, UA 0.2  0.0 - 1.0 mg/dL   Nitrite NEGATIVE  NEGATIVE   Leukocytes, UA NEGATIVE  NEGATIVE  PREGNANCY, URINE     Status: None   Collection Time    10/21/13 11:10 AM      Result Value Range   Preg Test, Ur NEGATIVE  NEGATIVE  OCCULT BLOOD X 1 CARD TO LAB, STOOL     Status: None   Collection Time    10/21/13 11:10 AM      Result Value Range   Fecal Occult Bld NEGATIVE  NEGATIVE  PREPARE RBC (CROSSMATCH)     Status: None   Collection Time    10/21/13  4:30 PM      Result Value Range   Order Confirmation ORDER PROCESSED BY BLOOD BANK    LACTATE DEHYDROGENASE     Status: None   Collection Time    10/21/13  4:52 PM      Result Value Range   LDH 121  94 - 250 U/L  TSH     Status: None   Collection Time    10/21/13  4:52 PM      Result Value Range   TSH 1.476  0.350 - 4.500 uIU/mL  VITAMIN B12     Status: None   Collection Time    10/21/13  4:52 PM      Result Value Range   Vitamin B-12 524  211 - 911 pg/mL  FOLATE     Status: None   Collection Time    10/21/13  4:52 PM      Result Value Range   Folate >20.0    IRON AND TIBC     Status: Abnormal   Collection Time    10/21/13  4:52 PM      Result Value Range   Iron <10 (*) 42 - 135 ug/dL   TIBC Not calculated due to Iron <10.  250 - 470  ug/dL   Saturation Ratios Not calculated due to Iron <10.  20 - 55 %   UIBC 216  125 - 400 ug/dL  FERRITIN     Status: None   Collection Time    10/21/13  4:52 PM      Result Value Range   Ferritin 37  10 - 291 ng/mL  CBC WITH DIFFERENTIAL     Status: Abnormal   Collection Time    10/21/13  4:52 PM      Result Value Range   WBC 3.0 (*) 4.0 - 10.5 K/uL   RBC 2.97 (*) 3.87 - 5.11 MIL/uL   Hemoglobin 6.9 (*) 12.0 - 15.0 g/dL   HCT 23.1 (*) 36.0 - 46.0 %   MCV 77.8 (*) 78.0 - 100.0 fL   MCH 23.2 (*) 26.0 - 34.0 pg   MCHC 29.9 (*) 30.0 - 36.0 g/dL   RDW 19.3 (*) 11.5 - 15.5 %   Platelets 575 (*) 150 - 400 K/uL   Neutrophils Relative % 47  43 - 77 %   Neutro Abs 1.4 (*) 1.7 - 7.7 K/uL   Lymphocytes Relative 24  12 - 46 %   Lymphs Abs 0.7  0.7 - 4.0 K/uL   Monocytes Relative 26 (*) 3 - 12 %   Monocytes Absolute 0.8  0.1 - 1.0 K/uL  Eosinophils Relative 3  0 - 5 %   Eosinophils Absolute 0.1  0.0 - 0.7 K/uL   Basophils Relative 1  0 - 1 %   Basophils Absolute 0.0  0.0 - 0.1 K/uL  SEDIMENTATION RATE     Status: Abnormal   Collection Time    10/21/13  4:52 PM      Result Value Range   Sed Rate 68 (*) 0 - 22 mm/hr  RETICULOCYTES     Status: Abnormal   Collection Time    10/21/13  4:52 PM      Result Value Range   Retic Ct Pct 3.6 (*) 0.4 - 3.1 %   RBC. 2.97 (*) 3.87 - 5.11 MIL/uL   Retic Count, Manual 106.9  19.0 - 186.0 K/uL  EBV AB TO VIRAL CAPSID AG PNL, IGG+IGM     Status: Abnormal   Collection Time    10/21/13  4:52 PM      Result Value Range   EBV VCA IgG 89.0 (*) <18.0 U/mL   EBV VCA IgM <10.0  <36.0 U/mL  HIV ANTIBODY (ROUTINE TESTING)     Status: None   Collection Time    10/21/13  4:52 PM      Result Value Range   HIV NON REACTIVE  NON REACTIVE  TYPE AND SCREEN     Status: None   Collection Time    10/21/13  4:52 PM      Result Value Range   ABO/RH(D) B POS     Antibody Screen NEG     Sample Expiration 10/24/2013     Unit Number [id number]     Blood  Component Type RED CELLS,LR     Unit division 00     Status of Unit ISSUED,FINAL     Transfusion Status OK TO TRANSFUSE     Crossmatch Result Compatible     Unit Number [id number]     Blood Component Type RED CELLS,LR     Unit division 00     Status of Unit ISSUED     Transfusion Status OK TO TRANSFUSE     Crossmatch Result Compatible    ABO/RH     Status: None   Collection Time    10/21/13  4:52 PM      Result Value Range   ABO/RH(D) B POS    HAPTOGLOBIN     Status: Abnormal   Collection Time    10/21/13  4:52 PM      Result Value Range   Haptoglobin 322 (*) 45 - 215 mg/dL  HEMOGLOBIN AND HEMATOCRIT, BLOOD     Status: Abnormal   Collection Time    10/21/13 10:12 PM      Result Value Range   Hemoglobin 6.0 (*) 12.0 - 15.0 g/dL   HCT 19.9 (*) 36.0 - 46.0 %    Studies/Results: Ct Abdomen Pelvis W Contrast  10/21/2013   CLINICAL DATA:  Chronic lower abdominal pain  EXAM: CT ABDOMEN AND PELVIS WITH CONTRAST  TECHNIQUE: Multidetector CT imaging of the abdomen and pelvis was performed using the standard protocol following bolus administration of intravenous contrast.  CONTRAST:  38mL OMNIPAQUE IOHEXOL 300 MG/ML SOLN, 31mL OMNIPAQUE IOHEXOL 300 MG/ML SOLN  COMPARISON:  CT ABD/PELVIS W CM dated 05/06/2012  FINDINGS: Gallbladder is decompressed  Liver, spleen, pancreas, adrenal glands, and kidneys are within normal limits.  Extensive stool throughout the colon.  Stable mildly prominent right lower quadrant mesenteric lymph nodes.  Normal appendix.  The distal ileum  has an abnormal appearance. There is mild prominence of the wall with greater than what is expected to be normal enhancement. There is prominent fat surrounding the distal ileum. These findings suggest inflammatory bowel disease.  Trace free fluid in the pelvis.  Cystic changes in the ovaries.  Uterus and bladder are within normal limits.  IMPRESSION: Abnormal appearance of the distal ileum as described worrisome for Crohn's  disease.   Electronically Signed   By: Maryclare Bean M.D.   On: 10/21/2013 19:52      Assessment: Abdominal pain weight loss and hematologic abnormalities.  Inflammatory process consistent with Crohn's disease found in the terminal ileum on current CT scan  Plan: Will prep today for colonoscopy tomorrow with attempted intubation of the terminal ileum for more specific diagnosis.    Makenze Ellett C 10/22/2013, 8:29 AM

## 2013-10-23 ENCOUNTER — Encounter (HOSPITAL_COMMUNITY)
Admission: EM | Disposition: A | Discharge status: Home / Self Care | Payer: Self-pay | Source: Home / Self Care | Attending: Internal Medicine

## 2013-10-23 ENCOUNTER — Encounter (HOSPITAL_COMMUNITY): Payer: Self-pay

## 2013-10-23 DIAGNOSIS — K5000 Crohn's disease of small intestine without complications: Secondary | ICD-10-CM

## 2013-10-23 DIAGNOSIS — E43 Unspecified severe protein-calorie malnutrition: Secondary | ICD-10-CM

## 2013-10-23 HISTORY — PX: COLONOSCOPY: SHX5424

## 2013-10-23 LAB — TYPE AND SCREEN
ABO/RH(D): B POS
ANTIBODY SCREEN: NEGATIVE
UNIT DIVISION: 0
Unit division: 0

## 2013-10-23 LAB — CBC WITH DIFFERENTIAL/PLATELET
Basophils Absolute: 0 10*3/uL (ref 0.0–0.1)
Basophils Relative: 0 % (ref 0–1)
EOS PCT: 5 % (ref 0–5)
Eosinophils Absolute: 0.2 10*3/uL (ref 0.0–0.7)
HEMATOCRIT: 30 % — AB (ref 36.0–46.0)
Hemoglobin: 9.4 g/dL — ABNORMAL LOW (ref 12.0–15.0)
LYMPHS PCT: 29 % (ref 12–46)
Lymphs Abs: 0.9 10*3/uL (ref 0.7–4.0)
MCH: 24.9 pg — ABNORMAL LOW (ref 26.0–34.0)
MCHC: 31.3 g/dL (ref 30.0–36.0)
MCV: 79.6 fL (ref 78.0–100.0)
MONO ABS: 0.7 10*3/uL (ref 0.1–1.0)
MONOS PCT: 23 % — AB (ref 3–12)
Neutro Abs: 1.4 10*3/uL — ABNORMAL LOW (ref 1.7–7.7)
Neutrophils Relative %: 43 % (ref 43–77)
Platelets: 468 10*3/uL — ABNORMAL HIGH (ref 150–400)
RBC: 3.77 MIL/uL — ABNORMAL LOW (ref 3.87–5.11)
RDW: 17.6 % — AB (ref 11.5–15.5)
WBC: 3.2 10*3/uL — AB (ref 4.0–10.5)

## 2013-10-23 LAB — GLUCOSE, CAPILLARY
GLUCOSE-CAPILLARY: 43 mg/dL — AB (ref 70–99)
Glucose-Capillary: 149 mg/dL — ABNORMAL HIGH (ref 70–99)

## 2013-10-23 LAB — BASIC METABOLIC PANEL
BUN: 5 mg/dL — ABNORMAL LOW (ref 6–23)
CO2: 23 meq/L (ref 19–32)
CREATININE: 0.54 mg/dL (ref 0.50–1.10)
Calcium: 8.3 mg/dL — ABNORMAL LOW (ref 8.4–10.5)
Chloride: 102 mEq/L (ref 96–112)
GFR calc Af Amer: >90 mL/min (ref >90)
GFR calc non Af Amer: >90 mL/min (ref >90)
Glucose, Bld: 62 mg/dL — ABNORMAL LOW (ref 70–99)
Potassium: 3.4 mEq/L — ABNORMAL LOW (ref 3.7–5.3)
Sodium: 140 mEq/L (ref 137–147)

## 2013-10-23 LAB — EPSTEIN BARR VRS(EBV DNA BY PCR): EBV DNA QN by PCR: <500 copies/mL (ref <500)

## 2013-10-23 LAB — QUANTIFERON TB GOLD ASSAY (BLOOD)
Mitogen value: 0.43 IU/mL
QUANTIFERON NIL VALUE: 0.19 [IU]/mL
TB Ag value: 0.18 IU/mL
TB Antigen Minus Nil Value: 0 IU/mL

## 2013-10-23 LAB — CMV IGM

## 2013-10-23 LAB — CMV ANTIBODY, IGG (EIA)

## 2013-10-23 SURGERY — COLONOSCOPY
Anesthesia: Moderate Sedation

## 2013-10-23 MED ORDER — FENTANYL CITRATE 0.05 MG/ML IJ SOLN
INTRAMUSCULAR | Status: DC | PRN
  Administered 2013-10-23 (×5): 25 ug via INTRAVENOUS

## 2013-10-23 MED ORDER — ONDANSETRON HCL 4 MG/2ML IJ SOLN: 4.0000 mg | INTRAMUSCULAR | Status: DC | PRN

## 2013-10-23 MED ORDER — MIDAZOLAM HCL 10 MG/2ML IJ SOLN
INTRAMUSCULAR | Status: AC
  Filled 2013-10-23: qty 2

## 2013-10-23 MED ORDER — MESALAMINE ER 250 MG PO CPCR
500.0000 mg | ORAL_CAPSULE | Freq: Four times a day (QID) | ORAL | Status: DC
  Administered 2013-10-23 – 2013-10-24 (×6): 500 mg via ORAL
  Filled 2013-10-23 (×8): qty 2

## 2013-10-23 MED ORDER — PROMETHAZINE HCL 25 MG/ML IJ SOLN
12.5000 mg | Freq: Once | INTRAMUSCULAR | Status: AC
  Administered 2013-10-23: 12.5 mg via INTRAVENOUS
  Filled 2013-10-23: qty 1

## 2013-10-23 MED ORDER — ONDANSETRON HCL 4 MG PO TABS: 4.0000 mg | ORAL_TABLET | ORAL | Status: DC | PRN

## 2013-10-23 MED ORDER — DEXTROSE 50 % IV SOLN
1.0000 | Freq: Once | INTRAVENOUS | Status: AC
  Administered 2013-10-23: 50 mL via INTRAVENOUS

## 2013-10-23 MED ORDER — FENTANYL CITRATE 0.05 MG/ML IJ SOLN
INTRAMUSCULAR | Status: AC
  Filled 2013-10-23: qty 4

## 2013-10-23 MED ORDER — DIPHENHYDRAMINE HCL 50 MG/ML IJ SOLN
INTRAMUSCULAR | Status: AC
  Filled 2013-10-23: qty 1

## 2013-10-23 MED ORDER — MIDAZOLAM HCL 5 MG/5ML IJ SOLN
INTRAMUSCULAR | Status: DC | PRN
  Administered 2013-10-23 (×2): 2 mg via INTRAVENOUS
  Administered 2013-10-23 (×2): 1 mg via INTRAVENOUS
  Administered 2013-10-23: 2 mg via INTRAVENOUS

## 2013-10-23 MED ORDER — PREDNISONE 50 MG PO TABS
50.0000 mg | ORAL_TABLET | Freq: Every day | ORAL | Status: DC
  Administered 2013-10-23 – 2013-10-24 (×2): 50 mg via ORAL
  Filled 2013-10-23 (×3): qty 1

## 2013-10-23 NOTE — Progress Notes (Signed)
[facility] Progress Note  Subjective: The patient tolerated bowel prep, no new complaints  Objective: Vital signs in last 24 hours: Temp:  [97.6 F (36.4 C)-98.6 F (37 C)] 97.6 F (36.4 C) (01/23 0728) Pulse Rate:  [54-109] 96 (01/23 0830) Resp:  [15-20] 17 (01/23 0830) BP: (110-154)/(54-97) 110/64 mmHg (01/23 0830) SpO2:  [95 %-100 %] 97 % (01/23 0830) Weight change:    PE: Unchanged  Lab Results: Results for orders placed during the hospital encounter of 10/21/13 (from the past 24 hour(s))  SEDIMENTATION RATE     Status: Abnormal   Collection Time    10/22/13 10:00 AM      Result Value Range   Sed Rate 49 (*) 0 - 22 mm/hr  CBC WITH DIFFERENTIAL     Status: Abnormal   Collection Time    10/22/13 10:00 AM      Result Value Range   WBC 4.2  4.0 - 10.5 K/uL   RBC 4.03  3.87 - 5.11 MIL/uL   Hemoglobin 10.2 (*) 12.0 - 15.0 g/dL   HCT 31.6 (*) 36.0 - 46.0 %   MCV 78.4  78.0 - 100.0 fL   MCH 25.3 (*) 26.0 - 34.0 pg   MCHC 32.3  30.0 - 36.0 g/dL   RDW 17.1 (*) 11.5 - 15.5 %   Platelets 513 (*) 150 - 400 K/uL   Neutrophils Relative % 51  43 - 77 %   Neutro Abs 2.2  1.7 - 7.7 K/uL   Lymphocytes Relative 22  12 - 46 %   Lymphs Abs 0.9  0.7 - 4.0 K/uL   Monocytes Relative 23 (*) 3 - 12 %   Monocytes Absolute 1.0  0.1 - 1.0 K/uL   Eosinophils Relative 3  0 - 5 %   Eosinophils Absolute 0.1  0.0 - 0.7 K/uL   Basophils Relative 1  0 - 1 %   Basophils Absolute 0.0  0.0 - 0.1 K/uL  HEMOGLOBIN AND HEMATOCRIT, BLOOD     Status: Abnormal   Collection Time    10/22/13  4:42 PM      Result Value Range   Hemoglobin 9.7 (*) 12.0 - 15.0 g/dL   HCT 31.3 (*) 36.0 - 46.0 %  OCCULT BLOOD X 1 CARD TO LAB, STOOL     Status: Abnormal   Collection Time    10/22/13  7:44 PM      Result Value Range   Fecal Occult Bld POSITIVE (*) NEGATIVE  BASIC METABOLIC PANEL     Status: Abnormal   Collection Time    10/23/13  5:25 AM      Result Value Range   Sodium 140  137 - 147 mEq/L    Potassium 3.4 (*) 3.7 - 5.3 mEq/L   Chloride 102  96 - 112 mEq/L   CO2 23  19 - 32 mEq/L   Glucose, Bld 62 (*) 70 - 99 mg/dL   BUN 5 (*) 6 - 23 mg/dL   Creatinine, Ser 0.54  0.50 - 1.10 mg/dL   Calcium 8.3 (*) 8.4 - 10.5 mg/dL   GFR calc non Af Amer >90  >90 mL/min   GFR calc Af Amer >90  >90 mL/min  CBC WITH DIFFERENTIAL     Status: Abnormal   Collection Time    10/23/13  5:25 AM      Result Value Range   WBC 3.2 (*) 4.0 - 10.5 K/uL   RBC 3.77 (*) 3.87 - 5.11 MIL/uL  Hemoglobin 9.4 (*) 12.0 - 15.0 g/dL   HCT 30.0 (*) 36.0 - 46.0 %   MCV 79.6  78.0 - 100.0 fL   MCH 24.9 (*) 26.0 - 34.0 pg   MCHC 31.3  30.0 - 36.0 g/dL   RDW 17.6 (*) 11.5 - 15.5 %   Platelets 468 (*) 150 - 400 K/uL   Neutrophils Relative % 43  43 - 77 %   Neutro Abs 1.4 (*) 1.7 - 7.7 K/uL   Lymphocytes Relative 29  12 - 46 %   Lymphs Abs 0.9  0.7 - 4.0 K/uL   Monocytes Relative 23 (*) 3 - 12 %   Monocytes Absolute 0.7  0.1 - 1.0 K/uL   Eosinophils Relative 5  0 - 5 %   Eosinophils Absolute 0.2  0.0 - 0.7 K/uL   Basophils Relative 0  0 - 1 %   Basophils Absolute 0.0  0.0 - 0.1 K/uL    Studies/Results: Dg Chest 2 View  10/22/2013   CLINICAL DATA:  Dizziness and fatigue.  Leukopenia.  EXAM: CHEST  2 VIEW  COMPARISON:  None.  FINDINGS: The heart size and mediastinal contours are within normal limits. Both lungs are clear. There is a slight thoracolumbar scoliosis.  IMPRESSION: No significant abnormalities.   Electronically Signed   By: Rozetta Nunnery M.D.   On: 10/22/2013 15:44   Ct Abdomen Pelvis W Contrast  10/21/2013   CLINICAL DATA:  Chronic lower abdominal pain  EXAM: CT ABDOMEN AND PELVIS WITH CONTRAST  TECHNIQUE: Multidetector CT imaging of the abdomen and pelvis was performed using the standard protocol following bolus administration of intravenous contrast.  CONTRAST:  34mL OMNIPAQUE IOHEXOL 300 MG/ML SOLN, 72mL OMNIPAQUE IOHEXOL 300 MG/ML SOLN  COMPARISON:  CT ABD/PELVIS W CM dated 05/06/2012  FINDINGS:  Gallbladder is decompressed  Liver, spleen, pancreas, adrenal glands, and kidneys are within normal limits.  Extensive stool throughout the colon.  Stable mildly prominent right lower quadrant mesenteric lymph nodes.  Normal appendix.  The distal ileum has an abnormal appearance. There is mild prominence of the wall with greater than what is expected to be normal enhancement. There is prominent fat surrounding the distal ileum. These findings suggest inflammatory bowel disease.  Trace free fluid in the pelvis.  Cystic changes in the ovaries.  Uterus and bladder are within normal limits.  IMPRESSION: Abnormal appearance of the distal ileum as described worrisome for Crohn's disease.   Electronically Signed   By: Maryclare Bean M.D.   On: 10/21/2013 19:52      Assessment: Likely Crohn's disease by colonoscopic findings, biopsies pending  Plan: Will begin corticosteroids and 5-ASA as well as low residue diet, await biopsies to rule out neoplasm which is felt less likely. Further hematologic workup as determined by hematology.    Rashana Andrew C 10/23/2013, 8:38 AM

## 2013-10-23 NOTE — Progress Notes (Signed)
Upon giving procedural report to the RN on the floor, I was informed that patient's glucose level per morning labs was in the 60's. There was no report given to the Endoscopy pre-procedural nurse or charge nurse prior to patient leaving her home unit so colonoscopy was performed with moderate sedation. FSBS was checked in recovery and was 43. MD was notified and 1 amp of D50 was administered along with graham crackers/ginger ale. 1min re-check was 146. Floor nurses was given report and updated. Richardean Sale, RN

## 2013-10-23 NOTE — Op Note (Signed)
[facility] [place], [address]   OPERATIVE PROCEDURE REPORT  PATIENT: Beverly Richards, Beverly Richards  MR#: [id number] BIRTHDATE: 01/18/1991  GENDER: Female ENDOSCOPIST: Teena Irani, MD ASSISTANT:   Cleda Daub, RN CGRN Corliss Parish, technician PROCEDURE DATE: 10/23/2013 PROCEDURE: ASA CLASS: INDICATIONS:suspicion of Crohn's disease by CT scan MEDICATIONS: fentanyl 125 mcg, Versed 8 mg  DESCRIPTION OF PROCEDURE:   After the risks benefits and alternatives of the procedure were thoroughly explained, informed consent was obtained.        The Pentax Ped Colon [id number] endoscope was introduced through the anus  and advanced to the terminal ileum      , No adverse events experienced.    The quality of the prep was good      .  The instrument was then slowly withdrawn as the colon was fully examined.   the terminal ileum was intubated and showed nodularity in a cobblestone appearance with scattered exudate consistent with Crohn's disease. This appearance persisted for about 10 cm and did not fade to normal mucosa during the area is examined. Multiple biopsies were taken. The ileocecal valve itself appeared normal as did the cecum A. ascending transverse descending sigmoid and rectum. No further inflammatory abnormalities were noted. retroflex view the anus was unremarkable.          The scope was then withdrawn from the patient and the procedure terminated.  COMPLICATIONS: There were no complications.  IMPRESSION:inflammation of the terminal ileum consistent with Crohn's disease rule out neoplasm RECOMMENDATIONS:await biopsy results. We'll go ahead and start corticosteroids and a 5-ASA product.  _______________________________ Lorrin MaisTeena Irani, MD 10/23/2013 8:35 AM

## 2013-10-23 NOTE — Progress Notes (Signed)
[facility] PROGRESS NOTE  Beverly Richards SWF:[id number] DOB: [birth year] DOA: 10/21/2013 PCP: No PCP Per Patient  Assessment/Plan: Abdominal pain with abnormal CT abdomen and pelvis  -10/21/2013 CT abdomen showed extensive stool with mild prominence of distal ileum--concerning for inflammatory bowel disease  -Appreciate Dr. Amedeo Plenty  -colonoscopy on 10/22/13-->inflammation abdominal ileum consistent with Crohn's disease -Hepatic panel and lipase negative  IBD/Crohn's disease -Await final pathology from colon biopsies -Corticosteroids and 5-ASA started per GI -low fiber diet Mesenteric lymphadenopathy  -May be reactive due to IBD  -will need surveillance imaging  -No bulky adenopathy or hepatosplenomegaly noted Iron deficiency anemia  -Likely contributing to the patient's thrombocytosis  -Ultimately need to start ferrous sulfate  -Received 2 units PRBCs--10/21/2013  - LDH and haptoglobin do not suggest hemolytic process  Leukopenia  -HIV test negative  -obtain CMV DNA by PCR--pending  -EBV DNA by PCR--neg  -appreciate Dr. Beryle Beams followup  -obtain CXR--neg -QuantiFERON--indeterminant but no epidemiologic evidence to suggest active TB  Family Communication: mother at beside  Disposition Plan: Home when cleared by GI         Procedures/Studies: Dg Chest 2 View  10/22/2013   CLINICAL DATA:  Dizziness and fatigue.  Leukopenia.  EXAM: CHEST  2 VIEW  COMPARISON:  None.  FINDINGS: The heart size and mediastinal contours are within normal limits. Both lungs are clear. There is a slight thoracolumbar scoliosis.  IMPRESSION: No significant abnormalities.   Electronically Signed   By: Rozetta Nunnery M.D.   On: 10/22/2013 15:44   Ct Abdomen Pelvis W Contrast  10/21/2013   CLINICAL DATA:  Chronic lower abdominal pain  EXAM: CT ABDOMEN AND PELVIS WITH CONTRAST  TECHNIQUE: Multidetector CT imaging of the abdomen and pelvis was performed using the standard protocol following bolus  administration of intravenous contrast.  CONTRAST:  92mL OMNIPAQUE IOHEXOL 300 MG/ML SOLN, 54mL OMNIPAQUE IOHEXOL 300 MG/ML SOLN  COMPARISON:  CT ABD/PELVIS W CM dated 05/06/2012  FINDINGS: Gallbladder is decompressed  Liver, spleen, pancreas, adrenal glands, and kidneys are within normal limits.  Extensive stool throughout the colon.  Stable mildly prominent right lower quadrant mesenteric lymph nodes.  Normal appendix.  The distal ileum has an abnormal appearance. There is mild prominence of the wall with greater than what is expected to be normal enhancement. There is prominent fat surrounding the distal ileum. These findings suggest inflammatory bowel disease.  Trace free fluid in the pelvis.  Cystic changes in the ovaries.  Uterus and bladder are within normal limits.  IMPRESSION: Abnormal appearance of the distal ileum as described worrisome for Crohn's disease.   Electronically Signed   By: Maryclare Bean M.D.   On: 10/21/2013 19:52         Subjective: Patient continues to have loose stools without any blood. Denies any fevers, chills, chest pain or shortness breath,  vomiting. She continues to have abdominal cramping. No dizziness or headache.  Objective: Filed Vitals:   10/23/13 0910 10/23/13 0920 10/23/13 0930 10/23/13 1458  BP: 110/76 117/59 116/61 110/72  Pulse:    110  Temp:    99.7 F (37.6 C)  TempSrc:    Oral  Resp: 17 23 14 16   Height:      Weight:      SpO2: 100% 100% 100% 96%    Intake/Output Summary (Last 24 hours) at 10/23/13 1631 Last data filed at 10/23/13 0400  Gross per 24 hour  Intake    380 ml  Output  0 ml  Net    380 ml   Weight change:  Exam:   General:  Pt is alert, follows commands appropriately, not in acute distress  HEENT: No icterus, No thrush,  /AT  Cardiovascular: RRR, S1/S2, no rubs, no gallops  Respiratory: CTA bilaterally, no wheezing, no crackles, no rhonchi  Abdomen: Soft/+BS, diffusely tender without any rebound tenderness non  distended, no guarding  Extremities: No edema, No lymphangitis, No petechiae, No rashes, no synovitis  Data Reviewed: Basic Metabolic Panel:  Recent Labs Lab 10/21/13 1030 10/23/13 0525  NA 140 140  K 3.7 3.4*  CL 102 102  CO2 26 23  GLUCOSE 79 62*  BUN 7 5*  CREATININE 0.50 0.54  CALCIUM 8.6 8.3*   Liver Function Tests:  Recent Labs Lab 10/21/13 1030  AST 13  ALT 22  ALKPHOS 105  BILITOT 0.3  PROT 6.7  ALBUMIN 2.0*    Recent Labs Lab 10/21/13 1030  LIPASE 13   No results found for this basename: AMMONIA,  in the last 168 hours CBC:  Recent Labs Lab 10/21/13 1030 10/21/13 1652 10/21/13 2212 10/22/13 1000 10/22/13 1642 10/23/13 0525  WBC 3.1* 3.0*  --  4.2  --  3.2*  NEUTROABS 1.3* 1.4*  --  2.2  --  1.4*  HGB 7.1* 6.9* 6.0* 10.2* 9.7* 9.4*  HCT 24.1* 23.1* 19.9* 31.6* 31.3* 30.0*  MCV 78.5 77.8*  --  78.4  --  79.6  PLT 576* 575*  --  513*  --  468*   Cardiac Enzymes: No results found for this basename: CKTOTAL, CKMB, CKMBINDEX, TROPONINI,  in the last 168 hours BNP: No components found with this basename: POCBNP,  CBG:  Recent Labs Lab 10/23/13 0856 10/23/13 0934  GLUCAP 43* 149*    No results found for this or any previous visit (from the past 240 hour(s)).   Scheduled Meds: . feeding supplement (RESOURCE BREEZE)  1 Container Oral TID BM  . mesalamine  500 mg Oral QID  . multivitamin with minerals  1 tablet Oral q morning - 10a  . pantoprazole  40 mg Oral BID AC  . predniSONE  50 mg Oral Q breakfast   Continuous Infusions: . sodium chloride 100 mL/hr at 10/21/13 1700  . sodium chloride 20 mL/hr at 10/23/13 1107     Beverly Platner, DO  [facility] Pager [phone]  If 7PM-7AM, please contact night-coverage [email or web address] Password [email or web address] 10/23/2013, 4:31 PM   LOS: 2 days

## 2013-10-24 DIAGNOSIS — K5000 Crohn's disease of small intestine without complications: Principal | ICD-10-CM

## 2013-10-24 LAB — CBC
HEMATOCRIT: 31.4 % — AB (ref 36.0–46.0)
HEMOGLOBIN: 9.8 g/dL — AB (ref 12.0–15.0)
MCH: 24.6 pg — ABNORMAL LOW (ref 26.0–34.0)
MCHC: 31.2 g/dL (ref 30.0–36.0)
MCV: 78.9 fL (ref 78.0–100.0)
Platelets: 523 10*3/uL — ABNORMAL HIGH (ref 150–400)
RBC: 3.98 MIL/uL (ref 3.87–5.11)
RDW: 17.4 % — ABNORMAL HIGH (ref 11.5–15.5)
WBC: 7.1 10*3/uL (ref 4.0–10.5)

## 2013-10-24 LAB — BASIC METABOLIC PANEL
CHLORIDE: 105 meq/L (ref 96–112)
CO2: 24 meq/L (ref 19–32)
Calcium: 7.8 mg/dL — ABNORMAL LOW (ref 8.4–10.5)
Creatinine, Ser: 0.46 mg/dL — ABNORMAL LOW (ref 0.50–1.10)
GFR calc Af Amer: >90 mL/min (ref >90)
GFR calc non Af Amer: >90 mL/min (ref >90)
Glucose, Bld: 127 mg/dL — ABNORMAL HIGH (ref 70–99)
POTASSIUM: 3.4 meq/L — AB (ref 3.7–5.3)
Sodium: 139 mEq/L (ref 137–147)

## 2013-10-24 MED ORDER — UNABLE TO FIND
Status: DC
Start: 2013-10-24 — End: 2014-08-27

## 2013-10-24 MED ORDER — HYDROCODONE-ACETAMINOPHEN 5-325 MG PO TABS: 1.0000 | ORAL_TABLET | ORAL | Status: DC | PRN

## 2013-10-24 MED ORDER — FERROUS SULFATE 325 (65 FE) MG PO TABS: 325.0000 mg | ORAL_TABLET | Freq: Two times a day (BID) | ORAL | Status: DC

## 2013-10-24 MED ORDER — PREDNISONE 20 MG PO TABS: 40.0000 mg | ORAL_TABLET | Freq: Every day | ORAL | Status: DC

## 2013-10-24 MED ORDER — MESALAMINE ER 250 MG PO CPCR: 1000.0000 mg | ORAL_CAPSULE | Freq: Two times a day (BID) | ORAL | Status: DC

## 2013-10-24 MED ORDER — UNABLE TO FIND: Status: DC

## 2013-10-24 MED ORDER — HYDROCODONE-ACETAMINOPHEN 5-325 MG PO TABS
1.0000 | ORAL_TABLET | ORAL | Status: DC | PRN
  Administered 2013-10-24: 2 via ORAL
  Filled 2013-10-24: qty 2

## 2013-10-24 MED ORDER — ONDANSETRON 4 MG PO TBDP: 4.0000 mg | ORAL_TABLET | Freq: Three times a day (TID) | ORAL | Status: DC | PRN

## 2013-10-24 NOTE — Progress Notes (Addendum)
Beverly Richards 10:20 AM  Subjective: Patient had some difficulty in last night with increased pain but is feeling better today and seemingly tolerated breakfast and her case was discussed with her mother and my partner Dr. Amedeo Plenty and her hospital computer was reviewed  Objective: Vital signs stable afebrile no acute distress abdomen is still mildly tender throughout without guarding or rebound labs stable  Assessment: Crohn's disease which we discussed including diet and medicine  Plan: Okay to go home when tolerating by mouth and will followup with my partner in one to 2 weeks and call for biopsies middle of next week and would discharge her on some pain medicine nausea medicine prednisone and Pentasa and she will call us sooner when necessary and the warnings were discussed and will check on tomorrow if still here  and would probably not discharged on Carafate and would increase dose of Pentasa at home to 4 pills twice a day and probably discharge on 40 mg of prednisone and wait on Dr. Amedeo Plenty to begin taper on followup [facility]

## 2013-10-24 NOTE — Discharge Summary (Addendum)
Physician Discharge Summary  AVIGAIL GRANADOS [id number] DOB: 1991/05/12 DOA: 10/21/2013  PCP: No PCP Per Patient  Admit date: 10/21/2013 Discharge date: 10/24/2013  Recommendations for Outpatient Follow-up:  1. Pt will need to follow up with PCP in 2 weeks post discharge 2. Please obtain BMP to evaluate electrolytes and kidney function 3. Please also check CBC to evaluate Hg and Hct levels 4. Please followup on CMV DNA   Discharge Diagnoses:  Active Problems:   Anemia   Abdominal pain   Leukopenia   Monocytosis   Protein-calorie malnutrition, severe   Ileitis, terminal Abdominal pain with abnormal CT abdomen and pelvis  -10/21/2013 CT abdomen showed extensive stool with mild prominence of distal ileum--concerning for inflammatory bowel disease  -Appreciate Dr. Amedeo Plenty  -colonoscopy on 10/22/13-->inflammation abdominal ileum consistent with Crohn's disease  -Hepatic panel and lipase negative  IBD/Crohn's disease  -Await final pathology from colon biopsies  -Corticosteroids and 5-ASA started per GI  -low fiber diet  -Dr. Watt Climes cleared pt for d/c with mesalamine 2grams bid and prednisone 40mg  daily Mesenteric lymphadenopathy  -May be reactive due to IBD  -will need surveillance imaging  -No bulky adenopathy or hepatosplenomegaly noted  Iron deficiency anemia  -Likely contributing to the patient's thrombocytosis  - start ferrous sulfate bid -Received 2 units PRBCs--10/21/2013  - LDH and haptoglobin do not suggest hemolytic process  Leukopenia  -HIV test negative  -obtain CMV DNA by PCR--pending  -EBV DNA by PCR--neg  -appreciate Dr. Beryle Beams followup  -obtain CXR--neg  -QuantiFERON--indeterminant but no epidemiologic evidence to suggest active TB     Discharge Condition: stable  Disposition:      Follow-up Information   Follow up with HAYES,JOHN C, MD In 2 weeks.   Specialty:  Gastroenterology   Contact information:   [id number] [place] [address] [phone]       Follow up with Annia Belt, MD In 2 weeks.   Specialty:  Oncology   Contact information:   [place]. 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 Lab data recorded on 05/06/2012 showed a hemoglobin of 7.4, hematocrit 23, MCV 73, white count 8000 with 24% neutrophils, 35 bands, 14 lymphocytes, 20 monocytes, 3 myelocytes, 3 metamyelocytes, 1 promyelocyte, and platelet count 561,000. Normal liver function. CT scan of the abdomen and pelvis done 04/23/2012 with a repeat study done 05/06/2012. I have reviewed these images online. Findings included a small left ovarian cyst. "Mildly prominent right lower quadrant mesenteric lymph nodes". No bulky lymphadenopathy no splenomegaly. Focal fatty infiltration of the liver. Pancreas and gallbladder are normal.  She was put on iron supplements one daily. She has not had any consistent medical followup until now when she presented to the [facility] emergency department with recurrent pain. Pain has never resolved. It is becoming more frequent and constant. It is associated with nausea and occasional vomiting. It is variably crampy, sharp, and burning. Sometimes worse after meals. No dysphagia. Positive early satiety. Zantac relieves the pain occasionally. Intermittent loose bowel movements. No hematochezia. Stool is dark from iron.  Over the last month she has lost 15 pounds. She has developed profuse drenching night sweats and has had to  change her bedclothes about 4 times each night. She has not checked her temperature.  She has no signs or symptoms of a collagen vascular disorder. Her knees sometimes ache but she is on her feet all day.   No neurologic symptoms and she denies headache, change in vision, or paresthesias. Sometimes her feet do feel numb.  She has not noted any swollen glands, no bruising or bleeding, no epistaxis or hematuria.  She's never had a blood transfusion.  She has never been pregnant.  She is sexually active but not for a number of months.  She does not smoke, use alcohol, or use recreational drugs.       Consultants: [facility]--[facility]  Discharge Exam: Filed Vitals:   10/24/13 1336  BP: 121/78  Pulse: 62  Temp: 98.3 F (36.8 C)  Resp: 16   Filed Vitals:   10/23/13 1458 10/23/13 2143 10/24/13 0622 10/24/13 1336  BP: 110/72 108/65 104/65 121/78  Pulse: 110 62 48 62  Temp: 99.7 F (37.6 C) 98.4 F (36.9 C) 97.7 F (36.5 C) 98.3 F (36.8 C)  TempSrc: Oral Oral Oral Oral  Resp: 16 16 16 16   Height:      Weight:      SpO2: 96% 96% 98% 96%   General: A&O x 3, NAD, pleasant, cooperative Cardiovascular: RRR, no rub, no gallop, no S3 Respiratory: CTAB, no wheeze, no rhonchi Abdomen:soft, mild periumbilical tenderness without any rebound, nondistended, positive bowel sounds Extremities: No edema, No lymphangitis, no petechiae  Discharge Instructions     Medication List    STOP taking these medications       naproxen sodium 220 MG tablet  Commonly known as:  ANAPROX     sucralfate 1 GM/10ML suspension  Commonly known as:  CARAFATE      TAKE these medications       ferrous sulfate 325 (65 FE) MG tablet  Take 1 tablet (325 mg total) by mouth 2 (two) times daily with a meal.     GAS-X PO  Take 1 tablet by mouth 4 (four) times daily as needed (For gas pain.).     HYDROcodone-acetaminophen 5-325 MG per tablet  Commonly known as:  NORCO/VICODIN  Take 1-2 tablets by mouth every 4 (four) hours as needed for moderate pain.     mesalamine 250 MG CR capsule  Commonly known as:  PENTASA  Take 4 capsules (1,000 mg total) by mouth 2 (two) times daily.      multivitamin with minerals Tabs tablet  Take 1 tablet by mouth every morning.     ondansetron 4 MG disintegrating tablet  Commonly known as:  ZOFRAN ODT  Take 1 tablet (4 mg total) by mouth every 8 (eight) hours as needed for nausea or vomiting.     OVER THE COUNTER MEDICATION  Take 1 tablet by mouth daily as needed (For cold symptoms.). Airborne Gummies for Adults     predniSONE 20 MG tablet  Commonly known as:  DELTASONE  Take 2 tablets (40 mg total) by mouth daily with breakfast.     ranitidine 75 MG tablet  Commonly known as:  ZANTAC  Take 75 mg by mouth 2 (two) times daily as needed for heartburn.     UNABLE TO FIND  Ms. Laro was admitted to [facility] from 10/21/13 through 10/24/13.  She is medically stable to return to work and school.     UNABLE TO FIND  Ms. Windish was admitted to [facility] from  10/21/13 through 10/24/13.  She is medically stable to return to work and school.         The results of significant diagnostics from this hospitalization (including imaging, microbiology, ancillary and laboratory) are listed below for reference.    Significant Diagnostic Studies: Dg Chest 2 View  10/22/2013   CLINICAL DATA:  Dizziness and fatigue.  Leukopenia.  EXAM: CHEST  2 VIEW  COMPARISON:  None.  FINDINGS: The heart size and mediastinal contours are within normal limits. Both lungs are clear. There is a slight thoracolumbar scoliosis.  IMPRESSION: No significant abnormalities.   Electronically Signed   By: Rozetta Nunnery M.D.   On: 10/22/2013 15:44   Ct Abdomen Pelvis W Contrast  10/21/2013   CLINICAL DATA:  Chronic lower abdominal pain  EXAM: CT ABDOMEN AND PELVIS WITH CONTRAST  TECHNIQUE: Multidetector CT imaging of the abdomen and pelvis was performed using the standard protocol following bolus administration of intravenous contrast.  CONTRAST:  65mL OMNIPAQUE IOHEXOL 300 MG/ML SOLN, 70mL OMNIPAQUE IOHEXOL 300 MG/ML SOLN  COMPARISON:  CT  ABD/PELVIS W CM dated 05/06/2012  FINDINGS: Gallbladder is decompressed  Liver, spleen, pancreas, adrenal glands, and kidneys are within normal limits.  Extensive stool throughout the colon.  Stable mildly prominent right lower quadrant mesenteric lymph nodes.  Normal appendix.  The distal ileum has an abnormal appearance. There is mild prominence of the wall with greater than what is expected to be normal enhancement. There is prominent fat surrounding the distal ileum. These findings suggest inflammatory bowel disease.  Trace free fluid in the pelvis.  Cystic changes in the ovaries.  Uterus and bladder are within normal limits.  IMPRESSION: Abnormal appearance of the distal ileum as described worrisome for Crohn's disease.   Electronically Signed   By: Maryclare Bean M.D.   On: 10/21/2013 19:52     Microbiology: No results found for this or any previous visit (from the past 240 hour(s)).   Labs: Basic Metabolic Panel:  Recent Labs Lab 10/21/13 1030 10/23/13 0525 10/24/13 0513  NA 140 140 139  K 3.7 3.4* 3.4*  CL 102 102 105  CO2 26 23 24   GLUCOSE 79 62* 127*  BUN 7 5* <3*  CREATININE 0.50 0.54 0.46*  CALCIUM 8.6 8.3* 7.8*   Liver Function Tests:  Recent Labs Lab 10/21/13 1030  AST 13  ALT 22  ALKPHOS 105  BILITOT 0.3  PROT 6.7  ALBUMIN 2.0*    Recent Labs Lab 10/21/13 1030  LIPASE 13   No results found for this basename: AMMONIA,  in the last 168 hours CBC:  Recent Labs Lab 10/21/13 1030 10/21/13 1652 10/21/13 2212 10/22/13 1000 10/22/13 1642 10/23/13 0525 10/24/13 0513  WBC 3.1* 3.0*  --  4.2  --  3.2* 7.1  NEUTROABS 1.3* 1.4*  --  2.2  --  1.4*  --   HGB 7.1* 6.9* 6.0* 10.2* 9.7* 9.4* 9.8*  HCT 24.1* 23.1* 19.9* 31.6* 31.3* 30.0* 31.4*  MCV 78.5 77.8*  --  78.4  --  79.6 78.9  PLT 576* 575*  --  513*  --  468* 523*   Cardiac Enzymes: No results found for this basename: CKTOTAL, CKMB, CKMBINDEX, TROPONINI,  in the last 168 hours BNP: No components found  with this basename: POCBNP,  CBG:  Recent Labs Lab 10/23/13 0856 10/23/13 0934  GLUCAP 43* 149*    Time coordinating discharge:  Greater than 30 minutes  Signed:  Azaryah Heathcock, DO [facility] Pager: [phone]  10/24/2013, 3:15 PM

## 2013-10-24 NOTE — Care Management Note (Signed)
Cm consulted for possible medication assistance. Pt informed has [employer] stage [employer] which makes pt ineligible for [facility] program. Cm discussed dc meds with pt. Pt prescribed ferrous sulfate which is over the counter, zantac in which CM recommended patient purchase generic, and a narcotic caovered under pt's insurance plan for 4.00. Pt states able to afford co-pay. Previous medication not covered by pt's insurance planned discontinued prior to dc.    Venita Lick Miel Wisener,MSN,RN [phone]

## 2013-10-26 ENCOUNTER — Encounter (HOSPITAL_COMMUNITY): Payer: Self-pay | Admitting: Gastroenterology

## 2013-10-28 LAB — CMV (CYTOMEGALOVIRUS) DNA ULTRAQUANT, PCR: CMV DNA Quant: <200 copies/mL (ref <200)

## 2014-01-02 ENCOUNTER — Emergency Department (HOSPITAL_BASED_OUTPATIENT_CLINIC_OR_DEPARTMENT_OTHER)
Admission: EM | Admit: 2014-01-02 | Discharge: 2014-01-02 | Disposition: A | Discharge status: Home / Self Care | Payer: Medicaid Other | Attending: Emergency Medicine | Admitting: Emergency Medicine

## 2014-01-02 ENCOUNTER — Encounter (HOSPITAL_BASED_OUTPATIENT_CLINIC_OR_DEPARTMENT_OTHER): Payer: Self-pay | Admitting: Emergency Medicine

## 2014-01-02 DIAGNOSIS — Z8742 Personal history of other diseases of the female genital tract: Secondary | ICD-10-CM | POA: Insufficient documentation

## 2014-01-02 DIAGNOSIS — R197 Diarrhea, unspecified: Secondary | ICD-10-CM | POA: Insufficient documentation

## 2014-01-02 DIAGNOSIS — R1084 Generalized abdominal pain: Secondary | ICD-10-CM | POA: Insufficient documentation

## 2014-01-02 DIAGNOSIS — R5381 Other malaise: Secondary | ICD-10-CM | POA: Insufficient documentation

## 2014-01-02 DIAGNOSIS — R5383 Other fatigue: Secondary | ICD-10-CM

## 2014-01-02 DIAGNOSIS — Z8619 Personal history of other infectious and parasitic diseases: Secondary | ICD-10-CM | POA: Insufficient documentation

## 2014-01-02 DIAGNOSIS — R110 Nausea: Secondary | ICD-10-CM | POA: Insufficient documentation

## 2014-01-02 DIAGNOSIS — K5090 Crohn's disease, unspecified, without complications: Secondary | ICD-10-CM | POA: Insufficient documentation

## 2014-01-02 DIAGNOSIS — R109 Unspecified abdominal pain: Secondary | ICD-10-CM

## 2014-01-02 DIAGNOSIS — D649 Anemia, unspecified: Secondary | ICD-10-CM | POA: Insufficient documentation

## 2014-01-02 DIAGNOSIS — Z880 Allergy status to penicillin: Secondary | ICD-10-CM | POA: Insufficient documentation

## 2014-01-02 DIAGNOSIS — IMO0002 Reserved for concepts with insufficient information to code with codable children: Secondary | ICD-10-CM | POA: Insufficient documentation

## 2014-01-02 DIAGNOSIS — Z79899 Other long term (current) drug therapy: Secondary | ICD-10-CM | POA: Insufficient documentation

## 2014-01-02 HISTORY — DX: Crohn's disease, unspecified, without complications: K50.90

## 2014-01-02 MED ORDER — ONDANSETRON 4 MG PO TBDP: ORAL_TABLET | ORAL | Status: DC

## 2014-01-02 MED ORDER — OXYCODONE-ACETAMINOPHEN 5-325 MG PO TABS
2.0000 | ORAL_TABLET | Freq: Once | ORAL | Status: AC
  Administered 2014-01-02: 2 via ORAL
  Filled 2014-01-02: qty 2

## 2014-01-02 MED ORDER — PREDNISONE 10 MG PO TABS: 20.0000 mg | ORAL_TABLET | Freq: Every day | ORAL | Status: DC

## 2014-01-02 NOTE — ED Provider Notes (Addendum)
CSN: [id number]     Arrival date & time 01/02/14  1441 History  This chart was scribed for Blanchard Kelch, MD by Mercy Moore, ED scribe.  This patient was seen in room MH03/MH03 and the patient's care was started at 4:00 PM.   Chief Complaint  Patient presents with  . Abdominal Pain     Patient is a 23 y.o. female presenting with abdominal pain. The history is provided by the patient and a parent. No language interpreter was used.  Abdominal Pain Pain location:  Generalized Pain quality: cramping   Pain radiates to:  Does not radiate Pain severity:  Moderate Onset quality:  Gradual Duration:  5 days Timing:  Intermittent Progression:  Unchanged Chronicity:  Recurrent Context: not sick contacts   Relieved by: Prednisone  Worsened by:  Nothing tried Associated symptoms: diarrhea, fatigue and nausea   Associated symptoms: no chest pain, no cough, no dysuria, no fever and no vomiting   Diarrhea:    Timing:  Intermittent  HPI Comments:  Beverly Richards is a 23 y.o. female with Crohn's disease brought in by parents to the Emergency Department complaining of intermittent, cramping abdominal pain. Patient reports that she was diagnosed with Crohn's disease in January at [facility] after being admitted to the hospital. Patient reports that Sunday she ran out of her prescribed medication and the pain represented. Patient reports that the abdominal pain has been preventing her from working and sleeping at night. Associated symptoms include diarrhea, nausea, and headache. Patient reports that the pain is similar to the pain she felt in January at diagnosis. Patient requests a new prescription for Prednisone to manage her pain. Mother reports that she and the patient are looking to find medical coverage for her, but they have been unsuccessful.     Past Medical History  Diagnosis Date  . Ovarian cyst   . Anemia   . Monocytosis 10/21/2013  . Crohn's disease    Past Surgical History   Procedure Laterality Date  . No past surgeries    . Colonoscopy N/A 10/23/2013    Procedure: COLONOSCOPY;  Surgeon: Missy Sabins, MD;  Location: [facility];  Service: Endoscopy;  Laterality: N/A;   No family history on file. History  Substance Use Topics  . Smoking status: Never Smoker   . Smokeless tobacco: Never Used  . Alcohol Use: No   OB History   Grav Para Term Preterm Abortions TAB SAB Ect Mult Living                 Review of Systems  Constitutional: Positive for fatigue. Negative for fever.  HENT: Negative for congestion and dental problem.   Eyes: Negative for discharge.  Respiratory: Negative for cough and chest tightness.   Cardiovascular: Negative for chest pain.  Gastrointestinal: Positive for nausea, abdominal pain and diarrhea. Negative for vomiting and blood in stool.  Endocrine: Negative for polyuria.  Genitourinary: Negative for dysuria.  Musculoskeletal: Negative for back pain.  Skin: Negative for rash.  Allergic/Immunologic: Negative for immunocompromised state.  Neurological: Positive for headaches. Negative for dizziness.  Hematological: Does not bruise/bleed easily.  Psychiatric/Behavioral: Negative for behavioral problems.      Allergies  Benadryl and Penicillins  Home Medications   Current Outpatient Rx  Name  Route  Sig  Dispense  Refill  . ferrous sulfate 325 (65 FE) MG tablet   Oral   Take 1 tablet (325 mg total) by mouth 2 (two) times daily with a meal.  60 tablet   3   . HYDROcodone-acetaminophen (NORCO/VICODIN) 5-325 MG per tablet   Oral   Take 1-2 tablets by mouth every 4 (four) hours as needed for moderate pain.   45 tablet   0   . mesalamine (PENTASA) 250 MG CR capsule   Oral   Take 4 capsules (1,000 mg total) by mouth 2 (two) times daily.   240 capsule   1   . Multiple Vitamin (MULTIVITAMIN WITH MINERALS) TABS tablet   Oral   Take 1 tablet by mouth every morning.         . ondansetron (ZOFRAN ODT) 4 MG  disintegrating tablet   Oral   Take 1 tablet (4 mg total) by mouth every 8 (eight) hours as needed for nausea or vomiting.   20 tablet   0   . OVER THE COUNTER MEDICATION   Oral   Take 1 tablet by mouth daily as needed (For cold symptoms.). Airborne Gummies for Adults         . predniSONE (DELTASONE) 20 MG tablet   Oral   Take 2 tablets (40 mg total) by mouth daily with breakfast.   60 tablet   0   . ranitidine (ZANTAC) 75 MG tablet   Oral   Take 75 mg by mouth 2 (two) times daily as needed for heartburn.         . Simethicone (GAS-X PO)   Oral   Take 1 tablet by mouth 4 (four) times daily as needed (For gas pain.).          Marland Kitchen UNABLE TO FIND      [name] was admitted to [facility] from 10/21/13 through 10/24/13.  She is medically stable to return to work and school.   1 Act   0   . UNABLE TO FIND      [name] was admitted to [facility] from 10/21/13 through 10/24/13.  She is medically stable to return to work and school.   1 Act   0    Triage Vitals: BP 122/75  Pulse 104  Temp(Src) 98.4 F (36.9 C) (Oral)  Resp 20  Ht 5' 2.5" (1.588 m)  Wt 128 lb 4 oz (58.174 kg)  BMI 23.07 kg/m2  SpO2 100%  LMP 01/02/2014 Physical Exam  Nursing note and vitals reviewed. Constitutional: She is oriented to person, place, and time. She appears well-developed and well-nourished. No distress.  HENT:  Head: Normocephalic and atraumatic.  Mouth/Throat: Oropharynx is clear and moist. No oropharyngeal exudate.  Eyes: Conjunctivae and EOM are normal. Pupils are equal, round, and reactive to light. Right eye exhibits no discharge. Left eye exhibits no discharge.  Neck: Neck supple. No tracheal deviation present.  Cardiovascular: Normal rate, regular rhythm, normal heart sounds and intact distal pulses.  Exam reveals no gallop and no friction rub.   No murmur heard. Pulmonary/Chest: Effort normal and breath sounds normal. No respiratory distress. She  has no wheezes. She has no rales. She exhibits no tenderness.  Abdominal: Soft. Bowel sounds are normal. She exhibits no distension and no mass. There is no tenderness. There is no rebound and no guarding.  Mild non-focal, diffuse tenderness of abdomen.   Musculoskeletal: Normal range of motion. She exhibits no edema and no tenderness.  Neurological: She is alert and oriented to person, place, and time.  Skin: Skin is warm and dry. No rash noted. No erythema.  Psychiatric: She has a normal mood and affect. Her  behavior is normal.    ED Course  Procedures (including critical care time) DIAGNOSTIC STUDIES: Oxygen Saturation is 100% on room air, normal by my interpretation.    COORDINATION OF CARE: 4:05 PM- Will prescribed exhausted medications. Patient advised to return if her symptoms do not improve. Pt advised of plan for treatment and pt agrees.    Labs Review Labs Reviewed - No data to display Imaging Review No results found.   EKG Interpretation None      MDM   Final diagnoses:  Abdominal pain    11:53 PM 23 y.o. female who presents with recurrence of abdominal pain after running out of her medications last Sunday. She notes that her symptoms are similar to when she was diagnosed with crohns in January. She denies any fevers or bloody stools. She is afebrile and mildly tachycardic here. She is requesting a refill on her medications. I offered screening labwork and imaging but agree with the family that this is likely not necessary as her sx recurred only after running out of her meds. They would like a refill, I think this is reasonable. They have tried multiple sources to get her medications paid for including discussions w/ SW which I offered. Will provide Rx for zofran and prednisone.   11:56 PM: I gave the patient return precautions for any worsening and explained possible complications with Crohn's disease. The family and the patient understand. I have discussed the  diagnosis/risks/treatment options with the patient and believe the pt to be eligible for discharge home to follow-up with pcp next week. We also discussed returning to the ED immediately if new or worsening sx occur. We discussed the sx which are most concerning (e.g., worsening pain, fever) that necessitate immediate return. Medications administered to the patient during their visit and any new prescriptions provided to the patient are listed below.  Medications given during this visit Medications  oxyCODONE-acetaminophen (PERCOCET/ROXICET) 5-325 MG per tablet 2 tablet (2 tablets Oral Given 01/02/14 1620)    Discharge Medication List as of 01/02/2014  4:07 PM    START taking these medications   Details  !! ondansetron (ZOFRAN ODT) 4 MG disintegrating tablet 4mg  ODT q4 hours prn nausea/vomit, Print    !! predniSONE (DELTASONE) 10 MG tablet Take 2 tablets (20 mg total) by mouth daily with breakfast., Starting 01/02/2014, Until Discontinued, Print     !! - Potential duplicate medications found. Please discuss with provider.        I personally performed the services described in this documentation, which was scribed in my presence. The recorded information has been reviewed and is accurate.    Blanchard Kelch, MD 01/02/14 7858  Blanchard Kelch, MD 01/02/14 2358

## 2014-01-02 NOTE — ED Notes (Signed)
Diagnosed with Crohns disease in January.  Has run out of her medications and is requesting a refill.  Reports unable to see a PMD d/t insurance issues.  States the abdominal pain has returned since she hasn't been taking her medications.  Ran out last week.

## 2014-05-12 ENCOUNTER — Encounter (HOSPITAL_BASED_OUTPATIENT_CLINIC_OR_DEPARTMENT_OTHER): Payer: Self-pay | Admitting: Emergency Medicine

## 2014-05-12 ENCOUNTER — Emergency Department (HOSPITAL_BASED_OUTPATIENT_CLINIC_OR_DEPARTMENT_OTHER)
Admission: EM | Admit: 2014-05-12 | Discharge: 2014-05-12 | Disposition: A | Discharge status: Home / Self Care | Payer: 59 | Attending: Emergency Medicine | Admitting: Emergency Medicine

## 2014-05-12 DIAGNOSIS — Z880 Allergy status to penicillin: Secondary | ICD-10-CM | POA: Diagnosis not present

## 2014-05-12 DIAGNOSIS — Z862 Personal history of diseases of the blood and blood-forming organs and certain disorders involving the immune mechanism: Secondary | ICD-10-CM | POA: Insufficient documentation

## 2014-05-12 DIAGNOSIS — Z8742 Personal history of other diseases of the female genital tract: Secondary | ICD-10-CM | POA: Diagnosis not present

## 2014-05-12 DIAGNOSIS — N390 Urinary tract infection, site not specified: Secondary | ICD-10-CM | POA: Insufficient documentation

## 2014-05-12 DIAGNOSIS — R109 Unspecified abdominal pain: Secondary | ICD-10-CM | POA: Diagnosis present

## 2014-05-12 DIAGNOSIS — K5090 Crohn's disease, unspecified, without complications: Secondary | ICD-10-CM | POA: Diagnosis not present

## 2014-05-12 DIAGNOSIS — Z79899 Other long term (current) drug therapy: Secondary | ICD-10-CM | POA: Insufficient documentation

## 2014-05-12 DIAGNOSIS — R1030 Lower abdominal pain, unspecified: Secondary | ICD-10-CM

## 2014-05-12 DIAGNOSIS — Z3202 Encounter for pregnancy test, result negative: Secondary | ICD-10-CM | POA: Insufficient documentation

## 2014-05-12 DIAGNOSIS — IMO0002 Reserved for concepts with insufficient information to code with codable children: Secondary | ICD-10-CM | POA: Insufficient documentation

## 2014-05-12 LAB — CBC WITH DIFFERENTIAL/PLATELET
BASOS PCT: 0 % (ref 0–1)
Basophils Absolute: 0 10*3/uL (ref 0.0–0.1)
EOS ABS: 0 10*3/uL (ref 0.0–0.7)
Eosinophils Relative: 0 % (ref 0–5)
HCT: 34.8 % — ABNORMAL LOW (ref 36.0–46.0)
Hemoglobin: 11.3 g/dL — ABNORMAL LOW (ref 12.0–15.0)
Lymphocytes Relative: 9 % — ABNORMAL LOW (ref 12–46)
Lymphs Abs: 0.5 10*3/uL — ABNORMAL LOW (ref 0.7–4.0)
MCH: 25 pg — AB (ref 26.0–34.0)
MCHC: 32.5 g/dL (ref 30.0–36.0)
MCV: 77 fL — ABNORMAL LOW (ref 78.0–100.0)
Monocytes Absolute: 0.8 10*3/uL (ref 0.1–1.0)
Monocytes Relative: 15 % — ABNORMAL HIGH (ref 3–12)
NEUTROS PCT: 76 % (ref 43–77)
Neutro Abs: 4.1 10*3/uL (ref 1.7–7.7)
Platelets: 576 10*3/uL — ABNORMAL HIGH (ref 150–400)
RBC: 4.52 MIL/uL (ref 3.87–5.11)
RDW: 14.3 % (ref 11.5–15.5)
WBC: 5.4 10*3/uL (ref 4.0–10.5)

## 2014-05-12 LAB — URINALYSIS, ROUTINE W REFLEX MICROSCOPIC
Bilirubin Urine: NEGATIVE
GLUCOSE, UA: NEGATIVE mg/dL
Ketones, ur: 15 mg/dL — AB
Nitrite: NEGATIVE
Protein, ur: 30 mg/dL — AB
SPECIFIC GRAVITY, URINE: 1.031 — AB (ref 1.005–1.030)
Urobilinogen, UA: 1 mg/dL (ref 0.0–1.0)
pH: 6 (ref 5.0–8.0)

## 2014-05-12 LAB — COMPREHENSIVE METABOLIC PANEL
ALBUMIN: 3.3 g/dL — AB (ref 3.5–5.2)
ALT: 15 U/L (ref 0–35)
ANION GAP: 16 — AB (ref 5–15)
AST: 15 U/L (ref 0–37)
Alkaline Phosphatase: 100 U/L (ref 39–117)
BUN: 7 mg/dL (ref 6–23)
CO2: 23 mEq/L (ref 19–32)
Calcium: 10.2 mg/dL (ref 8.4–10.5)
Chloride: 100 mEq/L (ref 96–112)
Creatinine, Ser: 0.7 mg/dL (ref 0.50–1.10)
GFR calc Af Amer: >90 mL/min (ref >90)
GFR calc non Af Amer: >90 mL/min (ref >90)
Glucose, Bld: 114 mg/dL — ABNORMAL HIGH (ref 70–99)
Potassium: 3.5 mEq/L — ABNORMAL LOW (ref 3.7–5.3)
SODIUM: 139 meq/L (ref 137–147)
TOTAL PROTEIN: 9.2 g/dL — AB (ref 6.0–8.3)
Total Bilirubin: 0.4 mg/dL (ref 0.3–1.2)

## 2014-05-12 LAB — URINE MICROSCOPIC-ADD ON

## 2014-05-12 LAB — PREGNANCY, URINE: Preg Test, Ur: NEGATIVE

## 2014-05-12 LAB — LIPASE, BLOOD: Lipase: 26 U/L (ref 11–59)

## 2014-05-12 MED ORDER — HYDROMORPHONE HCL PF 1 MG/ML IJ SOLN
1.0000 mg | Freq: Once | INTRAMUSCULAR | Status: AC
  Administered 2014-05-12: 1 mg via INTRAVENOUS
  Filled 2014-05-12: qty 1

## 2014-05-12 MED ORDER — ONDANSETRON HCL 4 MG PO TABS: 4.0000 mg | ORAL_TABLET | Freq: Four times a day (QID) | ORAL | Status: DC

## 2014-05-12 MED ORDER — PREDNISONE 10 MG PO TABS: 20.0000 mg | ORAL_TABLET | Freq: Every day | ORAL | Status: DC

## 2014-05-12 MED ORDER — ONDANSETRON HCL 4 MG/2ML IJ SOLN
4.0000 mg | Freq: Once | INTRAMUSCULAR | Status: AC
  Administered 2014-05-12: 4 mg via INTRAVENOUS
  Filled 2014-05-12: qty 2

## 2014-05-12 MED ORDER — NITROFURANTOIN MONOHYD MACRO 100 MG PO CAPS: 100.0000 mg | ORAL_CAPSULE | Freq: Two times a day (BID) | ORAL | Status: DC

## 2014-05-12 NOTE — ED Provider Notes (Signed)
CSN: [id number]     Arrival date & time 05/12/14  1536 History   First MD Initiated Contact with Patient 05/12/14 1546     Chief Complaint  Patient presents with  . Abdominal Pain     (Consider location/radiation/quality/duration/timing/severity/associated sxs/prior Treatment) HPI Comments: Pt c/o lower abdominal pain that started 3 days ago. Pt was diagnosed with crohns 8 months ago. No fever. She is having vomiting and diarrhea. Denies bloody stools. Pt has not seen gi since her diagnosis. She is taking prednisone every day.  The history is provided by the patient. No language interpreter was used.    Past Medical History  Diagnosis Date  . Ovarian cyst   . Anemia   . Monocytosis 10/21/2013  . Crohn's disease    Past Surgical History  Procedure Laterality Date  . No past surgeries    . Colonoscopy N/A 10/23/2013    Procedure: COLONOSCOPY;  Surgeon: Missy Sabins, MD;  Location: [facility];  Service: Endoscopy;  Laterality: N/A;   No family history on file. History  Substance Use Topics  . Smoking status: Never Smoker   . Smokeless tobacco: Never Used  . Alcohol Use: No   OB History   Grav Para Term Preterm Abortions TAB SAB Ect Mult Living                 Review of Systems  Respiratory: Negative.   Cardiovascular: Negative.       Allergies  Benadryl and Penicillins  Home Medications   Prior to Admission medications   Medication Sig Start Date End Date Taking? Authorizing Provider  ferrous sulfate 325 (65 FE) MG tablet Take 1 tablet (325 mg total) by mouth 2 (two) times daily with a meal. 10/24/13   Orson Eva, MD  HYDROcodone-acetaminophen (NORCO/VICODIN) 5-325 MG per tablet Take 1-2 tablets by mouth every 4 (four) hours as needed for moderate pain. 10/24/13   Orson Eva, MD  mesalamine (PENTASA) 250 MG CR capsule Take 4 capsules (1,000 mg total) by mouth 2 (two) times daily. 10/24/13   Orson Eva, MD  Multiple Vitamin (MULTIVITAMIN WITH MINERALS) TABS tablet Take  1 tablet by mouth every morning.    Historical Provider, MD  ondansetron (ZOFRAN ODT) 4 MG disintegrating tablet Take 1 tablet (4 mg total) by mouth every 8 (eight) hours as needed for nausea or vomiting. 10/24/13   Orson Eva, MD  ondansetron (ZOFRAN ODT) 4 MG disintegrating tablet 4mg  ODT q4 hours prn nausea/vomit 01/02/14   Pamella Pert, MD  OVER THE COUNTER MEDICATION Take 1 tablet by mouth daily as needed (For cold symptoms.). Airborne Gummies for Adults    Historical Provider, MD  predniSONE (DELTASONE) 10 MG tablet Take 2 tablets (20 mg total) by mouth daily with breakfast. 01/02/14   Pamella Pert, MD  predniSONE (DELTASONE) 20 MG tablet Take 2 tablets (40 mg total) by mouth daily with breakfast. 10/24/13   Orson Eva, MD  ranitidine (ZANTAC) 75 MG tablet Take 75 mg by mouth 2 (two) times daily as needed for heartburn.    Historical Provider, MD  Simethicone (GAS-X PO) Take 1 tablet by mouth 4 (four) times daily as needed (For gas pain.).     Historical Provider, MD  UNABLE TO FIND Ms. Yim was admitted to [facility] from 10/21/13 through 10/24/13.  She is medically stable to return to work and school. 10/24/13   Orson Eva, MD  UNABLE TO FIND Ms. Stachowski was admitted to [facility] from 10/21/13  through 10/24/13.  She is medically stable to return to work and school. 10/24/13   Orson Eva, MD   BP 123/74  Pulse 76  Temp(Src) 98 F (36.7 C) (Oral)  Resp 18  Ht 5\' 3"  (1.6 m)  Wt 127 lb 8 oz (57.834 kg)  BMI 22.59 kg/m2  SpO2 100%  LMP 04/26/2014 Physical Exam  Nursing note and vitals reviewed. Constitutional: She is oriented to person, place, and time. She appears well-developed and well-nourished.  Cardiovascular: Normal rate and regular rhythm.   Pulmonary/Chest: Effort normal and breath sounds normal.  Abdominal: Soft. Bowel sounds are normal.  Diffuse tenderness  Musculoskeletal: Normal range of motion.  Neurological: She is alert and oriented to person,  place, and time.  Skin: Skin is warm and dry.    ED Course  Procedures (including critical care time) Labs Review Labs Reviewed  URINALYSIS, ROUTINE W REFLEX MICROSCOPIC - Abnormal; Notable for the following:    Color, Urine AMBER (*)    APPearance CLOUDY (*)    Specific Gravity, Urine 1.031 (*)    Hgb urine dipstick LARGE (*)    Ketones, ur 15 (*)    Protein, ur 30 (*)    Leukocytes, UA MODERATE (*)    All other components within normal limits  URINE MICROSCOPIC-ADD ON - Abnormal; Notable for the following:    Squamous Epithelial / LPF MANY (*)    Bacteria, UA FEW (*)    All other components within normal limits  CBC WITH DIFFERENTIAL - Abnormal; Notable for the following:    Hemoglobin 11.3 (*)    HCT 34.8 (*)    MCV 77.0 (*)    MCH 25.0 (*)    Platelets 576 (*)    Lymphocytes Relative 9 (*)    Lymphs Abs 0.5 (*)    Monocytes Relative 15 (*)    All other components within normal limits  COMPREHENSIVE METABOLIC PANEL - Abnormal; Notable for the following:    Potassium 3.5 (*)    Glucose, Bld 114 (*)    Total Protein 9.2 (*)    Albumin 3.3 (*)    Anion gap 16 (*)    All other components within normal limits  URINE CULTURE  PREGNANCY, URINE  LIPASE, BLOOD    Imaging Review No results found.   EKG Interpretation None      MDM   Final diagnoses:  UTI (lower urinary tract infection)  Lower abdominal pain    Think pain likely related to uti.don't think that it related to crohns. Discussed with pt that will give her chronic prednisone refill but she really needs to follow up with a pcp and gi    Glendell Docker, NP 05/12/14 2208

## 2014-05-12 NOTE — ED Provider Notes (Signed)
Medical screening examination/treatment/procedure(s) were performed by non-physician practitioner and as supervising physician I was immediately available for consultation/collaboration.   EKG Interpretation None        Evelina Bucy, MD 05/12/14 2255

## 2014-05-12 NOTE — ED Notes (Addendum)
Pt. States that she began having abd pain over her lower abd this weekend. Pt. States that she feels bloated and has N/V/D starting Monday. Denies tenderness to abd. Able to keep down broth and rice. Pt. Has hx of crohns (Jan. 2015).

## 2014-05-12 NOTE — Discharge Instructions (Signed)

## 2014-05-12 NOTE — ED Notes (Signed)
Flare up of Crohns x3 days. Low abd pain with v/d.

## 2014-05-13 LAB — URINE CULTURE
COLONY COUNT: NO GROWTH
CULTURE: NO GROWTH

## 2014-07-12 ENCOUNTER — Encounter (HOSPITAL_BASED_OUTPATIENT_CLINIC_OR_DEPARTMENT_OTHER): Payer: Self-pay | Admitting: Emergency Medicine

## 2014-07-12 ENCOUNTER — Emergency Department (HOSPITAL_BASED_OUTPATIENT_CLINIC_OR_DEPARTMENT_OTHER)
Admission: EM | Admit: 2014-07-12 | Discharge: 2014-07-12 | Disposition: A | Discharge status: Home / Self Care | Payer: 59 | Attending: Emergency Medicine | Admitting: Emergency Medicine

## 2014-07-12 DIAGNOSIS — R1084 Generalized abdominal pain: Secondary | ICD-10-CM | POA: Insufficient documentation

## 2014-07-12 DIAGNOSIS — Z880 Allergy status to penicillin: Secondary | ICD-10-CM | POA: Insufficient documentation

## 2014-07-12 DIAGNOSIS — R109 Unspecified abdominal pain: Secondary | ICD-10-CM | POA: Diagnosis present

## 2014-07-12 DIAGNOSIS — D649 Anemia, unspecified: Secondary | ICD-10-CM | POA: Insufficient documentation

## 2014-07-12 DIAGNOSIS — Z9889 Other specified postprocedural states: Secondary | ICD-10-CM | POA: Insufficient documentation

## 2014-07-12 DIAGNOSIS — Z7952 Long term (current) use of systemic steroids: Secondary | ICD-10-CM | POA: Insufficient documentation

## 2014-07-12 DIAGNOSIS — Z79899 Other long term (current) drug therapy: Secondary | ICD-10-CM | POA: Insufficient documentation

## 2014-07-12 DIAGNOSIS — Z3202 Encounter for pregnancy test, result negative: Secondary | ICD-10-CM | POA: Diagnosis not present

## 2014-07-12 DIAGNOSIS — K5090 Crohn's disease, unspecified, without complications: Secondary | ICD-10-CM | POA: Insufficient documentation

## 2014-07-12 LAB — COMPREHENSIVE METABOLIC PANEL
ALBUMIN: 2.8 g/dL — AB (ref 3.5–5.2)
ALT: 7 U/L (ref 0–35)
AST: 9 U/L (ref 0–37)
Alkaline Phosphatase: 82 U/L (ref 39–117)
Anion gap: 15 (ref 5–15)
BUN: 7 mg/dL (ref 6–23)
CO2: 24 mEq/L (ref 19–32)
Calcium: 9.5 mg/dL (ref 8.4–10.5)
Chloride: 99 mEq/L (ref 96–112)
Creatinine, Ser: 0.6 mg/dL (ref 0.50–1.10)
GFR calc Af Amer: >90 mL/min (ref >90)
GFR calc non Af Amer: >90 mL/min (ref >90)
Glucose, Bld: 143 mg/dL — ABNORMAL HIGH (ref 70–99)
Potassium: 3.4 mEq/L — ABNORMAL LOW (ref 3.7–5.3)
SODIUM: 138 meq/L (ref 137–147)
Total Bilirubin: 0.4 mg/dL (ref 0.3–1.2)
Total Protein: 8.1 g/dL (ref 6.0–8.3)

## 2014-07-12 LAB — CBC WITH DIFFERENTIAL/PLATELET
BASOS ABS: 0 10*3/uL (ref 0.0–0.1)
BASOS PCT: 0 % (ref 0–1)
EOS PCT: 0 % (ref 0–5)
Eosinophils Absolute: 0 10*3/uL (ref 0.0–0.7)
HCT: 30.7 % — ABNORMAL LOW (ref 36.0–46.0)
Hemoglobin: 9.8 g/dL — ABNORMAL LOW (ref 12.0–15.0)
LYMPHS PCT: 5 % — AB (ref 12–46)
Lymphs Abs: 0.2 10*3/uL — ABNORMAL LOW (ref 0.7–4.0)
MCH: 24.1 pg — ABNORMAL LOW (ref 26.0–34.0)
MCHC: 31.9 g/dL (ref 30.0–36.0)
MCV: 75.6 fL — AB (ref 78.0–100.0)
Monocytes Absolute: 0.2 10*3/uL (ref 0.1–1.0)
Monocytes Relative: 4 % (ref 3–12)
Neutro Abs: 4.3 10*3/uL (ref 1.7–7.7)
Neutrophils Relative %: 91 % — ABNORMAL HIGH (ref 43–77)
Platelets: 641 10*3/uL — ABNORMAL HIGH (ref 150–400)
RBC: 4.06 MIL/uL (ref 3.87–5.11)
RDW: 14.9 % (ref 11.5–15.5)
WBC: 4.7 10*3/uL (ref 4.0–10.5)

## 2014-07-12 LAB — URINE MICROSCOPIC-ADD ON

## 2014-07-12 LAB — URINALYSIS, ROUTINE W REFLEX MICROSCOPIC
GLUCOSE, UA: NEGATIVE mg/dL
Ketones, ur: 40 mg/dL — AB
Nitrite: NEGATIVE
PH: 6 (ref 5.0–8.0)
Protein, ur: NEGATIVE mg/dL
Specific Gravity, Urine: 1.024 (ref 1.005–1.030)
Urobilinogen, UA: 2 mg/dL — ABNORMAL HIGH (ref 0.0–1.0)

## 2014-07-12 LAB — PREGNANCY, URINE: Preg Test, Ur: NEGATIVE

## 2014-07-12 MED ORDER — SODIUM CHLORIDE 0.9 % IV BOLUS (SEPSIS)
1000.0000 mL | Freq: Once | INTRAVENOUS | Status: AC
  Administered 2014-07-12: 1000 mL via INTRAVENOUS

## 2014-07-12 MED ORDER — HYDROMORPHONE HCL 1 MG/ML IJ SOLN
1.0000 mg | Freq: Once | INTRAMUSCULAR | Status: AC
  Administered 2014-07-12: 1 mg via INTRAVENOUS
  Filled 2014-07-12: qty 1

## 2014-07-12 MED ORDER — HYDROCODONE-ACETAMINOPHEN 5-325 MG PO TABS: ORAL_TABLET | ORAL | Status: DC

## 2014-07-12 MED ORDER — ONDANSETRON HCL 4 MG/2ML IJ SOLN
4.0000 mg | Freq: Once | INTRAMUSCULAR | Status: AC
  Administered 2014-07-12: 4 mg via INTRAVENOUS
  Filled 2014-07-12: qty 2

## 2014-07-12 MED ORDER — ONDANSETRON 4 MG PO TBDP: 4.0000 mg | ORAL_TABLET | Freq: Three times a day (TID) | ORAL | Status: DC | PRN

## 2014-07-12 NOTE — Discharge Instructions (Signed)
Please read and follow all provided instructions.  Your diagnoses today include:  1. Generalized abdominal pain   2. Crohn's disease, without complications     Tests performed today include:  Blood counts and electrolytes  Blood tests to check liver and kidney function  Urine test to look for infection and pregnancy (in women)  Vital signs. See below for your results today.   Medications prescribed:   Vicodin (hydrocodone/acetaminophen) - narcotic pain medication  DO NOT drive or perform any activities that require you to be awake and alert because this medicine can make you drowsy. BE VERY CAREFUL not to take multiple medicines containing Tylenol (also called acetaminophen). Doing so can lead to an overdose which can damage your liver and cause liver failure and possibly death.   Zofran (ondansetron) - for nausea and vomiting  Take any prescribed medications only as directed.  Home care instructions:   Follow any educational materials contained in this packet.  Follow-up instructions: Please follow-up with your primary care provider in the next 3 days for further evaluation of your symptoms. Please follow-up with PCP and GI referrals given to you.   Return instructions:  SEEK IMMEDIATE MEDICAL ATTENTION IF:  The pain does not go away or becomes severe   A temperature above 101F develops   Repeated vomiting occurs (multiple episodes)   The pain becomes localized to portions of the abdomen. The right side could possibly be appendicitis. In an adult, the left lower portion of the abdomen could be colitis or diverticulitis.   Blood is being passed in stools or vomit (bright red or black tarry stools)   You develop chest pain, difficulty breathing, dizziness or fainting, or become confused, poorly responsive, or inconsolable (young children)  If you have any other emergent concerns regarding your health  Additional Information: Abdominal (belly) pain can be caused by  many things. Your caregiver performed an examination and possibly ordered blood/urine tests and imaging (CT scan, x-rays, ultrasound). Many cases can be observed and treated at home after initial evaluation in the emergency department. Even though you are being discharged home, abdominal pain can be unpredictable. Therefore, you need a repeated exam if your pain does not resolve, returns, or worsens. Most patients with abdominal pain don't have to be admitted to the hospital or have surgery, but serious problems like appendicitis and gallbladder attacks can start out as nonspecific pain. Many abdominal conditions cannot be diagnosed in one visit, so follow-up evaluations are very important.  Your vital signs today were: BP 108/63   Pulse 76   Temp(Src) 97.6 F (36.4 C) (Oral)   Resp 16   Ht 5\' 3"  (1.6 m)   Wt 120 lb (54.432 kg)   BMI 21.26 kg/m2   SpO2 100%   LMP 06/28/2014 If your blood pressure (bp) was elevated above 135/85 this visit, please have this repeated by your doctor within one month. --------------

## 2014-07-12 NOTE — ED Notes (Signed)
Pt discharged to home with family. NAD.  

## 2014-07-12 NOTE — ED Notes (Signed)
Abdominal pain x4 days.Diarrhea. Hx of crohn's disease.

## 2014-07-12 NOTE — ED Provider Notes (Signed)
CSN: [id number]     Arrival date & time 07/12/14  1640 History   First MD Initiated Contact with Patient 07/12/14 1825     Chief Complaint  Patient presents with  . Abdominal Pain     (Consider location/radiation/quality/duration/timing/severity/associated sxs/prior Treatment) HPI Comments: Patient with history of Crohn's disease diagnosed in 10/2013 presents with worsening of her typical Crohn's symptoms over the past 2 weeks. Patient is on chronic daily prednisone. She complains of worsening abdominal pain described as sharp and episodic. She also has had multiple episodes of nonbloody diarrhea. Patient has felt these exact same symptoms in the past with her Crohn's flares. Today she developed multiple episodes of vomiting with her pain. No fevers. No chest pain or shortness of breath. Patient denies dysuria, increased frequency or urgency, hematuria. Patient states that she ran out of hydrocodone approximately the time that her symptoms became worse. She has been prescribed mesalamine in the past but has been in able to afford to due to her insurance. She has not followed up with gastroenterology since her hospitalization in January. Patient has had several ED visits for similar symptoms and problems with symptom control.  The history is provided by the patient.    Past Medical History  Diagnosis Date  . Ovarian cyst   . Anemia   . Monocytosis 10/21/2013  . Crohn's disease    Past Surgical History  Procedure Laterality Date  . No past surgeries    . Colonoscopy N/A 10/23/2013    Procedure: COLONOSCOPY;  Surgeon: Missy Sabins, MD;  Location: [facility];  Service: Endoscopy;  Laterality: N/A;   No family history on file. History  Substance Use Topics  . Smoking status: Never Smoker   . Smokeless tobacco: Never Used  . Alcohol Use: No   OB History   Grav Para Term Preterm Abortions TAB SAB Ect Mult Living                 Review of Systems  Constitutional: Negative for fever.   HENT: Negative for rhinorrhea and sore throat.   Eyes: Negative for redness.  Respiratory: Negative for cough.   Cardiovascular: Negative for chest pain and leg swelling.  Gastrointestinal: Positive for nausea, vomiting, abdominal pain and diarrhea. Negative for blood in stool.  Genitourinary: Negative for dysuria, frequency, hematuria, vaginal bleeding and vaginal discharge.  Musculoskeletal: Negative for myalgias.  Skin: Negative for rash.  Neurological: Negative for headaches.   Allergies  Benadryl and Penicillins  Home Medications   Prior to Admission medications   Medication Sig Start Date End Date Taking? Authorizing Provider  ferrous sulfate 325 (65 FE) MG tablet Take 1 tablet (325 mg total) by mouth 2 (two) times daily with a meal. 10/24/13   Orson Eva, MD  HYDROcodone-acetaminophen (NORCO/VICODIN) 5-325 MG per tablet Take 1-2 tablets by mouth every 4 (four) hours as needed for moderate pain. 10/24/13   Orson Eva, MD  mesalamine (PENTASA) 250 MG CR capsule Take 4 capsules (1,000 mg total) by mouth 2 (two) times daily. 10/24/13   Orson Eva, MD  Multiple Vitamin (MULTIVITAMIN WITH MINERALS) TABS tablet Take 1 tablet by mouth every morning.    Historical Provider, MD  nitrofurantoin, macrocrystal-monohydrate, (MACROBID) 100 MG capsule Take 1 capsule (100 mg total) by mouth 2 (two) times daily. 05/12/14   Glendell Docker, NP  ondansetron (ZOFRAN ODT) 4 MG disintegrating tablet Take 1 tablet (4 mg total) by mouth every 8 (eight) hours as needed for nausea or vomiting. 10/24/13  Orson Eva, MD  ondansetron (ZOFRAN ODT) 4 MG disintegrating tablet 4mg  ODT q4 hours prn nausea/vomit 01/02/14   Pamella Pert, MD  ondansetron (ZOFRAN) 4 MG tablet Take 1 tablet (4 mg total) by mouth every 6 (six) hours. 05/12/14   Glendell Docker, NP  OVER THE COUNTER MEDICATION Take 1 tablet by mouth daily as needed (For cold symptoms.). Airborne Gummies for Adults    Historical Provider, MD  predniSONE  (DELTASONE) 10 MG tablet Take 2 tablets (20 mg total) by mouth daily with breakfast. 05/12/14   Glendell Docker, NP  predniSONE (DELTASONE) 20 MG tablet Take 2 tablets (40 mg total) by mouth daily with breakfast. 10/24/13   Orson Eva, MD  ranitidine (ZANTAC) 75 MG tablet Take 75 mg by mouth 2 (two) times daily as needed for heartburn.    Historical Provider, MD  Simethicone (GAS-X PO) Take 1 tablet by mouth 4 (four) times daily as needed (For gas pain.).     Historical Provider, MD  UNABLE TO FIND Ms. Dunwoody was admitted to [facility] from 10/21/13 through 10/24/13.  She is medically stable to return to work and school. 10/24/13   Orson Eva, MD  UNABLE TO FIND Ms. Kapral was admitted to [facility] from 10/21/13 through 10/24/13.  She is medically stable to return to work and school. 10/24/13   Orson Eva, MD   BP 125/81  Pulse 80  Temp(Src) 97.6 F (36.4 C) (Oral)  Resp 18  Ht 5\' 3"  (1.6 m)  Wt 120 lb (54.432 kg)  BMI 21.26 kg/m2  SpO2 100%  LMP 06/28/2014  Physical Exam  Nursing note and vitals reviewed. Constitutional: She appears well-developed and well-nourished.  HENT:  Head: Normocephalic and atraumatic.  Eyes: Conjunctivae are normal. Right eye exhibits no discharge. Left eye exhibits no discharge.  Neck: Normal range of motion. Neck supple.  Cardiovascular: Normal rate, regular rhythm and normal heart sounds.   No murmur heard. Pulmonary/Chest: Effort normal and breath sounds normal.  Abdominal: Soft. Bowel sounds are normal. She exhibits no distension. There is tenderness (mild) in the epigastric area and periumbilical area. There is no rigidity, no rebound, no guarding, no CVA tenderness, no tenderness at McBurney's point and negative Murphy's sign.  Neurological: She is alert.  Skin: Skin is warm and dry.  Psychiatric: She has a normal mood and affect.    ED Course  Procedures (including critical care time) Labs Review Labs Reviewed  URINALYSIS,  ROUTINE W REFLEX MICROSCOPIC - Abnormal; Notable for the following:    Color, Urine AMBER (*)    APPearance CLOUDY (*)    Hgb urine dipstick SMALL (*)    Bilirubin Urine SMALL (*)    Ketones, ur 40 (*)    Urobilinogen, UA 2.0 (*)    Leukocytes, UA MODERATE (*)    All other components within normal limits  URINE MICROSCOPIC-ADD ON - Abnormal; Notable for the following:    Squamous Epithelial / LPF FEW (*)    Bacteria, UA FEW (*)    All other components within normal limits  CBC WITH DIFFERENTIAL - Abnormal; Notable for the following:    Hemoglobin 9.8 (*)    HCT 30.7 (*)    MCV 75.6 (*)    MCH 24.1 (*)    Platelets 641 (*)    Neutrophils Relative % 91 (*)    Lymphocytes Relative 5 (*)    Lymphs Abs 0.2 (*)    All other components within normal limits  COMPREHENSIVE METABOLIC  PANEL - Abnormal; Notable for the following:    Potassium 3.4 (*)    Glucose, Bld 143 (*)    Albumin 2.8 (*)    All other components within normal limits  URINE CULTURE  PREGNANCY, URINE    Imaging Review No results found.   EKG Interpretation None      7:01 PM Patient seen and examined. Work-up initiated. Medications ordered. She was vomiting and having worse pain when I entered room. Will give medications for symptom control and fluids. Will check basic labs.   Vital signs reviewed and are as follows: BP 125/81  Pulse 80  Temp(Src) 97.6 F (36.4 C) (Oral)  Resp 18  Ht 5\' 3"  (1.6 m)  Wt 120 lb (54.432 kg)  BMI 21.26 kg/m2  SpO2 100%  LMP 06/28/2014  8:46 PM Patient is much improved with pain medication and antiemetics. She has received 1 L of fluids. Her abdomen remains soft and non-tender. Patient and family agreeable with discharge to home at this time. Will discharge home with oral pain medication and Zofran for nausea and vomiting. Have also given PCP referrals as well as GI referral and encouraged patient and mother to followup with them within the next week.  The patient was urged to  return to the Emergency Department immediately with worsening of current symptoms, worsening abdominal pain, persistent vomiting, blood noted in stools, fever, or any other concerns. The patient verbalized understanding.   Patient counseled on use of narcotic pain medications. Counseled not to combine these medications with others containing tylenol. Urged not to drink alcohol, drive, or perform any other activities that requires focus while taking these medications. The patient verbalizes understanding and agrees with the plan.  MDM   Final diagnoses:  Generalized abdominal pain  Crohn's disease, without complications   Patient with abd pain, N/V/D likely associate with her underlying Crohn's disease. Her symptoms are relatively mild. Her vital signs are normal. She appears well, nontoxic. Her labs are at her baseline. She does not have any focal abdominal pain or fever to suggest abscess or intra-abdominal infection. She does not have any constipation or persistent vomiting to suggest obstruction. Patient to continue her daily prednisone. Will discharge to home with medications to manage her symptoms. She is strongly encouraged to followup with PCP/GI for consideration of controlling medications to prevent flares of Crohn's.  No dangerous or life-threatening conditions suspected or identified by history, physical exam, and by work-up. No indications for hospitalization identified.     Carlisle Cater, PA-C 07/12/14 2050

## 2014-07-12 NOTE — ED Notes (Signed)
Pt. Report she has had abd. Pain as well.  Pt. Goes back and forth with symptoms from abd. Pain to nausea to vomiting and then to diarrhea.  Pt. Tells RN mostly diarrhea symptoms then EDP abd. Pain and vomiting.

## 2014-07-13 NOTE — ED Provider Notes (Signed)
Medical screening examination/treatment/procedure(s) were performed by non-physician practitioner and as supervising physician I was immediately available for consultation/collaboration.   EKG Interpretation None       Charlesetta Shanks, MD 07/13/14 [phone]

## 2014-07-14 LAB — URINE CULTURE
COLONY COUNT: NO GROWTH
Culture: NO GROWTH

## 2014-07-27 ENCOUNTER — Encounter: Payer: Self-pay | Admitting: Physician Assistant

## 2014-07-27 ENCOUNTER — Ambulatory Visit (INDEPENDENT_AMBULATORY_CARE_PROVIDER_SITE_OTHER): Payer: 59 | Admitting: Physician Assistant

## 2014-07-27 VITALS — BP 141/95 | HR 63 | Temp 98.4°F | Ht 62.25 in | Wt 119.4 lb

## 2014-07-27 DIAGNOSIS — Z23 Encounter for immunization: Secondary | ICD-10-CM

## 2014-07-27 DIAGNOSIS — K5090 Crohn's disease, unspecified, without complications: Secondary | ICD-10-CM

## 2014-07-27 MED ORDER — MESALAMINE ER 250 MG PO CPCR: 1000.0000 mg | ORAL_CAPSULE | Freq: Two times a day (BID) | ORAL | Status: DC

## 2014-07-27 MED ORDER — HYDROCODONE-ACETAMINOPHEN 5-325 MG PO TABS: ORAL_TABLET | ORAL | Status: DC

## 2014-07-27 MED ORDER — ONDANSETRON 4 MG PO TBDP: 4.0000 mg | ORAL_TABLET | Freq: Three times a day (TID) | ORAL | Status: DC | PRN

## 2014-07-27 NOTE — Patient Instructions (Signed)
Please resume Mesalamine as directed.  Use Norco/Vicodin for breakthrough pain.  You will be contacted by Gastroenterology for an appointment.  It is very important that you go to this appointment.  Follow-up with me in 3-4 weeks.  Return sooner if needed.  Crohn Disease Crohn disease is a long-term (chronic) soreness and redness (inflammation) of the intestines (bowel). It can affect any portion of the digestive tract, from the mouth to the anus. It can also cause problems outside the digestive tract. Crohn disease is closely related to a disease called ulcerative colitis (together, these two diseases are called inflammatory bowel disease).  CAUSES  The cause of Crohn disease is not known. One Link Snuffer is that, in an easily affected person, the immune system is triggered to attack the body's own digestive tissue. Crohn disease runs in families. It seems to be more common in certain geographic areas and amongst certain races. There are no clear-cut dietary causes.  SYMPTOMS  Crohn disease can cause many different symptoms since it can affect many different parts of the body. Symptoms include:  Fatigue.  Weight loss.  Chronic diarrhea, sometime bloody.  Abdominal pain and cramps.  Fever.  Ulcers or canker sores in the mouth or rectum.  Anemia (low red blood cells).  Arthritis, skin problems, and eye problems may occur. Complications of Crohn disease can include:  Series of holes (perforation) of the bowel.  Portions of the intestines sticking to each other (adhesions).  Obstruction of the bowel.  Fistula formation, typically in the rectal area but also in other areas. A fistula is an opening between the bowels and the outside, or between the bowels and another organ.  A painful crack in the mucous membrane of the anus (rectal fissure). DIAGNOSIS  Your caregiver may suspect Crohn disease based on your symptoms and an exam. Blood tests may confirm that there is a problem. You may be  asked to submit a stool specimen for examination. X-rays and CT scans may be necessary. Ultimately, the diagnosis is usually made after a procedure that uses a flexible tube that is inserted via your mouth or your anus. This is done under sedation and is called either an upper endoscopy or colonoscopy. With these tests, the specialist can take tiny tissue samples and remove them from the inside of the bowel (biopsy). Examination of this biopsy tissue under a microscope can reveal Crohn disease as the cause of your symptoms. Due to the many different forms that Crohn disease can take, symptoms may be present for several years before a diagnosis is made. TREATMENT  Medications are often used to decrease inflammation and control the immune system. These include medicines related to aspirin, steroid medications, and newer and stronger medications to slow down the immune system. Some medications may be used as suppositories or enemas. A number of other medications are used or have been studied. Your caregiver will make specific recommendations. HOME CARE INSTRUCTIONS   Symptoms such as diarrhea can be controlled with medications. Avoid foods that have a laxative effect such as fresh fruit, vegetables, and dairy products. During flare-ups, you can rest your bowel by refraining from solid foods. Drink clear liquids frequently during the day. (Electrolyte or rehydrating fluids are best. Your caregiver can help you with suggestions.) Drink often to prevent loss of body fluids (dehydration). When diarrhea has cleared, eat small meals and more frequently. Avoid food additives and stimulants such as caffeine (coffee, tea, or chocolate). Enzyme supplements may help if you develop intolerance to  a sugar in dairy products (lactose). Ask your caregiver or dietitian about specific dietary instructions.  Try to maintain a positive attitude. Learn relaxation techniques such as self-hypnosis, mental imaging, and muscle  relaxation.  If possible, avoid stresses which can aggravate your condition.  Exercise regularly.  Follow your diet.  Always get plenty of rest. SEEK MEDICAL CARE IF:   Your symptoms fail to improve after a week or two of new treatment.  You experience continued weight loss.  You have ongoing cramps or loose bowels.  You develop a new skin rash, skin sores, or eye problems. SEEK IMMEDIATE MEDICAL CARE IF:   You have worsening of your symptoms or develop new symptoms.  You have a fever.  You develop bloody diarrhea.  You develop severe abdominal pain. MAKE SURE YOU:   Understand these instructions.  Will watch your condition.  Will get help right away if you are not doing well or get worse. Document Released: 06/27/2005 Document Revised: 02/01/2014 Document Reviewed: 05/26/2007 [facility] Patient Information 2015 [place], [place]. This information is not intended to replace advice given to you by your health care provider. Make sure you discuss any questions you have with your health care provider.

## 2014-07-27 NOTE — Assessment & Plan Note (Signed)
Without treatment or GI follow-up.  Mesalamine restarted at 1000 mg BID.  Norco given for breakthrough pain.  Referral to GI placed.  Follow-up in 3 weeks.  Will titrate medication and resume prednisone if indicated at follow-up, only if patient has not re-established with GI at that time.

## 2014-07-27 NOTE — Progress Notes (Signed)
Patient presents to clinic today to establish care.  Crohn's Disease -- Diagnosed in January 2015 via colonoscopy. Previously on Mesalamine with good relief of symptoms.  No GI follow-up at present as she has been out medications.    Anemia  Health Maintenance: Dental -- overdue. Vision -- overdue. Immunizations -- Due for flu shot.  Will be getting today.  Last Tetanus in 2013. Colonoscopy -- 10/2013; diagnosed Crohn's disease; no other concerning findings. PAP -- Last in 10/2013; no abnormal findings.  Past Medical History  Diagnosis Date  . Ovarian cyst   . Anemia   . Monocytosis 10/21/2013  . Crohn's disease     Past Surgical History  Procedure Laterality Date  . No past surgeries    . Colonoscopy N/A 10/23/2013    Procedure: COLONOSCOPY;  Surgeon: Beverly Sabins, MD;  Location: [facility];  Service: Endoscopy;  Laterality: N/A;    Current Outpatient Prescriptions on File Prior to Visit  Medication Sig Dispense Refill  . ferrous sulfate 325 (65 FE) MG tablet Take 1 tablet (325 mg total) by mouth 2 (two) times daily with a meal.  60 tablet  3  . Multiple Vitamin (MULTIVITAMIN WITH MINERALS) TABS tablet Take 1 tablet by mouth every morning.      . predniSONE (DELTASONE) 10 MG tablet Take 2 tablets (20 mg total) by mouth daily with breakfast.  30 tablet  1  . nitrofurantoin, macrocrystal-monohydrate, (MACROBID) 100 MG capsule Take 1 capsule (100 mg total) by mouth 2 (two) times daily.  14 capsule  0  . ondansetron (ZOFRAN) 4 MG tablet Take 1 tablet (4 mg total) by mouth every 6 (six) hours.  12 tablet  0  . OVER THE COUNTER MEDICATION Take 1 tablet by mouth daily as needed (For cold symptoms.). Airborne Gummies for Adults      . predniSONE (DELTASONE) 20 MG tablet Take 2 tablets (40 mg total) by mouth daily with breakfast.  60 tablet  0  . ranitidine (ZANTAC) 75 MG tablet Take 75 mg by mouth 2 (two) times daily as needed for heartburn.      . Simethicone (GAS-X PO) Take 1  tablet by mouth 4 (four) times daily as needed (For gas pain.).       Marland Kitchen UNABLE TO FIND Beverly Richards was admitted to [facility] from 10/21/13 through 10/24/13.  She is medically stable to return to work and school.  1 Act  0  . UNABLE TO FIND Beverly Richards was admitted to [facility] from 10/21/13 through 10/24/13.  She is medically stable to return to work and school.  1 Act  0   No current facility-administered medications on file prior to visit.    Allergies  Allergen Reactions  . Benadryl [Diphenhydramine Hcl] Hives  . Penicillins Hives    Family History  Problem Relation Age of Onset  . Diabetes Mother   . Hyperlipidemia Mother   . Diabetes Maternal Grandmother   . Hyperlipidemia Maternal Grandmother   . Birth defects Paternal Grandmother     Breast Cancer    History   Social History  . Marital Status: Single    Spouse Name: N/A    Number of Children: N/A  . Years of Education: N/A   Occupational History  . Not on file.   Social History Main Topics  . Smoking status: Never Smoker   . Smokeless tobacco: Never Used  . Alcohol Use: No  . Drug Use: No  . Sexual  Activity: No   Other Topics Concern  . Not on file   Social History Narrative  . No narrative on file   ROS See HPI.  All other ROS are negative.  BP 141/95  Pulse 63  Temp(Src) 98.4 F (36.9 C) (Oral)  Ht 5' 2.25" (1.581 m)  Wt 119 lb 6.4 oz (54.159 kg)  BMI 21.67 kg/m2  SpO2 100%  LMP 07/22/2014  Physical Exam  Vitals reviewed. Constitutional: She is oriented to person, place, and time and well-developed, well-nourished, and in no distress.  HENT:  Head: Normocephalic and atraumatic.  Right Ear: External ear normal.  Left Ear: External ear normal.  Nose: Nose normal.  Mouth/Throat: Oropharynx is clear and moist. No oropharyngeal exudate.  TM within normal limits bilaterally.  Eyes: Conjunctivae are normal.  Neck: Neck supple. No thyromegaly present.  Cardiovascular:  Normal rate, regular rhythm, normal heart sounds and intact distal pulses.   Pulmonary/Chest: Effort normal and breath sounds normal. No respiratory distress. She has no wheezes. She has no rales. She exhibits no tenderness.  Abdominal: Soft. Bowel sounds are normal.  + diffuse tenderness without rebound, guarding or palpable mass in patient with untreated Crohn disease.  Lymphadenopathy:    She has no cervical adenopathy.  Neurological: She is alert and oriented to person, place, and time.  Skin: Skin is warm and dry. No rash noted.  Psychiatric: Affect normal.    Recent Results (from the past 2160 hour(s))  URINALYSIS, ROUTINE W REFLEX MICROSCOPIC     Status: Abnormal   Collection Time    05/12/14  3:45 PM      Result Value Ref Range   Color, Urine AMBER (*) YELLOW   Comment: BIOCHEMICALS MAY BE AFFECTED BY COLOR   APPearance CLOUDY (*) CLEAR   Specific Gravity, Urine 1.031 (*) 1.005 - 1.030   pH 6.0  5.0 - 8.0   Glucose, UA NEGATIVE  NEGATIVE mg/dL   Hgb urine dipstick LARGE (*) NEGATIVE   Bilirubin Urine NEGATIVE  NEGATIVE   Ketones, ur 15 (*) NEGATIVE mg/dL   Protein, ur 30 (*) NEGATIVE mg/dL   Urobilinogen, UA 1.0  0.0 - 1.0 mg/dL   Nitrite NEGATIVE  NEGATIVE   Leukocytes, UA MODERATE (*) NEGATIVE  PREGNANCY, URINE     Status: None   Collection Time    05/12/14  3:45 PM      Result Value Ref Range   Preg Test, Ur NEGATIVE  NEGATIVE   Comment:            THE SENSITIVITY OF THIS     METHODOLOGY IS >20 mIU/mL.  URINE MICROSCOPIC-ADD ON     Status: Abnormal   Collection Time    05/12/14  3:45 PM      Result Value Ref Range   Squamous Epithelial / LPF MANY (*) RARE   WBC, UA 11-20  <3 WBC/hpf   RBC / HPF 21-50  <3 RBC/hpf   Bacteria, UA FEW (*) RARE   Urine-Other MUCOUS PRESENT    URINE CULTURE     Status: None   Collection Time    05/12/14  3:45 PM      Result Value Ref Range   Specimen Description URINE, CLEAN CATCH     Special Requests NONE     Culture  Setup  Time       Value: 05/12/2014 23:41     Performed at [facility]       Value: NO  GROWTH     Performed at [facility]   Culture       Value: NO GROWTH     Performed at [facility]   Report Status 05/13/2014 FINAL    CBC WITH DIFFERENTIAL     Status: Abnormal   Collection Time    05/12/14  4:40 PM      Result Value Ref Range   WBC 5.4  4.0 - 10.5 K/uL   RBC 4.52  3.87 - 5.11 MIL/uL   Hemoglobin 11.3 (*) 12.0 - 15.0 g/dL   HCT 34.8 (*) 36.0 - 46.0 %   MCV 77.0 (*) 78.0 - 100.0 fL   MCH 25.0 (*) 26.0 - 34.0 pg   MCHC 32.5  30.0 - 36.0 g/dL   RDW 14.3  11.5 - 15.5 %   Platelets 576 (*) 150 - 400 K/uL   Neutrophils Relative % 76  43 - 77 %   Neutro Abs 4.1  1.7 - 7.7 K/uL   Lymphocytes Relative 9 (*) 12 - 46 %   Lymphs Abs 0.5 (*) 0.7 - 4.0 K/uL   Monocytes Relative 15 (*) 3 - 12 %   Monocytes Absolute 0.8  0.1 - 1.0 K/uL   Eosinophils Relative 0  0 - 5 %   Eosinophils Absolute 0.0  0.0 - 0.7 K/uL   Basophils Relative 0  0 - 1 %   Basophils Absolute 0.0  0.0 - 0.1 K/uL  COMPREHENSIVE METABOLIC PANEL     Status: Abnormal   Collection Time    05/12/14  4:40 PM      Result Value Ref Range   Sodium 139  137 - 147 mEq/L   Potassium 3.5 (*) 3.7 - 5.3 mEq/L   Chloride 100  96 - 112 mEq/L   CO2 23  19 - 32 mEq/L   Glucose, Bld 114 (*) 70 - 99 mg/dL   BUN 7  6 - 23 mg/dL   Creatinine, Ser 0.70  0.50 - 1.10 mg/dL   Calcium 10.2  8.4 - 10.5 mg/dL   Total Protein 9.2 (*) 6.0 - 8.3 g/dL   Albumin 3.3 (*) 3.5 - 5.2 g/dL   AST 15  0 - 37 U/L   ALT 15  0 - 35 U/L   Alkaline Phosphatase 100  39 - 117 U/L   Total Bilirubin 0.4  0.3 - 1.2 mg/dL   GFR calc non Af Amer >90  >90 mL/min   GFR calc Af Amer >90  >90 mL/min   Comment: (NOTE)     The eGFR has been calculated using the CKD EPI equation.     This calculation has not been validated in all clinical situations.     eGFR's persistently <90 mL/min signify possible Chronic Kidney     Disease.     Anion gap 16 (*) 5 - 15  LIPASE, BLOOD     Status: None   Collection Time    05/12/14  4:40 PM      Result Value Ref Range   Lipase 26  11 - 59 U/L  URINALYSIS, ROUTINE W REFLEX MICROSCOPIC     Status: Abnormal   Collection Time    07/12/14  4:50 PM      Result Value Ref Range   Color, Urine AMBER (*) YELLOW   Comment: BIOCHEMICALS MAY BE AFFECTED BY COLOR   APPearance CLOUDY (*) CLEAR   Specific Gravity, Urine 1.024  1.005 - 1.030   pH 6.0  5.0 - 8.0   Glucose, UA NEGATIVE  NEGATIVE mg/dL   Hgb urine dipstick SMALL (*) NEGATIVE   Bilirubin Urine SMALL (*) NEGATIVE   Ketones, ur 40 (*) NEGATIVE mg/dL   Protein, ur NEGATIVE  NEGATIVE mg/dL   Urobilinogen, UA 2.0 (*) 0.0 - 1.0 mg/dL   Nitrite NEGATIVE  NEGATIVE   Leukocytes, UA MODERATE (*) NEGATIVE  PREGNANCY, URINE     Status: None   Collection Time    07/12/14  4:50 PM      Result Value Ref Range   Preg Test, Ur NEGATIVE  NEGATIVE   Comment:            THE SENSITIVITY OF THIS     METHODOLOGY IS >20 mIU/mL.  URINE MICROSCOPIC-ADD ON     Status: Abnormal   Collection Time    07/12/14  4:50 PM      Result Value Ref Range   Squamous Epithelial / LPF FEW (*) RARE   WBC, UA 11-20  <3 WBC/hpf   RBC / HPF 11-20  <3 RBC/hpf   Bacteria, UA FEW (*) RARE   Urine-Other MUCOUS PRESENT    URINE CULTURE     Status: None   Collection Time    07/12/14  4:50 PM      Result Value Ref Range   Specimen Description URINE, RANDOM     Special Requests NONE     Culture  Setup Time       Value: 07/13/2014 09:21     Performed at [facility]       Value: NO GROWTH     Performed at [facility]   Culture       Value: NO GROWTH     Performed at [facility]   Report Status 07/14/2014 FINAL    CBC WITH DIFFERENTIAL     Status: Abnormal   Collection Time    07/12/14  6:50 PM      Result Value Ref Range   WBC 4.7  4.0 - 10.5 K/uL   RBC 4.06  3.87 - 5.11 MIL/uL   Hemoglobin 9.8 (*) 12.0 - 15.0  g/dL   HCT 30.7 (*) 36.0 - 46.0 %   MCV 75.6 (*) 78.0 - 100.0 fL   MCH 24.1 (*) 26.0 - 34.0 pg   MCHC 31.9  30.0 - 36.0 g/dL   RDW 14.9  11.5 - 15.5 %   Platelets 641 (*) 150 - 400 K/uL   Neutrophils Relative % 91 (*) 43 - 77 %   Neutro Abs 4.3  1.7 - 7.7 K/uL   Lymphocytes Relative 5 (*) 12 - 46 %   Lymphs Abs 0.2 (*) 0.7 - 4.0 K/uL   Monocytes Relative 4  3 - 12 %   Monocytes Absolute 0.2  0.1 - 1.0 K/uL   Eosinophils Relative 0  0 - 5 %   Eosinophils Absolute 0.0  0.0 - 0.7 K/uL   Basophils Relative 0  0 - 1 %   Basophils Absolute 0.0  0.0 - 0.1 K/uL  COMPREHENSIVE METABOLIC PANEL     Status: Abnormal   Collection Time    07/12/14  6:50 PM      Result Value Ref Range   Sodium 138  137 - 147 mEq/L   Potassium 3.4 (*) 3.7 - 5.3 mEq/L   Chloride 99  96 - 112 mEq/L   CO2 24  19 - 32 mEq/L   Glucose, Bld  143 (*) 70 - 99 mg/dL   BUN 7  6 - 23 mg/dL   Creatinine, Ser 0.60  0.50 - 1.10 mg/dL   Calcium 9.5  8.4 - 10.5 mg/dL   Total Protein 8.1  6.0 - 8.3 g/dL   Albumin 2.8 (*) 3.5 - 5.2 g/dL   AST 9  0 - 37 U/L   ALT 7  0 - 35 U/L   Alkaline Phosphatase 82  39 - 117 U/L   Total Bilirubin 0.4  0.3 - 1.2 mg/dL   GFR calc non Af Amer >90  >90 mL/min   GFR calc Af Amer >90  >90 mL/min   Comment: (NOTE)     The eGFR has been calculated using the CKD EPI equation.     This calculation has not been validated in all clinical situations.     eGFR's persistently <90 mL/min signify possible Chronic Kidney     Disease.   Anion gap 15  5 - 15    Assessment/Plan: Crohn disease Without treatment or GI follow-up.  Mesalamine restarted at 1000 mg BID.  Norco given for breakthrough pain.  Referral to GI placed.  Follow-up in 3 weeks.  Will titrate medication and resume prednisone if indicated at follow-up, only if patient has not re-established with GI at that time.

## 2014-07-27 NOTE — Progress Notes (Signed)
Pre visit review using our clinic review tool, if applicable. No additional management support is needed unless otherwise documented below in the visit note. 

## 2014-08-24 ENCOUNTER — Encounter: Payer: Self-pay | Admitting: Physician Assistant

## 2014-08-24 ENCOUNTER — Ambulatory Visit (INDEPENDENT_AMBULATORY_CARE_PROVIDER_SITE_OTHER): Payer: 59 | Admitting: Physician Assistant

## 2014-08-24 VITALS — BP 143/85 | HR 72 | Temp 98.6°F | Resp 18 | Wt 120.0 lb

## 2014-08-24 DIAGNOSIS — K5090 Crohn's disease, unspecified, without complications: Secondary | ICD-10-CM

## 2014-08-24 MED ORDER — PREDNISONE 20 MG PO TABS: ORAL_TABLET | ORAL | Status: DC

## 2014-08-24 MED ORDER — PREDNISONE 10 MG PO TABS: 30.0000 mg | ORAL_TABLET | Freq: Every day | ORAL | Status: DC

## 2014-08-24 NOTE — Progress Notes (Signed)
Patient presents to clinic today for follow-up of Crohn disease with acute flare. Patient was started on Mesalamine at last visit but states she did not get it filled due to being too expensive.  Patient endorses continued abdominal discomfort and mucoid stools.  Denies fever, chills, N/V or rectal bleeding at present. Has upcoming appointment with re-establish with GI but cannot be seen until January.  Past Medical History  Diagnosis Date  . Ovarian cyst   . Anemia   . Monocytosis 10/21/2013  . Crohn's disease     Current Outpatient Prescriptions on File Prior to Visit  Medication Sig Dispense Refill  . ferrous sulfate 325 (65 FE) MG tablet Take 1 tablet (325 mg total) by mouth 2 (two) times daily with a meal. 60 tablet 3  . HYDROcodone-acetaminophen (NORCO/VICODIN) 5-325 MG per tablet Take 1-2 tablets every 6 hours as needed for severe pain 60 tablet 0  . Multiple Vitamin (MULTIVITAMIN WITH MINERALS) TABS tablet Take 1 tablet by mouth every morning.    . ondansetron (ZOFRAN ODT) 4 MG disintegrating tablet Take 1 tablet (4 mg total) by mouth every 8 (eight) hours as needed for nausea or vomiting. 10 tablet 0  . ondansetron (ZOFRAN) 4 MG tablet Take 1 tablet (4 mg total) by mouth every 6 (six) hours. 12 tablet 0  . OVER THE COUNTER MEDICATION Take 1 tablet by mouth daily as needed (For cold symptoms.). Airborne Gummies for Adults    . ranitidine (ZANTAC) 75 MG tablet Take 75 mg by mouth 2 (two) times daily as needed for heartburn.    . Simethicone (GAS-X PO) Take 1 tablet by mouth 4 (four) times daily as needed (For gas pain.).     Marland Kitchen UNABLE TO FIND Ms. Bouie was admitted to [facility] from 10/21/13 through 10/24/13.  She is medically stable to return to work and school. 1 Act 0  . UNABLE TO FIND Ms. Koeneman was admitted to [facility] from 10/21/13 through 10/24/13.  She is medically stable to return to work and school. 1 Act 0   No current facility-administered  medications on file prior to visit.    Allergies  Allergen Reactions  . Benadryl [Diphenhydramine Hcl] Hives  . Penicillins Hives    Family History  Problem Relation Age of Onset  . Diabetes Mother   . Hyperlipidemia Mother   . Diabetes Maternal Grandmother   . Hyperlipidemia Maternal Grandmother   . Birth defects Paternal Grandmother     Breast Cancer    History   Social History  . Marital Status: Single    Spouse Name: N/A    Number of Children: N/A  . Years of Education: N/A   Social History Main Topics  . Smoking status: Never Smoker   . Smokeless tobacco: Never Used  . Alcohol Use: No  . Drug Use: No  . Sexual Activity: No   Other Topics Concern  . None   Social History Narrative    Review of Systems - See HPI.  All other ROS are negative.  BP 143/85 mmHg  Pulse 72  Temp(Src) 98.6 F (37 C) (Oral)  Resp 18  Wt 120 lb (54.432 kg)  SpO2 100%  LMP 07/19/2014  Physical Exam  Constitutional: She is well-developed, well-nourished, and in no distress.  HENT:  Head: Normocephalic and atraumatic.  Cardiovascular: Normal rate, regular rhythm, normal heart sounds and intact distal pulses.   Pulmonary/Chest: Effort normal and breath sounds normal. No respiratory distress. She  has no wheezes. She has no rales. She exhibits no tenderness.  Abdominal: Soft. Bowel sounds are normal. She exhibits no distension. There is no tenderness.  Skin: Skin is warm and dry. No rash noted.  Psychiatric: Affect normal.  Vitals reviewed.   Recent Results (from the past 2160 hour(s))  Urinalysis, Routine w reflex microscopic     Status: Abnormal   Collection Time: 07/12/14  4:50 PM  Result Value Ref Range   Color, Urine AMBER (A) YELLOW    Comment: BIOCHEMICALS MAY BE AFFECTED BY COLOR   APPearance CLOUDY (A) CLEAR   Specific Gravity, Urine 1.024 1.005 - 1.030   pH 6.0 5.0 - 8.0   Glucose, UA NEGATIVE NEGATIVE mg/dL   Hgb urine dipstick SMALL (A) NEGATIVE   Bilirubin  Urine SMALL (A) NEGATIVE   Ketones, ur 40 (A) NEGATIVE mg/dL   Protein, ur NEGATIVE NEGATIVE mg/dL   Urobilinogen, UA 2.0 (H) 0.0 - 1.0 mg/dL   Nitrite NEGATIVE NEGATIVE   Leukocytes, UA MODERATE (A) NEGATIVE  Pregnancy, urine     Status: None   Collection Time: 07/12/14  4:50 PM  Result Value Ref Range   Preg Test, Ur NEGATIVE NEGATIVE    Comment:        THE SENSITIVITY OF THIS METHODOLOGY IS >20 mIU/mL.  Urine microscopic-add on     Status: Abnormal   Collection Time: 07/12/14  4:50 PM  Result Value Ref Range   Squamous Epithelial / LPF FEW (A) RARE   WBC, UA 11-20 <3 WBC/hpf   RBC / HPF 11-20 <3 RBC/hpf   Bacteria, UA FEW (A) RARE   Urine-Other MUCOUS PRESENT   Urine culture     Status: None   Collection Time: 07/12/14  4:50 PM  Result Value Ref Range   Specimen Description URINE, RANDOM    Special Requests NONE    Culture  Setup Time      07/13/2014 09:21 Performed at [place] Performed at [facility]    Culture NO GROWTH Performed at [facility]    Report Status 07/14/2014 FINAL   CBC with Differential     Status: Abnormal   Collection Time: 07/12/14  6:50 PM  Result Value Ref Range   WBC 4.7 4.0 - 10.5 K/uL   RBC 4.06 3.87 - 5.11 MIL/uL   Hemoglobin 9.8 (L) 12.0 - 15.0 g/dL   HCT 30.7 (L) 36.0 - 46.0 %   MCV 75.6 (L) 78.0 - 100.0 fL   MCH 24.1 (L) 26.0 - 34.0 pg   MCHC 31.9 30.0 - 36.0 g/dL   RDW 14.9 11.5 - 15.5 %   Platelets 641 (H) 150 - 400 K/uL   Neutrophils Relative % 91 (H) 43 - 77 %   Neutro Abs 4.3 1.7 - 7.7 K/uL   Lymphocytes Relative 5 (L) 12 - 46 %   Lymphs Abs 0.2 (L) 0.7 - 4.0 K/uL   Monocytes Relative 4 3 - 12 %   Monocytes Absolute 0.2 0.1 - 1.0 K/uL   Eosinophils Relative 0 0 - 5 %   Eosinophils Absolute 0.0 0.0 - 0.7 K/uL   Basophils Relative 0 0 - 1 %   Basophils Absolute 0.0 0.0 - 0.1 K/uL  Comprehensive metabolic panel     Status: Abnormal   Collection Time: 07/12/14  6:50 PM    Result Value Ref Range   Sodium 138 137 - 147 mEq/L   Potassium 3.4 (L) 3.7 -  5.3 mEq/L   Chloride 99 96 - 112 mEq/L   CO2 24 19 - 32 mEq/L   Glucose, Bld 143 (H) 70 - 99 mg/dL   BUN 7 6 - 23 mg/dL   Creatinine, Ser 0.60 0.50 - 1.10 mg/dL   Calcium 9.5 8.4 - 10.5 mg/dL   Total Protein 8.1 6.0 - 8.3 g/dL   Albumin 2.8 (L) 3.5 - 5.2 g/dL   AST 9 0 - 37 U/L   ALT 7 0 - 35 U/L   Alkaline Phosphatase 82 39 - 117 U/L   Total Bilirubin 0.4 0.3 - 1.2 mg/dL   GFR calc non Af Amer >90 >90 mL/min   GFR calc Af Amer >90 >90 mL/min    Comment: (NOTE) The eGFR has been calculated using the CKD EPI equation. This calculation has not been validated in all clinical situations. eGFR's persistently <90 mL/min signify possible Chronic Kidney Disease.   Anion gap 15 5 - 15    Assessment/Plan: Crohn disease Will begin Prednisone 40 mg daily x 2 weeks. Then decrease to 30 mg daily x 1 week.  Follow-up in 2 weeks.  Will continue to taper medication by 10 mg weekly.  Follow-up with GI as scheduled.

## 2014-08-24 NOTE — Patient Instructions (Addendum)
Please take prednisone prescriptions as directed -- Will begin with the 20 mg tablets -- Take 2 tablets by mouth daily for 2 weeks.  Then you will begin the other prescription for 10 mg tablets -- Take 3 tablets by mouth daily for 1 week.  Follow-up with me in 2-2.5 weeks.

## 2014-08-24 NOTE — Assessment & Plan Note (Signed)
Will begin Prednisone 40 mg daily x 2 weeks. Then decrease to 30 mg daily x 1 week.  Follow-up in 2 weeks.  Will continue to taper medication by 10 mg weekly.  Follow-up with GI as scheduled.

## 2014-08-24 NOTE — Progress Notes (Signed)
Pre visit review using our clinic review tool, if applicable. No additional management support is needed unless otherwise documented below in the visit note. 

## 2014-08-27 ENCOUNTER — Encounter (HOSPITAL_BASED_OUTPATIENT_CLINIC_OR_DEPARTMENT_OTHER): Payer: Self-pay

## 2014-08-27 ENCOUNTER — Emergency Department (HOSPITAL_BASED_OUTPATIENT_CLINIC_OR_DEPARTMENT_OTHER)
Admission: EM | Admit: 2014-08-27 | Discharge: 2014-08-28 | Disposition: A | Discharge status: Home / Self Care | Payer: 59 | Attending: Emergency Medicine | Admitting: Emergency Medicine

## 2014-08-27 DIAGNOSIS — R109 Unspecified abdominal pain: Secondary | ICD-10-CM | POA: Diagnosis present

## 2014-08-27 DIAGNOSIS — R112 Nausea with vomiting, unspecified: Secondary | ICD-10-CM | POA: Insufficient documentation

## 2014-08-27 DIAGNOSIS — Z880 Allergy status to penicillin: Secondary | ICD-10-CM | POA: Insufficient documentation

## 2014-08-27 DIAGNOSIS — Z8742 Personal history of other diseases of the female genital tract: Secondary | ICD-10-CM | POA: Insufficient documentation

## 2014-08-27 DIAGNOSIS — D500 Iron deficiency anemia secondary to blood loss (chronic): Secondary | ICD-10-CM | POA: Insufficient documentation

## 2014-08-27 DIAGNOSIS — Z79899 Other long term (current) drug therapy: Secondary | ICD-10-CM | POA: Diagnosis not present

## 2014-08-27 DIAGNOSIS — Z3202 Encounter for pregnancy test, result negative: Secondary | ICD-10-CM | POA: Insufficient documentation

## 2014-08-27 DIAGNOSIS — K5090 Crohn's disease, unspecified, without complications: Secondary | ICD-10-CM | POA: Insufficient documentation

## 2014-08-27 LAB — COMPREHENSIVE METABOLIC PANEL
ALT: 10 U/L (ref 0–35)
ANION GAP: 15 (ref 5–15)
AST: 11 U/L (ref 0–37)
Albumin: 3 g/dL — ABNORMAL LOW (ref 3.5–5.2)
Alkaline Phosphatase: 80 U/L (ref 39–117)
BUN: 9 mg/dL (ref 6–23)
CALCIUM: 9.2 mg/dL (ref 8.4–10.5)
CO2: 24 mEq/L (ref 19–32)
Chloride: 103 mEq/L (ref 96–112)
Creatinine, Ser: 0.6 mg/dL (ref 0.50–1.10)
GFR calc Af Amer: >90 mL/min (ref >90)
GFR calc non Af Amer: >90 mL/min (ref >90)
Glucose, Bld: 112 mg/dL — ABNORMAL HIGH (ref 70–99)
Potassium: 3.8 mEq/L (ref 3.7–5.3)
SODIUM: 142 meq/L (ref 137–147)
TOTAL PROTEIN: 7.8 g/dL (ref 6.0–8.3)
Total Bilirubin: 0.3 mg/dL (ref 0.3–1.2)

## 2014-08-27 LAB — URINALYSIS, ROUTINE W REFLEX MICROSCOPIC
BILIRUBIN URINE: NEGATIVE
Glucose, UA: NEGATIVE mg/dL
HGB URINE DIPSTICK: NEGATIVE
Ketones, ur: 15 mg/dL — AB
Leukocytes, UA: NEGATIVE
Nitrite: NEGATIVE
Protein, ur: NEGATIVE mg/dL
Specific Gravity, Urine: 1.028 (ref 1.005–1.030)
UROBILINOGEN UA: 2 mg/dL — AB (ref 0.0–1.0)
pH: 6.5 (ref 5.0–8.0)

## 2014-08-27 LAB — CBC WITH DIFFERENTIAL/PLATELET
Basophils Absolute: 0 10*3/uL (ref 0.0–0.1)
Basophils Relative: 0 % (ref 0–1)
EOS ABS: 0 10*3/uL (ref 0.0–0.7)
Eosinophils Relative: 0 % (ref 0–5)
HEMATOCRIT: 28.4 % — AB (ref 36.0–46.0)
Hemoglobin: 8.9 g/dL — ABNORMAL LOW (ref 12.0–15.0)
LYMPHS ABS: 0.5 10*3/uL — AB (ref 0.7–4.0)
LYMPHS PCT: 8 % — AB (ref 12–46)
MCH: 24.3 pg — AB (ref 26.0–34.0)
MCHC: 31.3 g/dL (ref 30.0–36.0)
MCV: 77.6 fL — AB (ref 78.0–100.0)
MONO ABS: 0.3 10*3/uL (ref 0.1–1.0)
Monocytes Relative: 6 % (ref 3–12)
Neutro Abs: 4.7 10*3/uL (ref 1.7–7.7)
Neutrophils Relative %: 86 % — ABNORMAL HIGH (ref 43–77)
PLATELETS: 644 10*3/uL — AB (ref 150–400)
RBC: 3.66 MIL/uL — ABNORMAL LOW (ref 3.87–5.11)
RDW: 16 % — AB (ref 11.5–15.5)
WBC: 5.5 10*3/uL (ref 4.0–10.5)

## 2014-08-27 LAB — PREGNANCY, URINE: Preg Test, Ur: NEGATIVE

## 2014-08-27 MED ORDER — FENTANYL CITRATE 0.05 MG/ML IJ SOLN
100.0000 ug | Freq: Once | INTRAMUSCULAR | Status: AC
  Administered 2014-08-27: 100 ug via INTRAVENOUS
  Filled 2014-08-27: qty 2

## 2014-08-27 MED ORDER — SODIUM CHLORIDE 0.9 % IV BOLUS (SEPSIS)
1000.0000 mL | Freq: Once | INTRAVENOUS | Status: AC
Start: 2014-08-27 — End: 2014-08-28
  Administered 2014-08-27: 1000 mL via INTRAVENOUS

## 2014-08-27 MED ORDER — ONDANSETRON 4 MG PO TBDP
4.0000 mg | ORAL_TABLET | Freq: Once | ORAL | Status: AC
  Administered 2014-08-27: 4 mg via ORAL
  Filled 2014-08-27: qty 1

## 2014-08-27 MED ORDER — METHYLPREDNISOLONE SODIUM SUCC 125 MG IJ SOLR
125.0000 mg | Freq: Once | INTRAMUSCULAR | Status: AC
  Administered 2014-08-27: 125 mg via INTRAVENOUS
  Filled 2014-08-27: qty 2

## 2014-08-27 NOTE — ED Provider Notes (Addendum)
CSN: [id number]     Arrival date & time 08/27/14  1826 History  This chart was scribed for Beverly Fines, MD by Cathie Hoops, ED Scribe. The patient was seen in [facility]. The patient's care was started at 11:05 PM.     Chief Complaint  Patient presents with  . Abdominal Pain    The history is provided by the patient. No language interpreter was used.   HPI Comments: Beverly Richards is a 23 y.o. female with past medical history of Crohn's disease who presents to the Emergency Department complaining of abdominal pain onset three days ago. Pt localizes her pain to her lower abdomen and rates it as 7.5/10. Pt notes her prednisone was recently increased to prednisone 20 mg bid without improvement. Pt has associated nausea, vomiting and diarrhea. Pt states she hasn't been able to hold anything down. Pt has been on prednisone in the past and done well. Pt states her symptoms are similar to previous Crohn's flare-ups. She was given Zofran on arrival with improvement in her nausea.   Past Medical History  Diagnosis Date  . Ovarian cyst   . Anemia   . Monocytosis 10/21/2013  . Crohn's disease    Past Surgical History  Procedure Laterality Date  . No past surgeries    . Colonoscopy N/A 10/23/2013    Procedure: COLONOSCOPY;  Surgeon: Missy Sabins, MD;  Location: [facility];  Service: Endoscopy;  Laterality: N/A;   Family History  Problem Relation Age of Onset  . Diabetes Mother   . Hyperlipidemia Mother   . Diabetes Maternal Grandmother   . Hyperlipidemia Maternal Grandmother   . Birth defects Paternal Grandmother     Breast Cancer   History  Substance Use Topics  . Smoking status: Never Smoker   . Smokeless tobacco: Never Used  . Alcohol Use: No   OB History    No data available     Review of Systems  Constitutional: Negative for fever and chills.  Gastrointestinal: Positive for nausea, vomiting and abdominal pain.  All other systems reviewed and are negative.  Allergies   Benadryl and Penicillins  Home Medications   Prior to Admission medications   Medication Sig Start Date End Date Taking? Authorizing Provider  ferrous sulfate 325 (65 FE) MG tablet Take 1 tablet (325 mg total) by mouth 2 (two) times daily with a meal. 10/24/13   Orson Eva, MD  HYDROcodone-acetaminophen (NORCO/VICODIN) 5-325 MG per tablet Take 1-2 tablets every 6 hours as needed for severe pain 07/27/14   Brunetta Jeans, PA-C  Multiple Vitamin (MULTIVITAMIN WITH MINERALS) TABS tablet Take 1 tablet by mouth every morning.    Historical Provider, MD  ondansetron (ZOFRAN) 4 MG tablet Take 1 tablet (4 mg total) by mouth every 6 (six) hours. 05/12/14   Glendell Docker, NP  predniSONE (DELTASONE) 10 MG tablet Take 3 tablets (30 mg total) by mouth daily with breakfast. 08/24/14   Brunetta Jeans, PA-C  predniSONE (DELTASONE) 20 MG tablet Take 2 tablets by mouth daily for 2 weeks.  Once finished, begin 10 mg prescription as directed. 08/24/14   Brunetta Jeans, PA-C   Triage Vitals: BP 137/88 mmHg  Pulse 53  Temp(Src) 97.9 F (36.6 C) (Oral)  Resp 16  Ht 5\' 2"  (1.575 m)  Wt 120 lb (54.432 kg)  BMI 21.94 kg/m2  SpO2 100%  LMP 08/19/2014  Physical Exam  Nursing note and vitals reviewed. General: Well-developed, well-nourished female in no acute distress; appearance  consistent with age of record HENT: normocephalic; atraumatic Eyes: pupils equal, round and reactive to light; extraocular muscles intact Neck: supple Heart: regular rate and rhythm Lungs: clear to auscultation bilaterally Abdomen: soft; nondistended; no masses or hepatosplenomegaly; bowel sounds present; LLQ tenderness. Extremities: No deformity; full range of motion; pulses normal Neurologic: Awake, alert and oriented; motor function intact in all extremities and symmetric; no facial droop Skin: Warm and dry Psychiatric: Normal mood and affect   ED Course  Procedures (including critical care time) DIAGNOSTIC  STUDIES: Oxygen Saturation is 100% on RA, normal by my interpretation.    COORDINATION OF CARE: 11:11 PM- Patient informed of current plan for treatment and evaluation and agrees with plan at this time.     MDM   Nursing notes and vitals signs, including pulse oximetry, reviewed.  Summary of this visit's results, reviewed by myself:  Labs:  Results for orders placed or performed during the hospital encounter of 08/27/14 (from the past 24 hour(s))  Urinalysis, Routine w reflex microscopic     Status: Abnormal   Collection Time: 08/27/14  6:35 PM  Result Value Ref Range   Color, Urine YELLOW YELLOW   APPearance CLOUDY (A) CLEAR   Specific Gravity, Urine 1.028 1.005 - 1.030   pH 6.5 5.0 - 8.0   Glucose, UA NEGATIVE NEGATIVE mg/dL   Hgb urine dipstick NEGATIVE NEGATIVE   Bilirubin Urine NEGATIVE NEGATIVE   Ketones, ur 15 (A) NEGATIVE mg/dL   Protein, ur NEGATIVE NEGATIVE mg/dL   Urobilinogen, UA 2.0 (H) 0.0 - 1.0 mg/dL   Nitrite NEGATIVE NEGATIVE   Leukocytes, UA NEGATIVE NEGATIVE  Pregnancy, urine     Status: None   Collection Time: 08/27/14  6:35 PM  Result Value Ref Range   Preg Test, Ur NEGATIVE NEGATIVE  CBC with Differential     Status: Abnormal   Collection Time: 08/27/14 10:30 PM  Result Value Ref Range   WBC 5.5 4.0 - 10.5 K/uL   RBC 3.66 (L) 3.87 - 5.11 MIL/uL   Hemoglobin 8.9 (L) 12.0 - 15.0 g/dL   HCT 28.4 (L) 36.0 - 46.0 %   MCV 77.6 (L) 78.0 - 100.0 fL   MCH 24.3 (L) 26.0 - 34.0 pg   MCHC 31.3 30.0 - 36.0 g/dL   RDW 16.0 (H) 11.5 - 15.5 %   Platelets 644 (H) 150 - 400 K/uL   Neutrophils Relative % 86 (H) 43 - 77 %   Neutro Abs 4.7 1.7 - 7.7 K/uL   Lymphocytes Relative 8 (L) 12 - 46 %   Lymphs Abs 0.5 (L) 0.7 - 4.0 K/uL   Monocytes Relative 6 3 - 12 %   Monocytes Absolute 0.3 0.1 - 1.0 K/uL   Eosinophils Relative 0 0 - 5 %   Eosinophils Absolute 0.0 0.0 - 0.7 K/uL   Basophils Relative 0 0 - 1 %   Basophils Absolute 0.0 0.0 - 0.1 K/uL  Comprehensive  metabolic panel     Status: Abnormal   Collection Time: 08/27/14 10:30 PM  Result Value Ref Range   Sodium 142 137 - 147 mEq/L   Potassium 3.8 3.7 - 5.3 mEq/L   Chloride 103 96 - 112 mEq/L   CO2 24 19 - 32 mEq/L   Glucose, Bld 112 (H) 70 - 99 mg/dL   BUN 9 6 - 23 mg/dL   Creatinine, Ser 0.60 0.50 - 1.10 mg/dL   Calcium 9.2 8.4 - 10.5 mg/dL   Total Protein 7.8 6.0 - 8.3  g/dL   Albumin 3.0 (L) 3.5 - 5.2 g/dL   AST 11 0 - 37 U/L   ALT 10 0 - 35 U/L   Alkaline Phosphatase 80 39 - 117 U/L   Total Bilirubin 0.3 0.3 - 1.2 mg/dL   GFR calc non Af Amer >90 >90 mL/min   GFR calc Af Amer >90 >90 mL/min   Anion gap 15 5 - 15   12:50 AM Patient advised of hemoglobin level. Patient is already on iron for known anemia. She was advised to contact her physician on the next business day. Pain and nausea are controlled this time. She was given an IV dose of Solu-Medrol as well.   I personally performed the services described in this documentation, which was scribed in my presence. The recorded information has been reviewed and is accurate.   Beverly Fines, MD 08/28/14 [id number]  Beverly Fines, MD 08/28/14 [phone]

## 2014-08-27 NOTE — ED Notes (Signed)
MD at bedside. 

## 2014-08-27 NOTE — ED Notes (Signed)
C/o abd pain, n/v/d x 3 days

## 2014-08-28 MED ORDER — HYDROCODONE-ACETAMINOPHEN 5-325 MG PO TABS: 1.0000 | ORAL_TABLET | Freq: Four times a day (QID) | ORAL | Status: DC | PRN

## 2014-08-28 NOTE — Discharge Instructions (Signed)
Crohn Disease  Crohn disease is a long-term (chronic) soreness and redness (inflammation) of the intestines (bowel). It can affect any portion of the digestive tract, from the mouth to the anus. It can also cause problems outside the digestive tract. Crohn disease is closely related to a disease called ulcerative colitis (together, these two diseases are called inflammatory bowel disease).   CAUSES   The cause of Crohn disease is not known. One theory is that, in an easily affected person, the immune system is triggered to attack the body's own digestive tissue. Crohn disease runs in families. It seems to be more common in certain geographic areas and amongst certain races. There are no clear-cut dietary causes.   SYMPTOMS   Crohn disease can cause many different symptoms since it can affect many different parts of the body. Symptoms include:  · Fatigue.  · Weight loss.  · Chronic diarrhea, sometime bloody.  · Abdominal pain and cramps.  · Fever.  · Ulcers or canker sores in the mouth or rectum.  · Anemia (low red blood cells).  · Arthritis, skin problems, and eye problems may occur.  Complications of Crohn disease can include:  · Series of holes (perforation) of the bowel.  · Portions of the intestines sticking to each other (adhesions).  · Obstruction of the bowel.  · Fistula formation, typically in the rectal area but also in other areas. A fistula is an opening between the bowels and the outside, or between the bowels and another organ.  · A painful crack in the mucous membrane of the anus (rectal fissure).  DIAGNOSIS   Your caregiver may suspect Crohn disease based on your symptoms and an exam. Blood tests may confirm that there is a problem. You may be asked to submit a stool specimen for examination. X-rays and CT scans may be necessary. Ultimately, the diagnosis is usually made after a procedure that uses a flexible tube that is inserted via your mouth or your anus. This is done under sedation and is called  either an upper endoscopy or colonoscopy. With these tests, the specialist can take tiny tissue samples and remove them from the inside of the bowel (biopsy). Examination of this biopsy tissue under a microscope can reveal Crohn disease as the cause of your symptoms.  Due to the many different forms that Crohn disease can take, symptoms may be present for several years before a diagnosis is made.  TREATMENT   Medications are often used to decrease inflammation and control the immune system. These include medicines related to aspirin, steroid medications, and newer and stronger medications to slow down the immune system. Some medications may be used as suppositories or enemas. A number of other medications are used or have been studied. Your caregiver will make specific recommendations.  HOME CARE INSTRUCTIONS   · Symptoms such as diarrhea can be controlled with medications. Avoid foods that have a laxative effect such as fresh fruit, vegetables, and dairy products. During flare-ups, you can rest your bowel by refraining from solid foods. Drink clear liquids frequently during the day. (Electrolyte or rehydrating fluids are best. Your caregiver can help you with suggestions.) Drink often to prevent loss of body fluids (dehydration). When diarrhea has cleared, eat small meals and more frequently. Avoid food additives and stimulants such as caffeine (coffee, tea, or chocolate). Enzyme supplements may help if you develop intolerance to a sugar in dairy products (lactose). Ask your caregiver or dietitian about specific dietary instructions.  · Try to   maintain a positive attitude. Learn relaxation techniques such as self-hypnosis, mental imaging, and muscle relaxation.  · If possible, avoid stresses which can aggravate your condition.  · Exercise regularly.  · Follow your diet.  · Always get plenty of rest.  SEEK MEDICAL CARE IF:   · Your symptoms fail to improve after a week or two of new treatment.  · You experience  continued weight loss.  · You have ongoing cramps or loose bowels.  · You develop a new skin rash, skin sores, or eye problems.  SEEK IMMEDIATE MEDICAL CARE IF:   · You have worsening of your symptoms or develop new symptoms.  · You have a fever.  · You develop bloody diarrhea.  · You develop severe abdominal pain.  MAKE SURE YOU:   · Understand these instructions.  · Will watch your condition.  · Will get help right away if you are not doing well or get worse.  Document Released: 06/27/2005 Document Revised: 02/01/2014 Document Reviewed: 05/26/2007  [employer]® Patient Information ©2015 [employer]. This information is not intended to replace advice given to you by your health care provider. Make sure you discuss any questions you have with your health care provider.

## 2014-09-14 ENCOUNTER — Encounter: Payer: Self-pay | Admitting: Physician Assistant

## 2014-09-14 ENCOUNTER — Ambulatory Visit (INDEPENDENT_AMBULATORY_CARE_PROVIDER_SITE_OTHER): Payer: 59 | Admitting: Physician Assistant

## 2014-09-14 VITALS — BP 131/90 | HR 93 | Temp 98.7°F | Resp 14 | Ht 62.25 in | Wt 121.2 lb

## 2014-09-14 DIAGNOSIS — K50911 Crohn's disease, unspecified, with rectal bleeding: Secondary | ICD-10-CM

## 2014-09-14 DIAGNOSIS — D509 Iron deficiency anemia, unspecified: Secondary | ICD-10-CM | POA: Diagnosis not present

## 2014-09-14 LAB — CBC
HCT: 30.4 % — ABNORMAL LOW (ref 36.0–46.0)
Hemoglobin: 9.6 g/dL — ABNORMAL LOW (ref 12.0–15.0)
MCHC: 31.4 g/dL (ref 30.0–36.0)
MCV: 77.9 fl — ABNORMAL LOW (ref 78.0–100.0)
PLATELETS: 460 10*3/uL — AB (ref 150.0–400.0)
RBC: 3.9 Mil/uL (ref 3.87–5.11)
RDW: 18.4 % — ABNORMAL HIGH (ref 11.5–15.5)
WBC: 5 10*3/uL (ref 4.0–10.5)

## 2014-09-14 NOTE — Patient Instructions (Signed)
Please stop by the lab for blood work.  I will call you with your results.   Please continue the steroid medication.  Continue pain medications as directed.  Giving your recent worsening anemia, it is very important that a specialist sees you soon. I have gotten an appointment for you earlier with Alonza Bogus, PA-C with [facility].  The appointment is for Thursday, December 17th at 9:30 AM. Please show up at 9:15. It is very important you go to this appointment.  If you develop any shortness of breath, racing heart, please go to the ER.  Crohn Disease Crohn disease is a long-term (chronic) soreness and redness (inflammation) of the intestines (bowel). It can affect any portion of the digestive tract, from the mouth to the anus. It can also cause problems outside the digestive tract. Crohn disease is closely related to a disease called ulcerative colitis (together, these two diseases are called inflammatory bowel disease).  CAUSES  The cause of Crohn disease is not known. One Link Snuffer is that, in an easily affected person, the immune system is triggered to attack the body's own digestive tissue. Crohn disease runs in families. It seems to be more common in certain geographic areas and amongst certain races. There are no clear-cut dietary causes.  SYMPTOMS  Crohn disease can cause many different symptoms since it can affect many different parts of the body. Symptoms include:  Fatigue.  Weight loss.  Chronic diarrhea, sometime bloody.  Abdominal pain and cramps.  Fever.  Ulcers or canker sores in the mouth or rectum.  Anemia (low red blood cells).  Arthritis, skin problems, and eye problems may occur. Complications of Crohn disease can include:  Series of holes (perforation) of the bowel.  Portions of the intestines sticking to each other (adhesions).  Obstruction of the bowel.  Fistula formation, typically in the rectal area but also in other areas. A fistula is an  opening between the bowels and the outside, or between the bowels and another organ.  A painful crack in the mucous membrane of the anus (rectal fissure). DIAGNOSIS  Your caregiver may suspect Crohn disease based on your symptoms and an exam. Blood tests may confirm that there is a problem. You may be asked to submit a stool specimen for examination. X-rays and CT scans may be necessary. Ultimately, the diagnosis is usually made after a procedure that uses a flexible tube that is inserted via your mouth or your anus. This is done under sedation and is called either an upper endoscopy or colonoscopy. With these tests, the specialist can take tiny tissue samples and remove them from the inside of the bowel (biopsy). Examination of this biopsy tissue under a microscope can reveal Crohn disease as the cause of your symptoms. Due to the many different forms that Crohn disease can take, symptoms may be present for several years before a diagnosis is made. TREATMENT  Medications are often used to decrease inflammation and control the immune system. These include medicines related to aspirin, steroid medications, and newer and stronger medications to slow down the immune system. Some medications may be used as suppositories or enemas. A number of other medications are used or have been studied. Your caregiver will make specific recommendations. HOME CARE INSTRUCTIONS   Symptoms such as diarrhea can be controlled with medications. Avoid foods that have a laxative effect such as fresh fruit, vegetables, and dairy products. During flare-ups, you can rest your bowel by refraining from solid foods. Drink clear liquids frequently  during the day. (Electrolyte or rehydrating fluids are best. Your caregiver can help you with suggestions.) Drink often to prevent loss of body fluids (dehydration). When diarrhea has cleared, eat small meals and more frequently. Avoid food additives and stimulants such as caffeine (coffee,  tea, or chocolate). Enzyme supplements may help if you develop intolerance to a sugar in dairy products (lactose). Ask your caregiver or dietitian about specific dietary instructions.  Try to maintain a positive attitude. Learn relaxation techniques such as self-hypnosis, mental imaging, and muscle relaxation.  If possible, avoid stresses which can aggravate your condition.  Exercise regularly.  Follow your diet.  Always get plenty of rest. SEEK MEDICAL CARE IF:   Your symptoms fail to improve after a week or two of new treatment.  You experience continued weight loss.  You have ongoing cramps or loose bowels.  You develop a new skin rash, skin sores, or eye problems. SEEK IMMEDIATE MEDICAL CARE IF:   You have worsening of your symptoms or develop new symptoms.  You have a fever.  You develop bloody diarrhea.  You develop severe abdominal pain. MAKE SURE YOU:   Understand these instructions.  Will watch your condition.  Will get help right away if you are not doing well or get worse. Document Released: 06/27/2005 Document Revised: 02/01/2014 Document Reviewed: 05/26/2007 [facility] Patient Information 2015 [place], [place]. This information is not intended to replace advice given to you by your health care provider. Make sure you discuss any questions you have with your health care provider.

## 2014-09-14 NOTE — Assessment & Plan Note (Signed)
Will recheck CBC today.  Will also check serum iron level.  Will alter regimen based on results.

## 2014-09-14 NOTE — Assessment & Plan Note (Signed)
Patient instructed to complete steroid taper as directed.  Will check STAT CBC today to reassess hgb.  History of slow chronic rectal bleeding.  Asymptomatic per patient.  However Hgb is declining.  Continue iron supplementation.  Continue pain medications PRN. Spoke with [facility] to expedite her new patient appointment.  Will now be seeing Alonza Bogus, PA-C on 09/16/14 at 9:30 AM.  Patient highly urged to attend this appointment.

## 2014-09-14 NOTE — Progress Notes (Signed)
Pre visit review using our clinic review tool, if applicable. No additional management support is needed unless otherwise documented below in the visit note/SLS  

## 2014-09-14 NOTE — Progress Notes (Signed)
Patient presents to clinic today for follow-up of Crohn colitis and iron deficiency anemia.  Patient was placed on a Prednisone taper at last visit.  Has been taking as directed. Is not on the 10 mg tablets daily.  Endorses at first there was no improvement in symptoms.  Now having some relief.  Was recently in the ER for exacerbation of symptoms where IV Solumedrol given.  CBC obtained at that time and revealed a decline in hemoglobin. Patient has since restarted her iron supplementation taking 325 mg BID.  Denies lightheadedness, dizziness, SOB or palpitations.  Still has not seen GI.  Appointment scheduled for early January.  Past Medical History  Diagnosis Date  . Ovarian cyst   . Anemia   . Monocytosis 10/21/2013  . Crohn's disease     Current Outpatient Prescriptions on File Prior to Visit  Medication Sig Dispense Refill  . ferrous sulfate 325 (65 FE) MG tablet Take 1 tablet (325 mg total) by mouth 2 (two) times daily with a meal. 60 tablet 3  . HYDROcodone-acetaminophen (NORCO/VICODIN) 5-325 MG per tablet Take 1-2 tablets every 6 hours as needed for severe pain 60 tablet 0  . HYDROcodone-acetaminophen (NORCO/VICODIN) 5-325 MG per tablet Take 1-2 tablets by mouth every 6 (six) hours as needed (for pain). 20 tablet 0  . Multiple Vitamin (MULTIVITAMIN WITH MINERALS) TABS tablet Take 1 tablet by mouth every morning.    . ondansetron (ZOFRAN) 4 MG tablet Take 1 tablet (4 mg total) by mouth every 6 (six) hours. 12 tablet 0  . predniSONE (DELTASONE) 10 MG tablet Take 3 tablets (30 mg total) by mouth daily with breakfast. 21 tablet 0   No current facility-administered medications on file prior to visit.    Allergies  Allergen Reactions  . Benadryl [Diphenhydramine Hcl] Hives  . Penicillins Hives    Family History  Problem Relation Age of Onset  . Diabetes Mother   . Hyperlipidemia Mother   . Diabetes Maternal Grandmother   . Hyperlipidemia Maternal Grandmother   . Birth defects  Paternal Grandmother     Breast Cancer    History   Social History  . Marital Status: Single    Spouse Name: N/A    Number of Children: N/A  . Years of Education: N/A   Social History Main Topics  . Smoking status: Never Smoker   . Smokeless tobacco: Never Used  . Alcohol Use: No  . Drug Use: No  . Sexual Activity: None   Other Topics Concern  . None   Social History Narrative    Review of Systems - See HPI.  All other ROS are negative.  BP 131/90 mmHg  Pulse 93  Temp(Src) 98.7 F (37.1 C) (Oral)  Resp 14  Ht 5' 2.25" (1.581 m)  Wt 121 lb 4 oz (54.999 kg)  BMI 22.00 kg/m2  SpO2 100%  LMP 08/19/2014  Physical Exam  Constitutional: She is oriented to person, place, and time and well-developed, well-nourished, and in no distress.  HENT:  Head: Normocephalic and atraumatic.  Neck: Neck supple.  Cardiovascular: Normal rate, regular rhythm, normal heart sounds and intact distal pulses.   Pulmonary/Chest: Effort normal and breath sounds normal. No respiratory distress. She has no wheezes. She has no rales. She exhibits no tenderness.  Abdominal: Soft. Bowel sounds are normal. She exhibits no distension. There is no tenderness.  Neurological: She is alert and oriented to person, place, and time.  Skin: Skin is warm and dry. No rash noted.  Psychiatric: Affect normal.  Vitals reviewed.   Recent Results (from the past 2160 hour(s))  Urinalysis, Routine w reflex microscopic     Status: Abnormal   Collection Time: 07/12/14  4:50 PM  Result Value Ref Range   Color, Urine AMBER (A) YELLOW    Comment: BIOCHEMICALS MAY BE AFFECTED BY COLOR   APPearance CLOUDY (A) CLEAR   Specific Gravity, Urine 1.024 1.005 - 1.030   pH 6.0 5.0 - 8.0   Glucose, UA NEGATIVE NEGATIVE mg/dL   Hgb urine dipstick SMALL (A) NEGATIVE   Bilirubin Urine SMALL (A) NEGATIVE   Ketones, ur 40 (A) NEGATIVE mg/dL   Protein, ur NEGATIVE NEGATIVE mg/dL   Urobilinogen, UA 2.0 (H) 0.0 - 1.0 mg/dL    Nitrite NEGATIVE NEGATIVE   Leukocytes, UA MODERATE (A) NEGATIVE  Pregnancy, urine     Status: None   Collection Time: 07/12/14  4:50 PM  Result Value Ref Range   Preg Test, Ur NEGATIVE NEGATIVE    Comment:        THE SENSITIVITY OF THIS METHODOLOGY IS >20 mIU/mL.  Urine microscopic-add on     Status: Abnormal   Collection Time: 07/12/14  4:50 PM  Result Value Ref Range   Squamous Epithelial / LPF FEW (A) RARE   WBC, UA 11-20 <3 WBC/hpf   RBC / HPF 11-20 <3 RBC/hpf   Bacteria, UA FEW (A) RARE   Urine-Other MUCOUS PRESENT   Urine culture     Status: None   Collection Time: 07/12/14  4:50 PM  Result Value Ref Range   Specimen Description URINE, RANDOM    Special Requests NONE    Culture  Setup Time      07/13/2014 09:21 Performed at [place] Performed at [facility]    Culture NO GROWTH Performed at [facility]    Report Status 07/14/2014 FINAL   CBC with Differential     Status: Abnormal   Collection Time: 07/12/14  6:50 PM  Result Value Ref Range   WBC 4.7 4.0 - 10.5 K/uL   RBC 4.06 3.87 - 5.11 MIL/uL   Hemoglobin 9.8 (L) 12.0 - 15.0 g/dL   HCT 30.7 (L) 36.0 - 46.0 %   MCV 75.6 (L) 78.0 - 100.0 fL   MCH 24.1 (L) 26.0 - 34.0 pg   MCHC 31.9 30.0 - 36.0 g/dL   RDW 14.9 11.5 - 15.5 %   Platelets 641 (H) 150 - 400 K/uL   Neutrophils Relative % 91 (H) 43 - 77 %   Neutro Abs 4.3 1.7 - 7.7 K/uL   Lymphocytes Relative 5 (L) 12 - 46 %   Lymphs Abs 0.2 (L) 0.7 - 4.0 K/uL   Monocytes Relative 4 3 - 12 %   Monocytes Absolute 0.2 0.1 - 1.0 K/uL   Eosinophils Relative 0 0 - 5 %   Eosinophils Absolute 0.0 0.0 - 0.7 K/uL   Basophils Relative 0 0 - 1 %   Basophils Absolute 0.0 0.0 - 0.1 K/uL  Comprehensive metabolic panel     Status: Abnormal   Collection Time: 07/12/14  6:50 PM  Result Value Ref Range   Sodium 138 137 - 147 mEq/L   Potassium 3.4 (L) 3.7 - 5.3 mEq/L   Chloride 99 96 - 112 mEq/L   CO2 24 19 - 32 mEq/L    Glucose, Bld 143 (H) 70 - 99 mg/dL   BUN 7 6 - 23 mg/dL  Creatinine, Ser 0.60 0.50 - 1.10 mg/dL   Calcium 9.5 8.4 - 10.5 mg/dL   Total Protein 8.1 6.0 - 8.3 g/dL   Albumin 2.8 (L) 3.5 - 5.2 g/dL   AST 9 0 - 37 U/L   ALT 7 0 - 35 U/L   Alkaline Phosphatase 82 39 - 117 U/L   Total Bilirubin 0.4 0.3 - 1.2 mg/dL   GFR calc non Af Amer >90 >90 mL/min   GFR calc Af Amer >90 >90 mL/min    Comment: (NOTE) The eGFR has been calculated using the CKD EPI equation. This calculation has not been validated in all clinical situations. eGFR's persistently <90 mL/min signify possible Chronic Kidney Disease.   Anion gap 15 5 - 15  Urinalysis, Routine w reflex microscopic     Status: Abnormal   Collection Time: 08/27/14  6:35 PM  Result Value Ref Range   Color, Urine YELLOW YELLOW   APPearance CLOUDY (A) CLEAR   Specific Gravity, Urine 1.028 1.005 - 1.030   pH 6.5 5.0 - 8.0   Glucose, UA NEGATIVE NEGATIVE mg/dL   Hgb urine dipstick NEGATIVE NEGATIVE   Bilirubin Urine NEGATIVE NEGATIVE   Ketones, ur 15 (A) NEGATIVE mg/dL   Protein, ur NEGATIVE NEGATIVE mg/dL   Urobilinogen, UA 2.0 (H) 0.0 - 1.0 mg/dL   Nitrite NEGATIVE NEGATIVE   Leukocytes, UA NEGATIVE NEGATIVE    Comment: MICROSCOPIC NOT DONE ON URINES WITH NEGATIVE PROTEIN, BLOOD, LEUKOCYTES, NITRITE, OR GLUCOSE <1000 mg/dL.  Pregnancy, urine     Status: None   Collection Time: 08/27/14  6:35 PM  Result Value Ref Range   Preg Test, Ur NEGATIVE NEGATIVE    Comment:        THE SENSITIVITY OF THIS METHODOLOGY IS >20 mIU/mL.   CBC with Differential     Status: Abnormal   Collection Time: 08/27/14 10:30 PM  Result Value Ref Range   WBC 5.5 4.0 - 10.5 K/uL   RBC 3.66 (L) 3.87 - 5.11 MIL/uL   Hemoglobin 8.9 (L) 12.0 - 15.0 g/dL   HCT 28.4 (L) 36.0 - 46.0 %   MCV 77.6 (L) 78.0 - 100.0 fL   MCH 24.3 (L) 26.0 - 34.0 pg   MCHC 31.3 30.0 - 36.0 g/dL   RDW 16.0 (H) 11.5 - 15.5 %   Platelets 644 (H) 150 - 400 K/uL   Neutrophils Relative % 86  (H) 43 - 77 %   Neutro Abs 4.7 1.7 - 7.7 K/uL   Lymphocytes Relative 8 (L) 12 - 46 %   Lymphs Abs 0.5 (L) 0.7 - 4.0 K/uL   Monocytes Relative 6 3 - 12 %   Monocytes Absolute 0.3 0.1 - 1.0 K/uL   Eosinophils Relative 0 0 - 5 %   Eosinophils Absolute 0.0 0.0 - 0.7 K/uL   Basophils Relative 0 0 - 1 %   Basophils Absolute 0.0 0.0 - 0.1 K/uL  Comprehensive metabolic panel     Status: Abnormal   Collection Time: 08/27/14 10:30 PM  Result Value Ref Range   Sodium 142 137 - 147 mEq/L   Potassium 3.8 3.7 - 5.3 mEq/L   Chloride 103 96 - 112 mEq/L   CO2 24 19 - 32 mEq/L   Glucose, Bld 112 (H) 70 - 99 mg/dL   BUN 9 6 - 23 mg/dL   Creatinine, Ser 0.60 0.50 - 1.10 mg/dL   Calcium 9.2 8.4 - 10.5 mg/dL   Total Protein 7.8 6.0 - 8.3 g/dL  Albumin 3.0 (L) 3.5 - 5.2 g/dL   AST 11 0 - 37 U/L   ALT 10 0 - 35 U/L   Alkaline Phosphatase 80 39 - 117 U/L   Total Bilirubin 0.3 0.3 - 1.2 mg/dL   GFR calc non Af Amer >90 >90 mL/min   GFR calc Af Amer >90 >90 mL/min    Comment: (NOTE) The eGFR has been calculated using the CKD EPI equation. This calculation has not been validated in all clinical situations. eGFR's persistently <90 mL/min signify possible Chronic Kidney Disease.    Anion gap 15 5 - 15    Assessment/Plan: Crohn disease Patient instructed to complete steroid taper as directed.  Will check STAT CBC today to reassess hgb.  History of slow chronic rectal bleeding.  Asymptomatic per patient.  However Hgb is declining.  Continue iron supplementation.  Continue pain medications PRN. Spoke with [facility] to expedite her new patient appointment.  Will now be seeing Alonza Bogus, PA-C on 09/16/14 at 9:30 AM.  Patient highly urged to attend this appointment.  Anemia, iron deficiency Will recheck CBC today.  Will also check serum iron level.  Will alter regimen based on results.

## 2014-09-15 LAB — IBC PANEL
IRON: 16 ug/dL — AB (ref 42–145)
SATURATION RATIOS: 4.3 % — AB (ref 20.0–50.0)
TRANSFERRIN: 264.3 mg/dL (ref 212.0–360.0)

## 2014-09-15 LAB — IRON: IRON: 16 ug/dL — AB (ref 42–145)

## 2014-09-16 ENCOUNTER — Ambulatory Visit: Payer: 59 | Admitting: Gastroenterology

## 2014-10-04 ENCOUNTER — Ambulatory Visit: Payer: 59 | Admitting: Gastroenterology

## 2015-02-04 ENCOUNTER — Emergency Department (HOSPITAL_BASED_OUTPATIENT_CLINIC_OR_DEPARTMENT_OTHER): Payer: 59

## 2015-02-04 ENCOUNTER — Encounter (HOSPITAL_BASED_OUTPATIENT_CLINIC_OR_DEPARTMENT_OTHER): Payer: Self-pay

## 2015-02-04 ENCOUNTER — Emergency Department (HOSPITAL_BASED_OUTPATIENT_CLINIC_OR_DEPARTMENT_OTHER)
Admission: EM | Admit: 2015-02-04 | Discharge: 2015-02-04 | Disposition: A | Discharge status: Home / Self Care | Payer: 59 | Attending: Emergency Medicine | Admitting: Emergency Medicine

## 2015-02-04 DIAGNOSIS — R590 Localized enlarged lymph nodes: Secondary | ICD-10-CM | POA: Diagnosis not present

## 2015-02-04 DIAGNOSIS — Z8719 Personal history of other diseases of the digestive system: Secondary | ICD-10-CM | POA: Diagnosis not present

## 2015-02-04 DIAGNOSIS — Z3202 Encounter for pregnancy test, result negative: Secondary | ICD-10-CM | POA: Insufficient documentation

## 2015-02-04 DIAGNOSIS — E876 Hypokalemia: Secondary | ICD-10-CM | POA: Insufficient documentation

## 2015-02-04 DIAGNOSIS — Z880 Allergy status to penicillin: Secondary | ICD-10-CM | POA: Diagnosis not present

## 2015-02-04 DIAGNOSIS — Z8742 Personal history of other diseases of the female genital tract: Secondary | ICD-10-CM | POA: Diagnosis not present

## 2015-02-04 DIAGNOSIS — Z7952 Long term (current) use of systemic steroids: Secondary | ICD-10-CM | POA: Insufficient documentation

## 2015-02-04 DIAGNOSIS — B349 Viral infection, unspecified: Secondary | ICD-10-CM | POA: Insufficient documentation

## 2015-02-04 DIAGNOSIS — Z79899 Other long term (current) drug therapy: Secondary | ICD-10-CM | POA: Insufficient documentation

## 2015-02-04 DIAGNOSIS — D649 Anemia, unspecified: Secondary | ICD-10-CM | POA: Insufficient documentation

## 2015-02-04 DIAGNOSIS — R0981 Nasal congestion: Secondary | ICD-10-CM | POA: Diagnosis present

## 2015-02-04 LAB — BASIC METABOLIC PANEL
ANION GAP: 10 (ref 5–15)
BUN: 8 mg/dL (ref 6–20)
CHLORIDE: 104 mmol/L (ref 101–111)
CO2: 21 mmol/L — ABNORMAL LOW (ref 22–32)
Calcium: 8.3 mg/dL — ABNORMAL LOW (ref 8.9–10.3)
Creatinine, Ser: 0.73 mg/dL (ref 0.44–1.00)
GFR calc non Af Amer: >60 mL/min (ref >60)
GLUCOSE: 96 mg/dL (ref 70–99)
POTASSIUM: 3 mmol/L — AB (ref 3.5–5.1)
Sodium: 135 mmol/L (ref 135–145)

## 2015-02-04 LAB — CBC
HEMATOCRIT: 27.9 % — AB (ref 36.0–46.0)
Hemoglobin: 8.8 g/dL — ABNORMAL LOW (ref 12.0–15.0)
MCH: 23.2 pg — ABNORMAL LOW (ref 26.0–34.0)
MCHC: 31.5 g/dL (ref 30.0–36.0)
MCV: 73.4 fL — AB (ref 78.0–100.0)
PLATELETS: 441 10*3/uL — AB (ref 150–400)
RBC: 3.8 MIL/uL — ABNORMAL LOW (ref 3.87–5.11)
RDW: 16.1 % — AB (ref 11.5–15.5)
WBC: 6.5 10*3/uL (ref 4.0–10.5)

## 2015-02-04 LAB — PREGNANCY, URINE: PREG TEST UR: NEGATIVE

## 2015-02-04 IMAGING — DX DG CHEST 2V
2 series · 2 of 2 positions shown · non-contrast
Comparison: [DATE]

CLINICAL DATA: Cough, congestion

EXAM:
CHEST  2 VIEW

[chest pa]
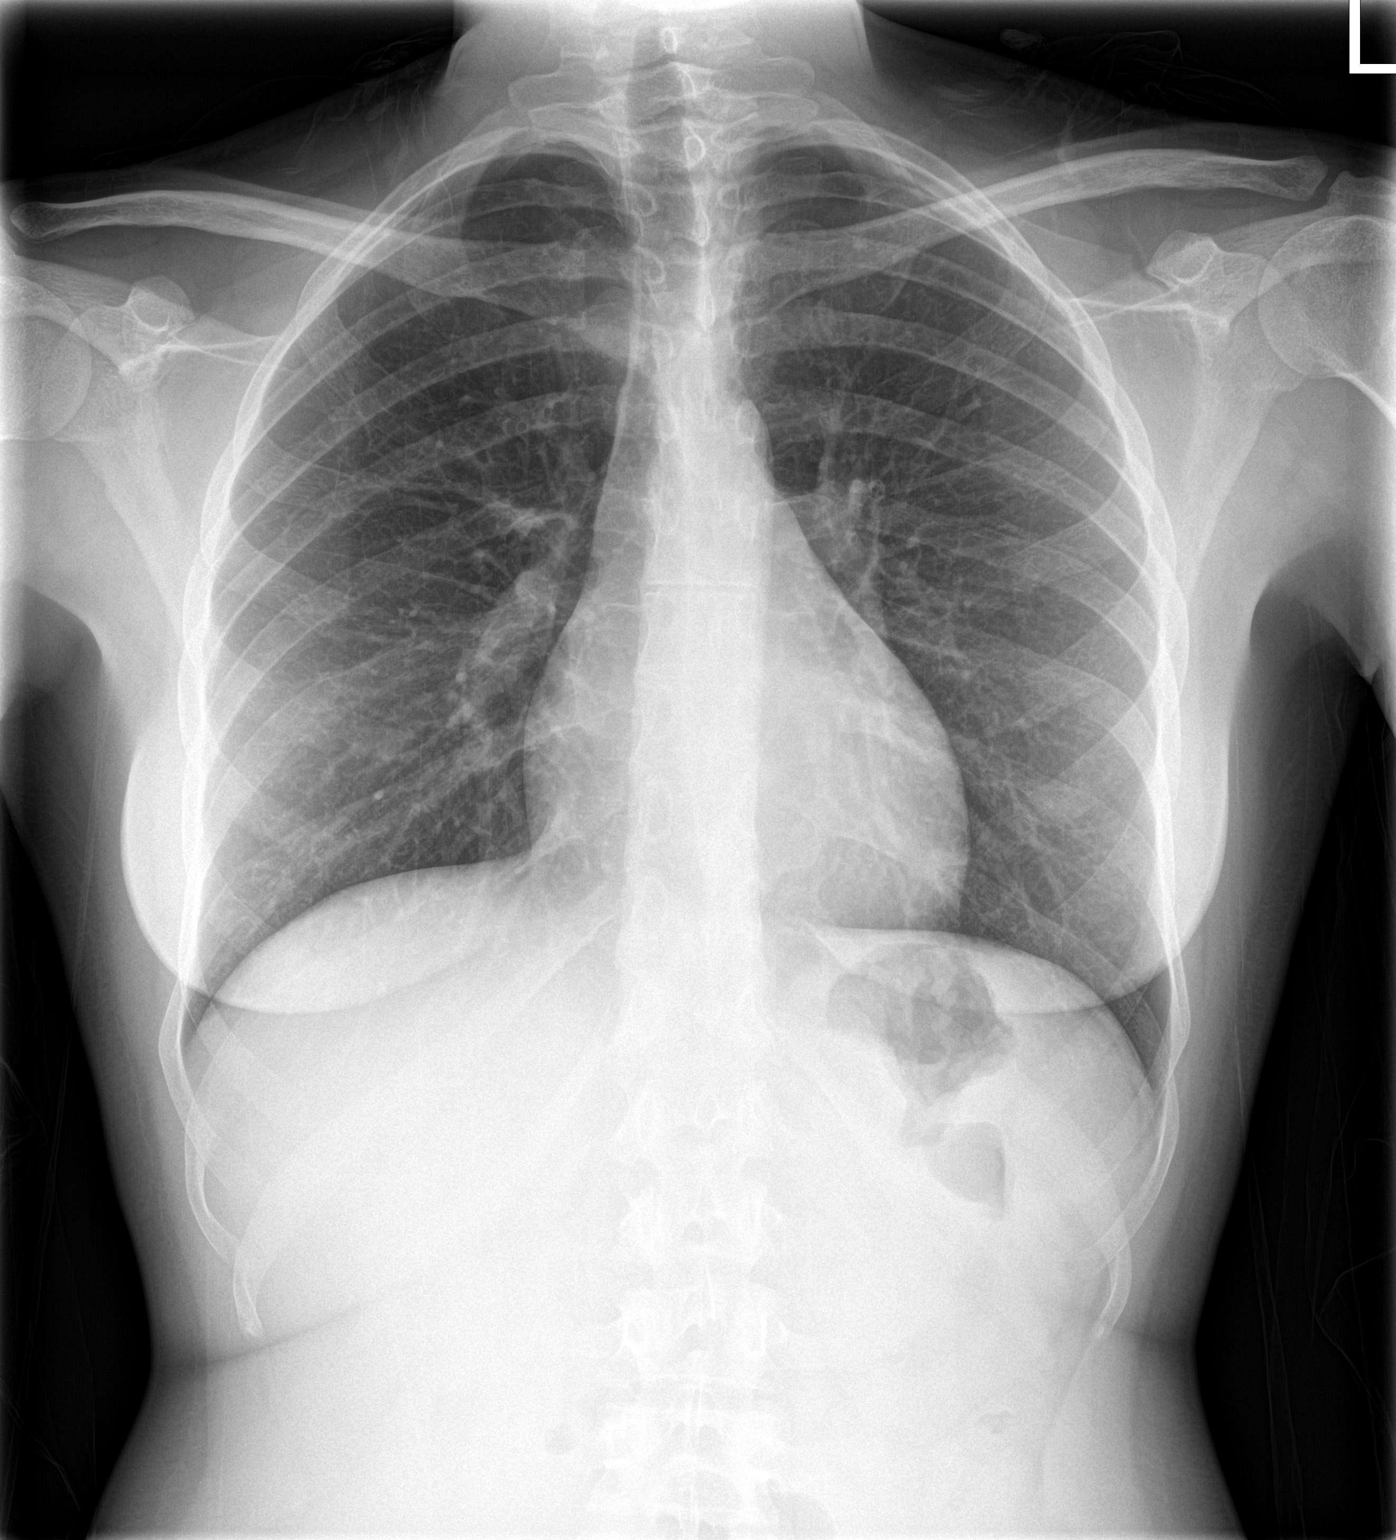

[chest lat]
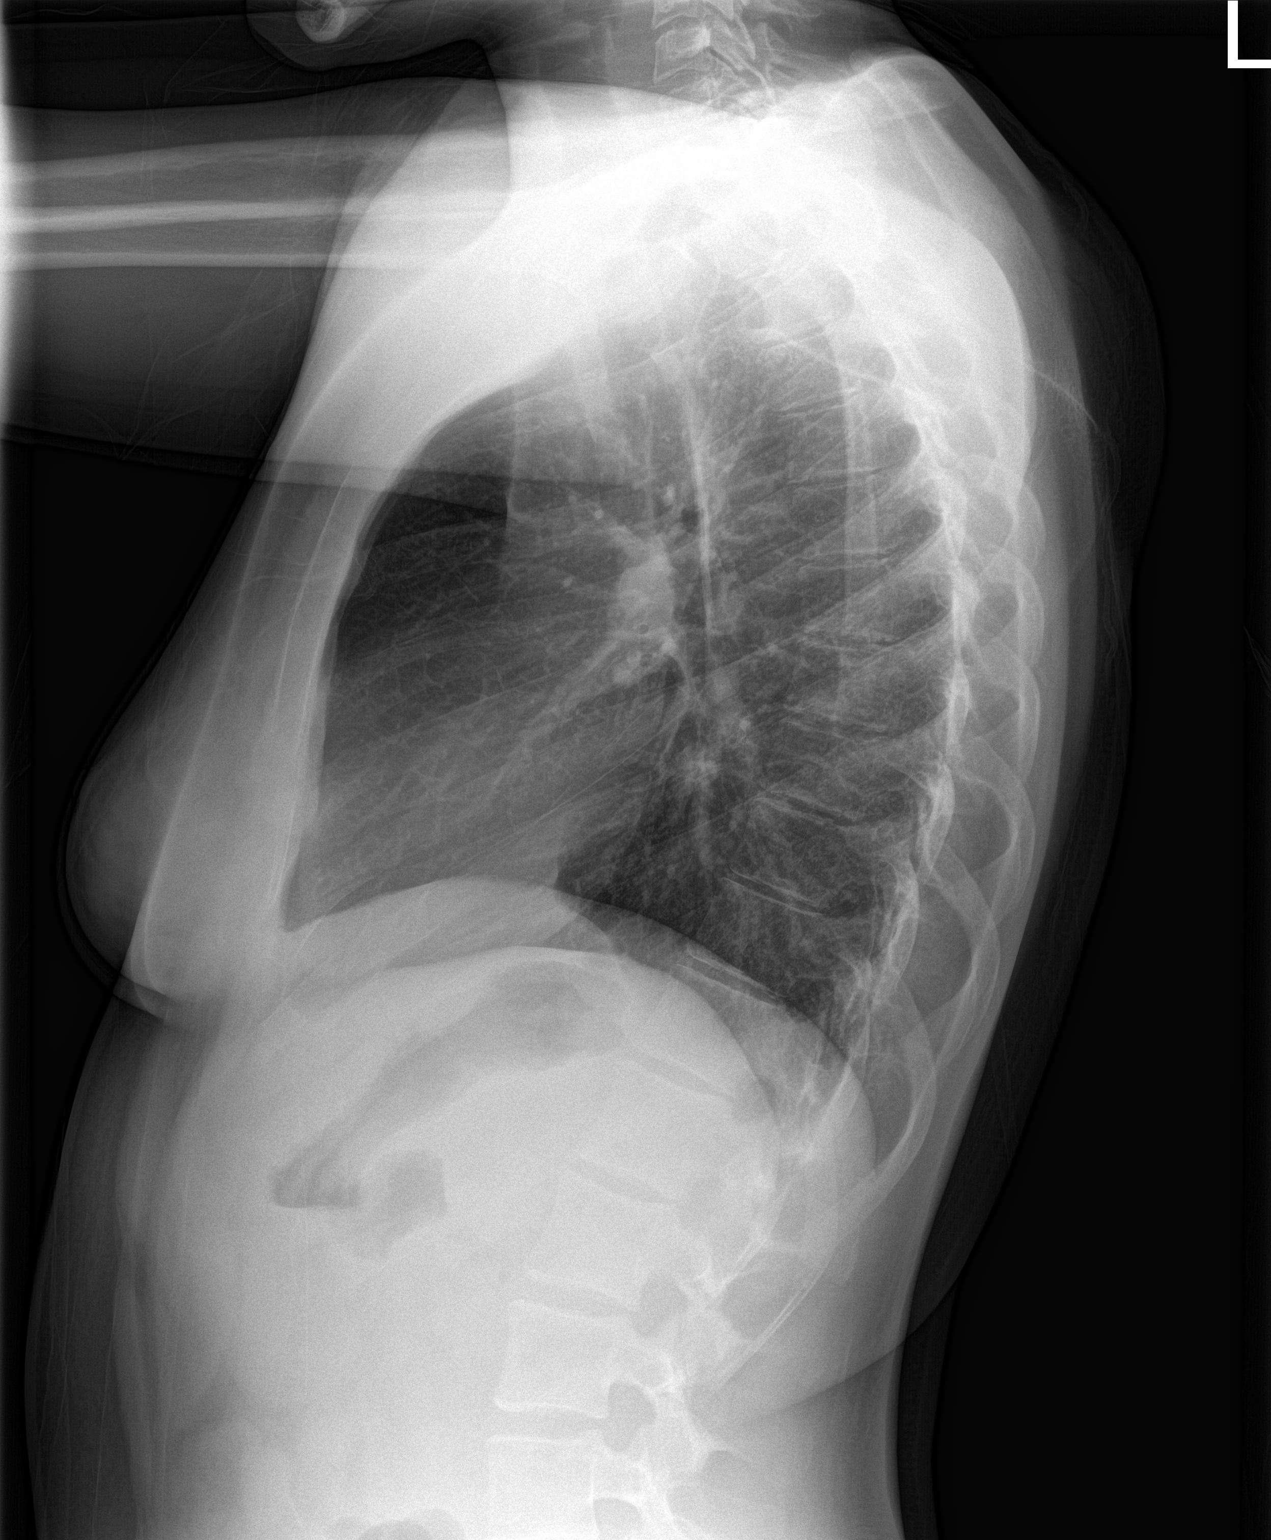

[2 of 2 positions shown; findings below may reference images not displayed]

FINDINGS: Cardiomediastinal silhouette is stable. No acute infiltrate or
pleural effusion. No pulmonary edema. Bony thorax is unremarkable.
IMPRESSION: No active cardiopulmonary disease.

## 2015-02-04 MED ORDER — FERROUS SULFATE 325 (65 FE) MG PO TABS: 325.0000 mg | ORAL_TABLET | Freq: Two times a day (BID) | ORAL | Status: DC

## 2015-02-04 MED ORDER — SODIUM CHLORIDE 0.9 % IV BOLUS (SEPSIS)
1000.0000 mL | Freq: Once | INTRAVENOUS | Status: AC
  Administered 2015-02-04: 1000 mL via INTRAVENOUS

## 2015-02-04 MED ORDER — POTASSIUM CHLORIDE CRYS ER 20 MEQ PO TBCR
40.0000 meq | EXTENDED_RELEASE_TABLET | Freq: Once | ORAL | Status: AC
  Administered 2015-02-04: 40 meq via ORAL
  Filled 2015-02-04: qty 2

## 2015-02-04 NOTE — ED Notes (Signed)
Pt reports nasal congestion since Wednesday, also reports hx of anemia and feeling weak and tired.

## 2015-02-04 NOTE — ED Provider Notes (Signed)
CSN: [id number]     Arrival date & time 02/04/15  1146 History   First MD Initiated Contact with Patient 02/04/15 1216     Chief Complaint  Patient presents with  . Nasal Congestion     (Consider location/radiation/quality/duration/timing/severity/associated sxs/prior Treatment) HPI  Beverly Richards is a 24 y.o. female with PMH of Crohn's disease, anemia presenting with 2-3 day history of nasal congestion, subjective fevers, chills, sore throat, cough productive of mucus and generalized fatigue. Patient also reports history of anemia has required transfusions in the past. She has taking supplementation but reports she sometimes forgets. She denies any melanotic stool, hematochezia, hematemesis, coffee-ground emesis. Patient denies any nausea, vomiting, abdominal pain. No chest pain or shortness of breath.   Past Medical History  Diagnosis Date  . Ovarian cyst   . Anemia   . Monocytosis 10/21/2013  . Crohn's disease    Past Surgical History  Procedure Laterality Date  . No past surgeries    . Colonoscopy N/A 10/23/2013    Procedure: COLONOSCOPY;  Surgeon: Missy Sabins, MD;  Location: [facility];  Service: Endoscopy;  Laterality: N/A;   Family History  Problem Relation Age of Onset  . Diabetes Mother   . Hyperlipidemia Mother   . Diabetes Maternal Grandmother   . Hyperlipidemia Maternal Grandmother   . Birth defects Paternal Grandmother     Breast Cancer   History  Substance Use Topics  . Smoking status: Never Smoker   . Smokeless tobacco: Never Used  . Alcohol Use: No   OB History    No data available     Review of Systems 10 Systems reviewed and are negative for acute change except as noted in the HPI.    Allergies  Benadryl and Penicillins  Home Medications   Prior to Admission medications   Medication Sig Start Date End Date Taking? Authorizing Provider  ferrous sulfate 325 (65 FE) MG tablet Take 1 tablet (325 mg total) by mouth 2 (two) times daily with a  meal. 10/24/13   Orson Eva, MD  ferrous sulfate 325 (65 FE) MG tablet Take 1 tablet (325 mg total) by mouth 2 (two) times daily with a meal. 02/04/15   Al Corpus, PA-C  HYDROcodone-acetaminophen (NORCO/VICODIN) 5-325 MG per tablet Take 1-2 tablets every 6 hours as needed for severe pain 07/27/14   Brunetta Jeans, PA-C  HYDROcodone-acetaminophen (NORCO/VICODIN) 5-325 MG per tablet Take 1-2 tablets by mouth every 6 (six) hours as needed (for pain). 08/28/14   Shanon Rosser, MD  Multiple Vitamin (MULTIVITAMIN WITH MINERALS) TABS tablet Take 1 tablet by mouth every morning.    Historical Provider, MD  ondansetron (ZOFRAN) 4 MG tablet Take 1 tablet (4 mg total) by mouth every 6 (six) hours. 05/12/14   Glendell Docker, NP  predniSONE (DELTASONE) 10 MG tablet Take 3 tablets (30 mg total) by mouth daily with breakfast. 08/24/14   Brunetta Jeans, PA-C   BP 119/75 mmHg  Pulse 84  Temp(Src) 99.1 F (37.3 C) (Oral)  Resp 18  Ht 5\' 3"  (1.6 m)  Wt 110 lb (49.896 kg)  BMI 19.49 kg/m2  SpO2 100%  LMP 01/24/2015 Physical Exam  Constitutional: She appears well-developed and well-nourished. No distress.  HENT:  Head: Normocephalic and atraumatic.  Nose: Right sinus exhibits no maxillary sinus tenderness and no frontal sinus tenderness. Left sinus exhibits no maxillary sinus tenderness and no frontal sinus tenderness.  Mouth/Throat: Mucous membranes are normal. Posterior oropharyngeal edema and posterior oropharyngeal erythema present.  No oropharyngeal exudate.  No trismus or uvula deviation.  Eyes: Conjunctivae and EOM are normal. Right eye exhibits no discharge. Left eye exhibits no discharge.  Neck: Normal range of motion. Neck supple.  Cardiovascular: Normal rate, regular rhythm and normal heart sounds.   Pulmonary/Chest: Effort normal and breath sounds normal. No respiratory distress. She has no wheezes. She has no rales.  Abdominal: Soft. Bowel sounds are normal. She exhibits no distension. There  is no tenderness.  Lymphadenopathy:    She has cervical adenopathy.  Neurological: She is alert.  Speech fluent and goal oriented. Patient moves all four extremities without any focal neurological deficits and has no facial droop. Normal gait.  Skin: Skin is warm and dry. She is not diaphoretic.  Nursing note and vitals reviewed.   ED Course  Procedures (including critical care time) Labs Review Labs Reviewed  CBC - Abnormal; Notable for the following:    RBC 3.80 (*)    Hemoglobin 8.8 (*)    HCT 27.9 (*)    MCV 73.4 (*)    MCH 23.2 (*)    RDW 16.1 (*)    Platelets 441 (*)    All other components within normal limits  BASIC METABOLIC PANEL - Abnormal; Notable for the following:    Potassium 3.0 (*)    CO2 21 (*)    Calcium 8.3 (*)    All other components within normal limits  PREGNANCY, URINE    Imaging Review Dg Chest 2 View  02/04/2015   CLINICAL DATA:  Cough, congestion  EXAM: CHEST  2 VIEW  COMPARISON:  10/22/2013  FINDINGS: Cardiomediastinal silhouette is stable. No acute infiltrate or pleural effusion. No pulmonary edema. Bony thorax is unremarkable.  IMPRESSION: No active cardiopulmonary disease.   Electronically Signed   By: Lahoma Crocker M.D.   On: 02/04/2015 13:07     EKG Interpretation None      MDM   Final diagnoses:  Anemia, unspecified anemia type  Viral syndrome  Hypokalemia   Patient presenting with nasal congestion, cough and generalized fatigue. Patient with history of anemia. Pt tachycardic. Will obtain chest x-ray, basic labs and IV fluids ordered.  Pt reports history of heavy vaginal bleeding and HBG 8.8 today which pt states is her baseline. 5 months ago hemoglobin is 9.6. Patient reports she has not been taking her iron supplementation regularly. Patient denies any hematochezia, hematemesis, melanotic stool. Suspect this is chronic. Stress importance of taking iron supplementation daily. Follow[facility] for heavy vaginal bleeding.  Patient also with URI symptoms. Chest x-ray without pneumonia. Discussed  symptomatic treatment and follow-up with primary care for persistent symptoms. Pt with hypokalemia which was orally replenished in ED.  Discussed return precautions with patient. Discussed all results and patient verbalizes understanding and agrees with plan.     Al Corpus, PA-C 02/04/15 Isabela Ward, DO 02/04/15 1456

## 2015-02-04 NOTE — Discharge Instructions (Signed)
Return to the emergency room with worsening of symptoms, new symptoms or with symptoms that are concerning , especially fevers, stiff neck, worsening headache, nausea/vomiting, visual changes or slurred speech, chest pain, shortness of breath, cough with thick colored mucous or blood Drink plenty of fluids with electrolytes especially Gatorade. OTC cold medications such as mucinex, nyquil, dayquil are recommended. Chloraseptic for sore throat. Make sure to take your iron pills every day. Call for appointment for recheck with PCP. [facility]. Read below information and follow recommendations.  Anemia, Nonspecific Anemia is a condition in which the concentration of red blood cells or hemoglobin in the blood is below normal. Hemoglobin is a substance in red blood cells that carries oxygen to the tissues of the body. Anemia results in not enough oxygen reaching these tissues.  CAUSES  Common causes of anemia include:   Excessive bleeding. Bleeding may be internal or external. This includes excessive bleeding from periods (in women) or from the intestine.   Poor nutrition.   Chronic kidney, thyroid, and liver disease.  Bone marrow disorders that decrease red blood cell production.  Cancer and treatments for cancer.  HIV, AIDS, and their treatments.  Spleen problems that increase red blood cell destruction.  Blood disorders.  Excess destruction of red blood cells due to infection, medicines, and autoimmune disorders. SIGNS AND SYMPTOMS   Minor weakness.   Dizziness.   Headache.  Palpitations.   Shortness of breath, especially with exercise.   Paleness.  Cold sensitivity.  Indigestion.  Nausea.  Difficulty sleeping.  Difficulty concentrating. Symptoms may occur suddenly or they may develop slowly.  DIAGNOSIS  Additional blood tests are often needed. These help your health care provider determine  the best treatment. Your health care provider will check your stool for blood and look for other causes of blood loss.  TREATMENT  Treatment varies depending on the cause of the anemia. Treatment can include:   Supplements of iron, vitamin C94, or folic acid.   Hormone medicines.   A blood transfusion. This may be needed if blood loss is severe.   Hospitalization. This may be needed if there is significant continual blood loss.   Dietary changes.  Spleen removal. HOME CARE INSTRUCTIONS Keep all follow-up appointments. It often takes many weeks to correct anemia, and having your health care provider check on your condition and your response to treatment is very important. SEEK IMMEDIATE MEDICAL CARE IF:   You develop extreme weakness, shortness of breath, or chest pain.   You become dizzy or have trouble concentrating.  You develop heavy vaginal bleeding.   You develop a rash.   You have bloody or black, tarry stools.   You faint.   You vomit up blood.   You vomit repeatedly.   You have abdominal pain.  You have a fever or persistent symptoms for more than 2-3 days.   You have a fever and your symptoms suddenly get worse.   You are dehydrated.  MAKE SURE YOU:  Understand these instructions.  Will watch your condition.  Will get help right away if you are not doing well or get worse. Document Released: 10/25/2004 Document Revised: 05/20/2013 Document Reviewed: 03/13/2013 [facility] Patient Information 2015 [place], [place]. This information is not intended to replace advice given to you by your health care provider. Make sure you discuss any questions you have with your health care provider.

## 2015-03-30 ENCOUNTER — Other Ambulatory Visit: Payer: Self-pay | Admitting: Gastroenterology

## 2015-03-30 DIAGNOSIS — K5000 Crohn's disease of small intestine without complications: Secondary | ICD-10-CM

## 2015-04-13 ENCOUNTER — Inpatient Hospital Stay
Admission: RE | Admit: 2015-04-13 | Discharge: 2015-04-13 | Disposition: A | Discharge status: Home / Self Care | Payer: 59 | Source: Ambulatory Visit | Attending: Gastroenterology | Admitting: Gastroenterology

## 2015-04-13 DIAGNOSIS — K5000 Crohn's disease of small intestine without complications: Secondary | ICD-10-CM

## 2015-04-14 ENCOUNTER — Inpatient Hospital Stay: Admission: RE | Admit: 2015-04-14 | Payer: 59 | Source: Ambulatory Visit

## 2015-04-18 ENCOUNTER — Ambulatory Visit
Admission: RE | Admit: 2015-04-18 | Discharge: 2015-04-18 | Disposition: A | Discharge status: Home / Self Care | Payer: 59 | Source: Ambulatory Visit | Attending: Gastroenterology | Admitting: Gastroenterology

## 2015-04-18 IMAGING — CT CT ENTEROGRAPHY (ABD-PELV W/ CM)
1 of 2 series · 15 of 32 positions shown, 19 images · IV contrast (APPLIED)
Comparison: [DATE]

CLINICAL DATA: Crohn's disease. Generalized abdominal pain common
nausea and vomiting, and diarrhea for 1 week.

EXAM:
CT ABDOMEN AND PELVIS WITH CONTRAST (ENTEROGRAPHY)
TECHNIQUE: Multidetector CT of the abdomen and pelvis during bolus
administration of intravenous contrast. Negative oral contrast
VoLumen was given.
CONTRAST:  100mL [JO] IOPAMIDOL ([JO]) INJECTION 61%

[Series 2: enterography 5.0 i41s 1 · axial · 0.59mm/px · z∈[-367,+13]mm · 15 of 84 slices shown, 19 images]
[im 4/84  soft-tissue]
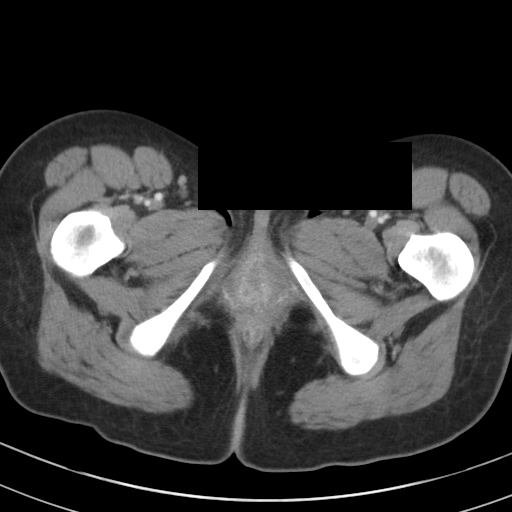
[im 4/84  bone]
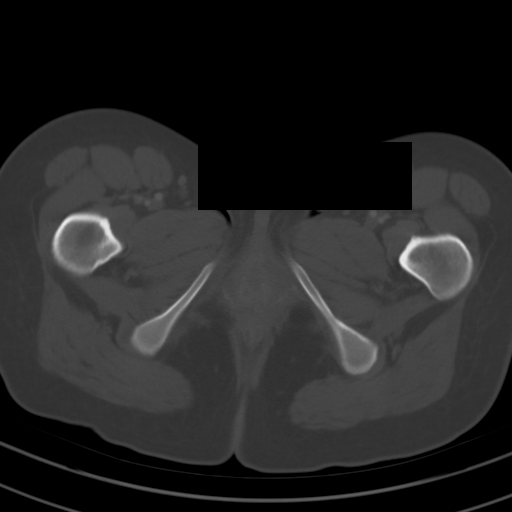
[im 10/84  soft-tissue]
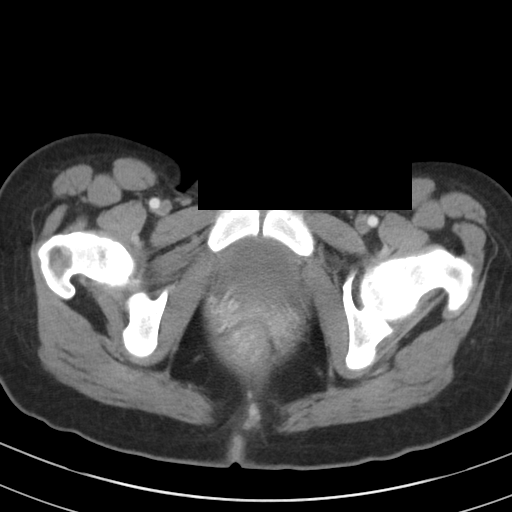
[im 17/84  soft-tissue]
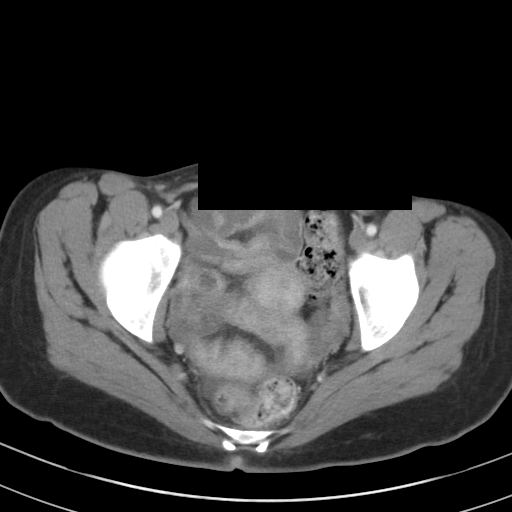
[im 24/84  soft-tissue]
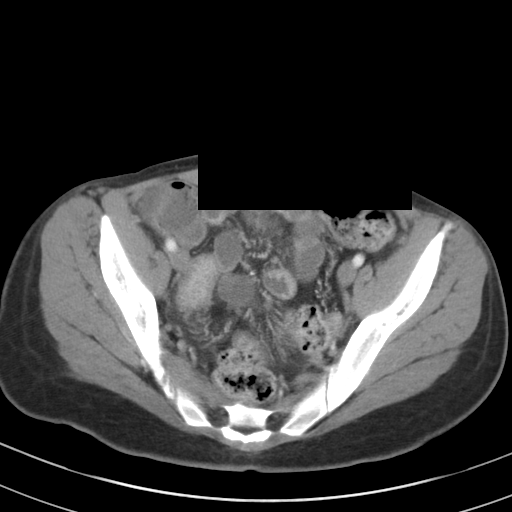
[im 30/84  soft-tissue]
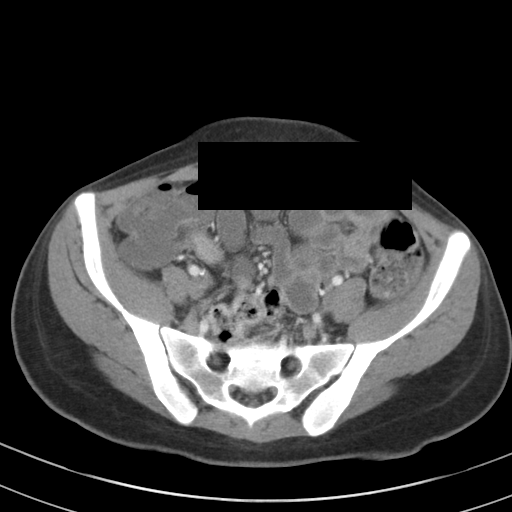
[im 37/84  soft-tissue]
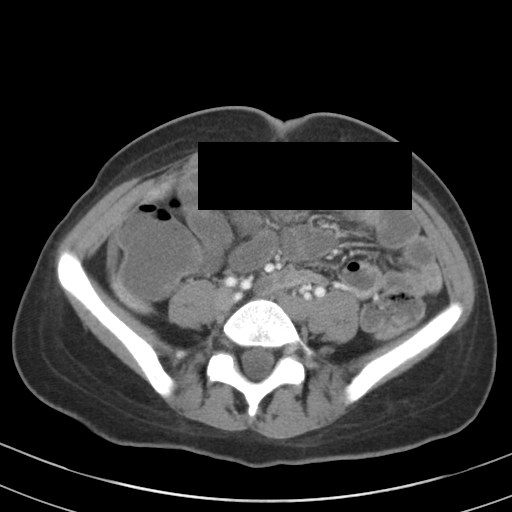
[im 44/84  soft-tissue]
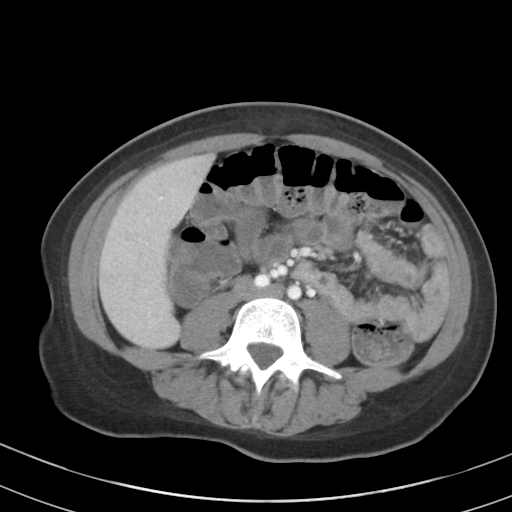
[im 47/84  soft-tissue]
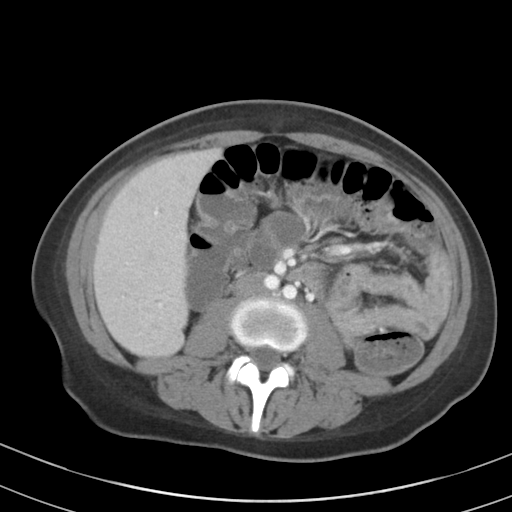
[im 54/84  soft-tissue]
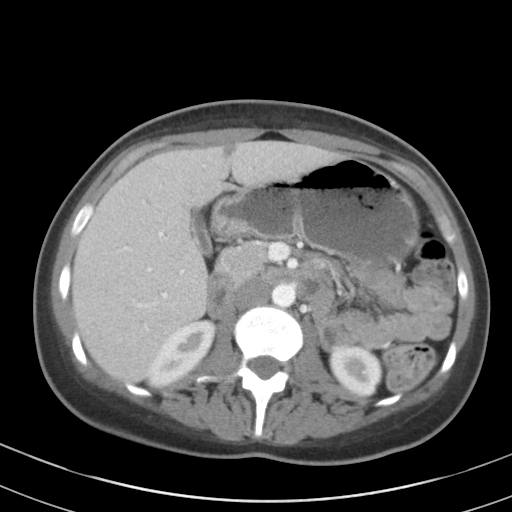
[im 54/84  bone]
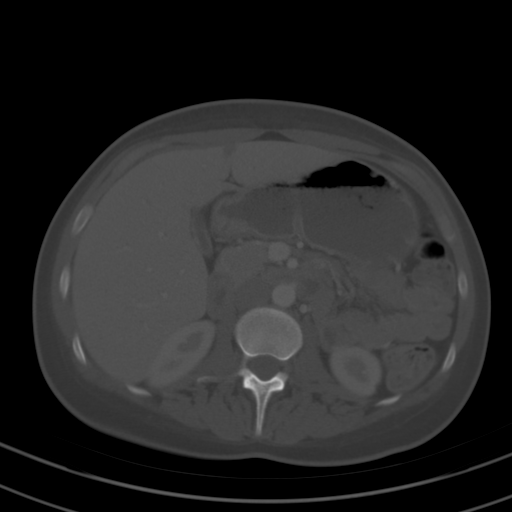
[im 60/84  soft-tissue]
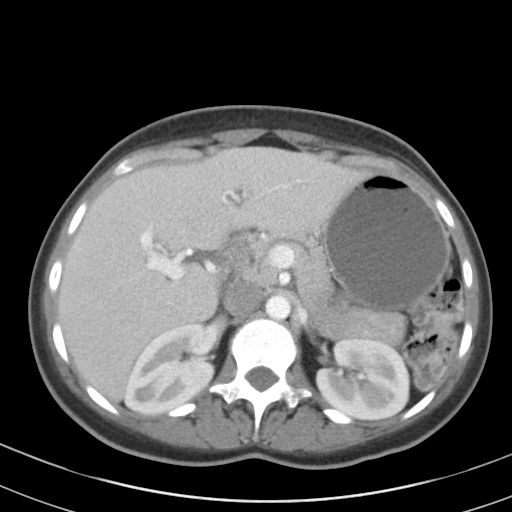
[im 67/84  soft-tissue]
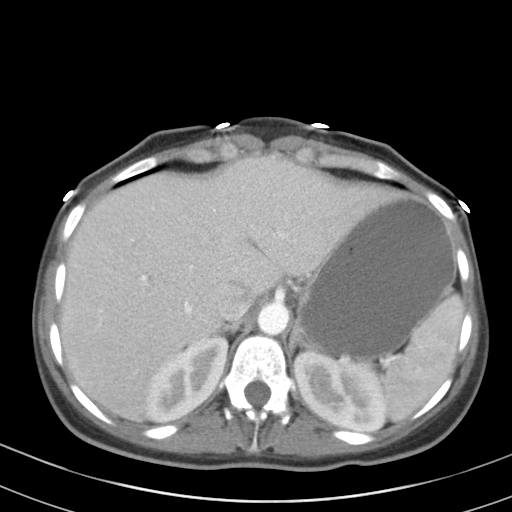
[im 70/84  lung]
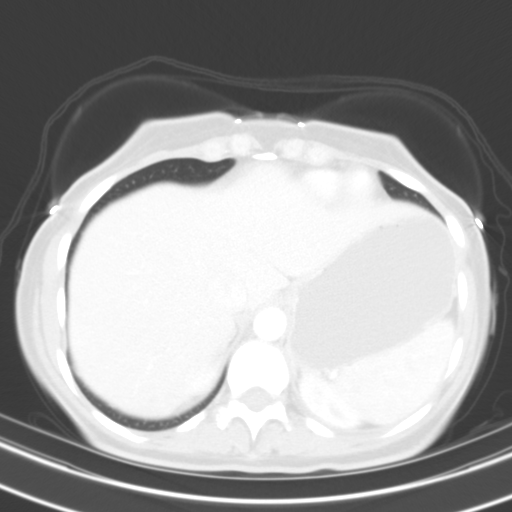
[im 74/84  soft-tissue]
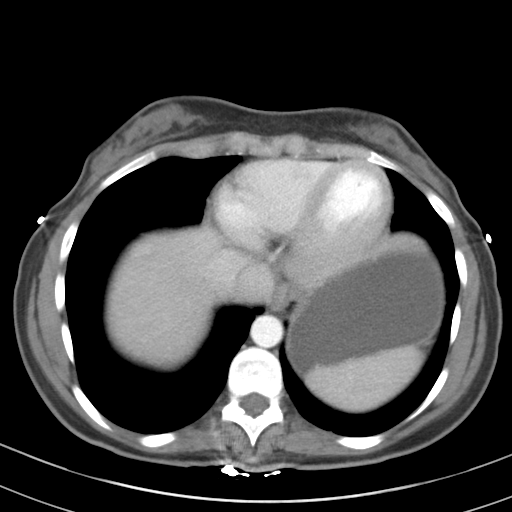
[im 74/84  lung]
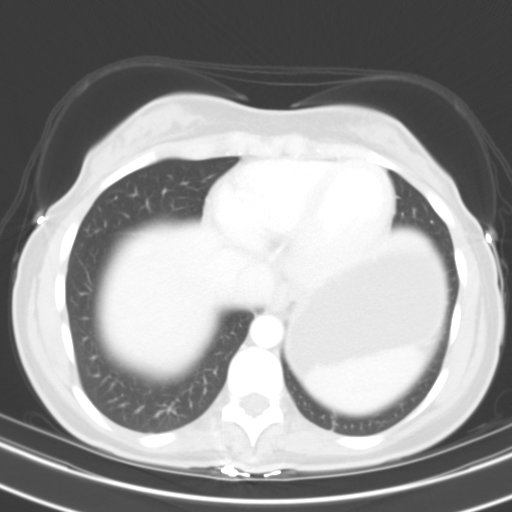
[im 77/84  lung]
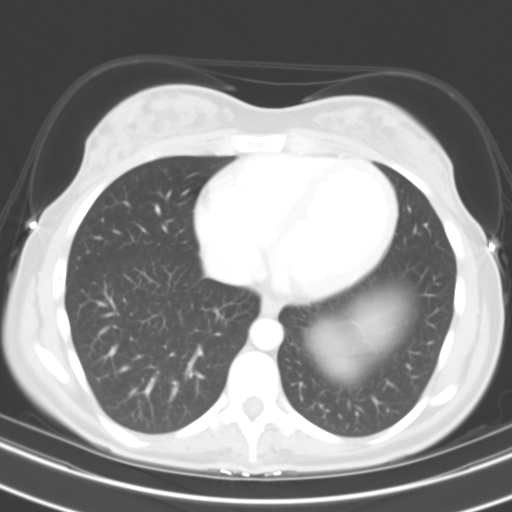
[im 80/84  soft-tissue]
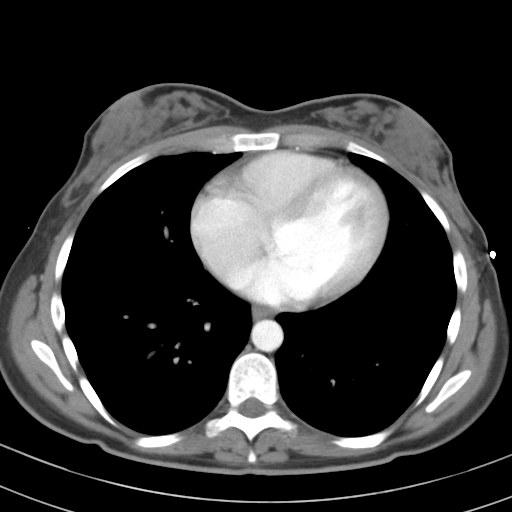
[im 80/84  lung]
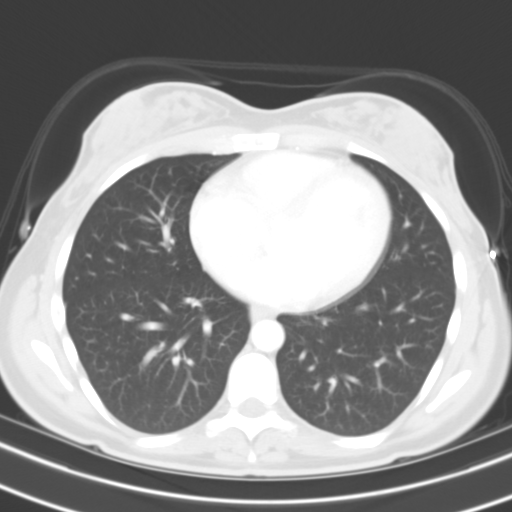

[15 of 32 positions shown; findings below may reference images not displayed]

FINDINGS: Lower Chest: No acute findings.

Hepatobiliary: No masses or other significant abnormality
identified. Focal fatty infiltration seen adjacent falciform
ligament. Gallbladder is unremarkable.

Pancreas: No mass, inflammatory changes, or other significant
abnormality identified.

Spleen:  Within normal limits in size and appearance.

Adrenals:  No masses identified.

Kidneys/Urinary Tract: No evidence of masses or hydronephrosis. Tiny
2-3 mm nonobstructive calculus noted in upper pole of right kidney.

Stomach/Bowel/Peritoneum: Wall thickening and abnormal contrast
enhancement is seen involving multiple loops of distal ileum in the
pelvis as well as the terminal ileum. This is consistent with active
Crohn's disease and is similar appearance to prior study.

No evidence of fistulization or extraluminal gas or fluid
collections.

Vascular/Lymphatic: No pathologically enlarged lymph nodes
identified. No abdominal aortic aneurysm or other significant
retroperitoneal abnormality demonstrated.

Reproductive:  No mass or other significant abnormality identified.

Other:  None.

Musculoskeletal:  No suspicious bone lesions identified.
IMPRESSION: Abnormal wall thickening and contrast enhancement involving multiple
loops of distal ileum including the terminal ileum, consistent with
active Crohn's disease.

No evidence of abscess, bowel obstruction, or other complication.

Tiny nonobstructive right renal calculus.

## 2015-04-18 MED ORDER — IOPAMIDOL (ISOVUE-300) INJECTION 61%
100.0000 mL | Freq: Once | INTRAVENOUS | Status: AC | PRN
  Administered 2015-04-18: 100 mL via INTRAVENOUS

## 2015-06-19 ENCOUNTER — Emergency Department (HOSPITAL_BASED_OUTPATIENT_CLINIC_OR_DEPARTMENT_OTHER)
Admission: EM | Admit: 2015-06-19 | Discharge: 2015-06-19 | Disposition: A | Discharge status: Home / Self Care | Payer: 59 | Attending: Emergency Medicine | Admitting: Emergency Medicine

## 2015-06-19 ENCOUNTER — Encounter (HOSPITAL_BASED_OUTPATIENT_CLINIC_OR_DEPARTMENT_OTHER): Payer: Self-pay | Admitting: Adult Health

## 2015-06-19 ENCOUNTER — Emergency Department (HOSPITAL_BASED_OUTPATIENT_CLINIC_OR_DEPARTMENT_OTHER): Payer: 59

## 2015-06-19 DIAGNOSIS — K5000 Crohn's disease of small intestine without complications: Secondary | ICD-10-CM | POA: Diagnosis not present

## 2015-06-19 DIAGNOSIS — R1031 Right lower quadrant pain: Secondary | ICD-10-CM | POA: Diagnosis present

## 2015-06-19 DIAGNOSIS — Z7951 Long term (current) use of inhaled steroids: Secondary | ICD-10-CM | POA: Diagnosis not present

## 2015-06-19 DIAGNOSIS — Z8742 Personal history of other diseases of the female genital tract: Secondary | ICD-10-CM | POA: Insufficient documentation

## 2015-06-19 DIAGNOSIS — Z880 Allergy status to penicillin: Secondary | ICD-10-CM | POA: Insufficient documentation

## 2015-06-19 DIAGNOSIS — Z862 Personal history of diseases of the blood and blood-forming organs and certain disorders involving the immune mechanism: Secondary | ICD-10-CM | POA: Insufficient documentation

## 2015-06-19 DIAGNOSIS — Z79899 Other long term (current) drug therapy: Secondary | ICD-10-CM | POA: Insufficient documentation

## 2015-06-19 DIAGNOSIS — N3000 Acute cystitis without hematuria: Secondary | ICD-10-CM | POA: Diagnosis not present

## 2015-06-19 DIAGNOSIS — Z3202 Encounter for pregnancy test, result negative: Secondary | ICD-10-CM | POA: Insufficient documentation

## 2015-06-19 DIAGNOSIS — K5090 Crohn's disease, unspecified, without complications: Secondary | ICD-10-CM

## 2015-06-19 LAB — URINALYSIS, ROUTINE W REFLEX MICROSCOPIC
Glucose, UA: NEGATIVE mg/dL
KETONES UR: 40 mg/dL — AB
Nitrite: NEGATIVE
PH: 6 (ref 5.0–8.0)
Protein, ur: 30 mg/dL — AB
Specific Gravity, Urine: 1.036 — ABNORMAL HIGH (ref 1.005–1.030)
Urobilinogen, UA: 1 mg/dL (ref 0.0–1.0)

## 2015-06-19 LAB — URINE MICROSCOPIC-ADD ON

## 2015-06-19 LAB — COMPREHENSIVE METABOLIC PANEL
ALT: 10 U/L — AB (ref 14–54)
AST: 12 U/L — AB (ref 15–41)
Albumin: 2.5 g/dL — ABNORMAL LOW (ref 3.5–5.0)
Alkaline Phosphatase: 69 U/L (ref 38–126)
Anion gap: 12 (ref 5–15)
BUN: 11 mg/dL (ref 6–20)
CHLORIDE: 102 mmol/L (ref 101–111)
CO2: 22 mmol/L (ref 22–32)
CREATININE: 0.68 mg/dL (ref 0.44–1.00)
Calcium: 8.3 mg/dL — ABNORMAL LOW (ref 8.9–10.3)
Glucose, Bld: 89 mg/dL (ref 65–99)
Potassium: 3.5 mmol/L (ref 3.5–5.1)
Sodium: 136 mmol/L (ref 135–145)
Total Bilirubin: 0.6 mg/dL (ref 0.3–1.2)
Total Protein: 7.1 g/dL (ref 6.5–8.1)

## 2015-06-19 LAB — CBC
HCT: 25.4 % — ABNORMAL LOW (ref 36.0–46.0)
HEMOGLOBIN: 7.6 g/dL — AB (ref 12.0–15.0)
MCH: 21.7 pg — AB (ref 26.0–34.0)
MCHC: 29.9 g/dL — ABNORMAL LOW (ref 30.0–36.0)
MCV: 72.4 fL — AB (ref 78.0–100.0)
PLATELETS: 692 10*3/uL — AB (ref 150–400)
RBC: 3.51 MIL/uL — AB (ref 3.87–5.11)
RDW: 18.3 % — ABNORMAL HIGH (ref 11.5–15.5)
WBC: 4.2 10*3/uL (ref 4.0–10.5)

## 2015-06-19 LAB — LIPASE, BLOOD: LIPASE: 18 U/L — AB (ref 22–51)

## 2015-06-19 LAB — SEDIMENTATION RATE: Sed Rate: 80 mm/hr — ABNORMAL HIGH (ref 0–22)

## 2015-06-19 LAB — PREGNANCY, URINE: Preg Test, Ur: NEGATIVE

## 2015-06-19 IMAGING — CT CT ABD-PELV W/ CM
2 of 4 series · 16 of 46 positions shown, 18 images · IV contrast (omnipaque)
Comparison: [DATE]

CLINICAL DATA: Right lower quadrant pain for 1 week. Fever,
vomiting, and diarrhea. Pain across the abdomen. Eating makes the
pain worse. History of Crohn disease and ovarian cyst.

EXAM:
CT ABDOMEN AND PELVIS WITH CONTRAST
TECHNIQUE: Multidetector CT imaging of the abdomen and pelvis was performed
using the standard protocol following bolus administration of
intravenous contrast.
CONTRAST:  50mL OMNIPAQUE IOHEXOL 300 MG/ML SOLN, 80mL OMNIPAQUE
IOHEXOL 300 MG/ML SOLN

[Series 2: abd/pelvis 5.0 b31f · axial · 0.63mm/px · z∈[-410,-50]mm · 13 of 82 slices shown, 15 images]
[im 7/82  soft-tissue]
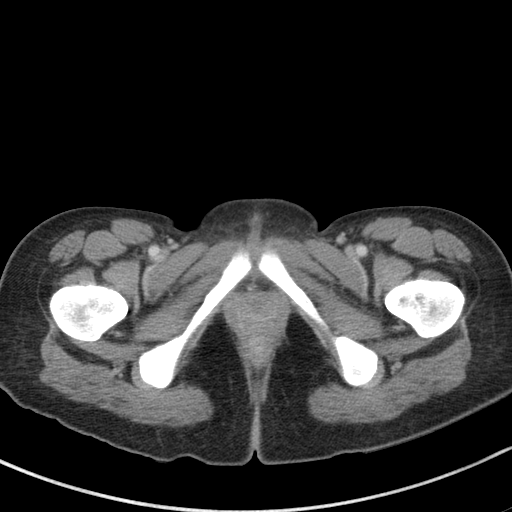
[im 7/82  bone]
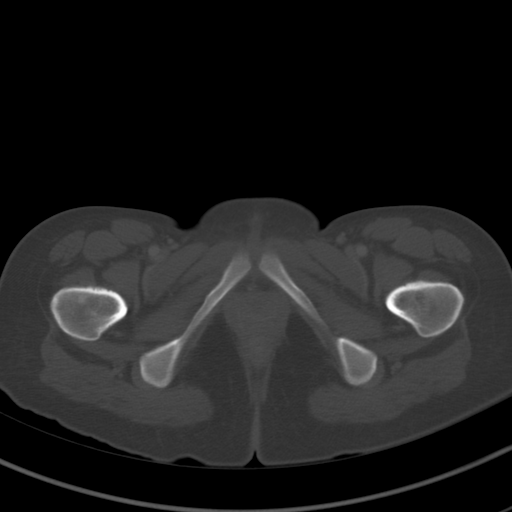
[im 13/82  soft-tissue]
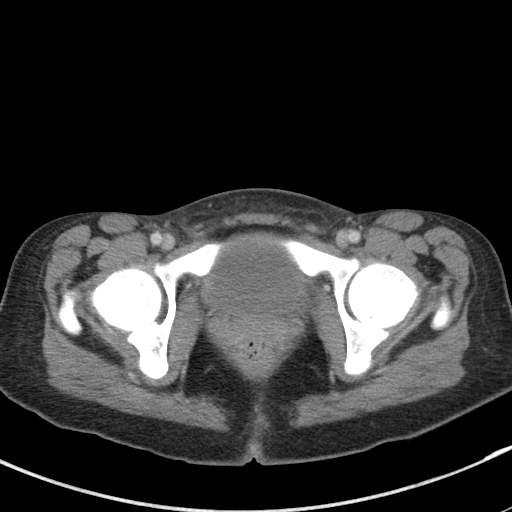
[im 19/82  soft-tissue]
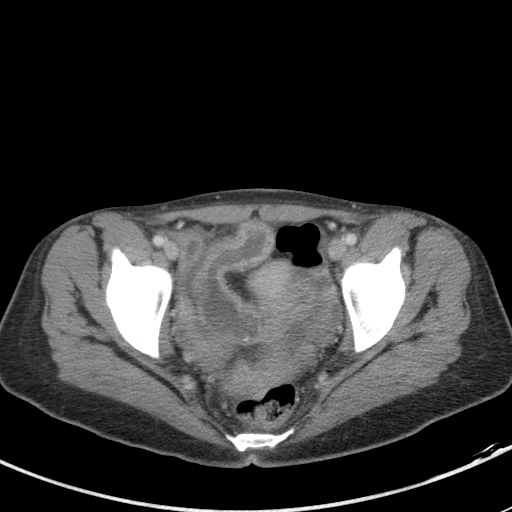
[im 25/82  soft-tissue]
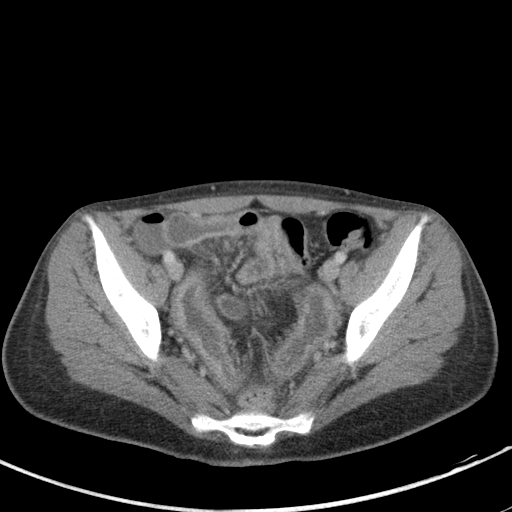
[im 31/82  soft-tissue]
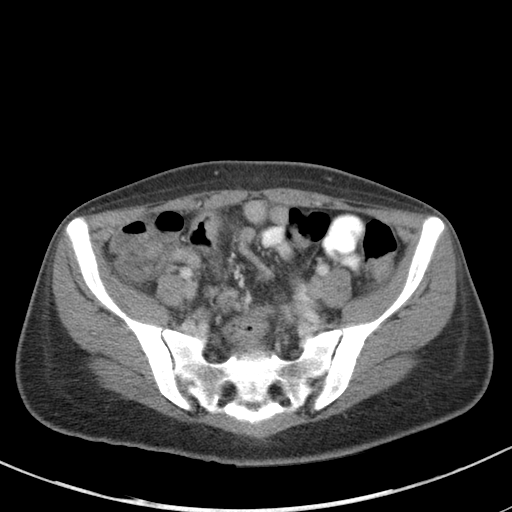
[im 37/82  soft-tissue]
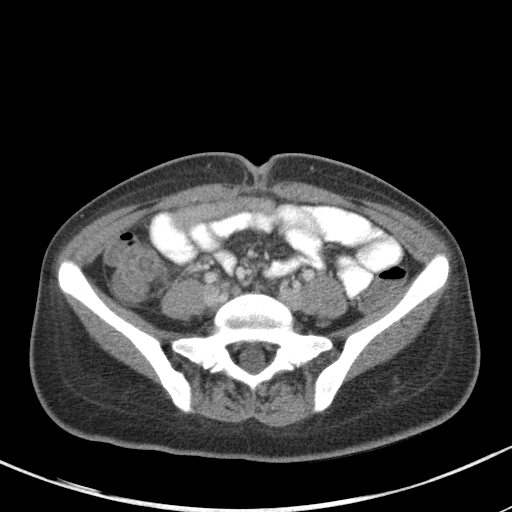
[im 43/82  soft-tissue]
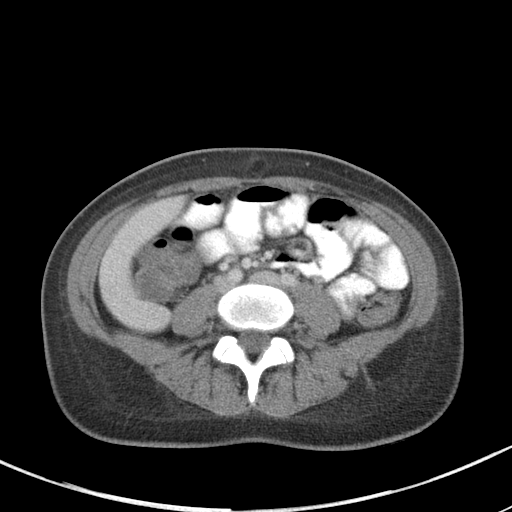
[im 49/82  soft-tissue]
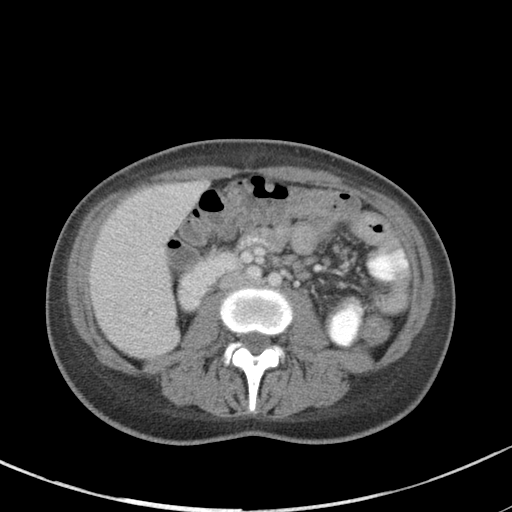
[im 55/82  soft-tissue]
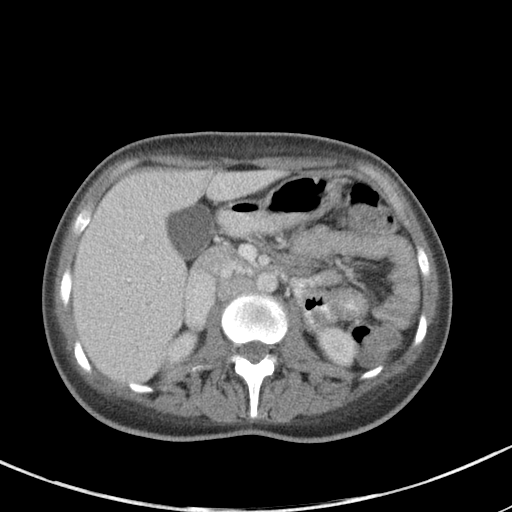
[im 55/82  bone]
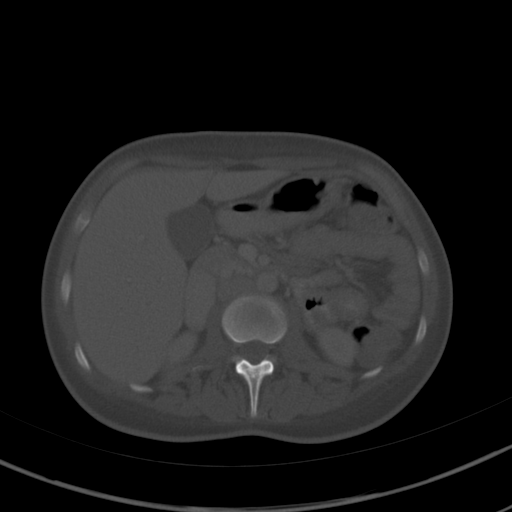
[im 61/82  soft-tissue]
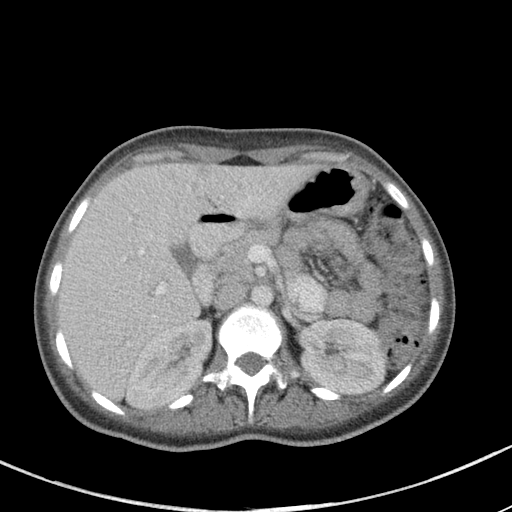
[im 67/82  soft-tissue]
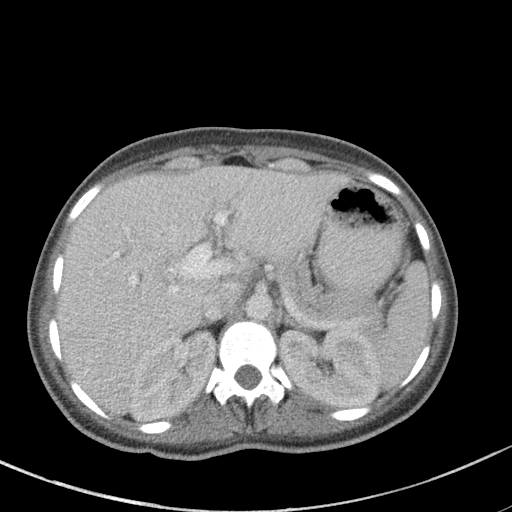
[im 73/82  soft-tissue]
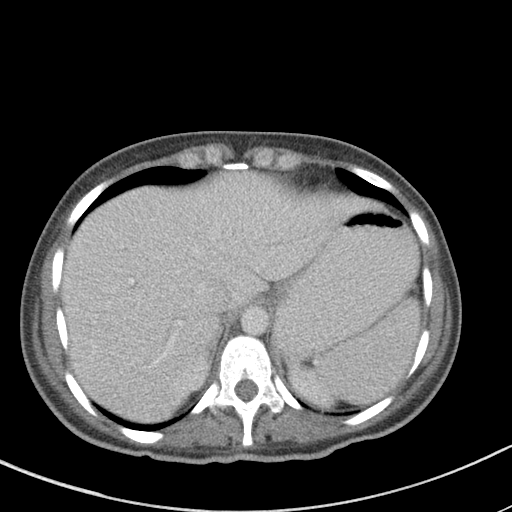
[im 79/82  soft-tissue]
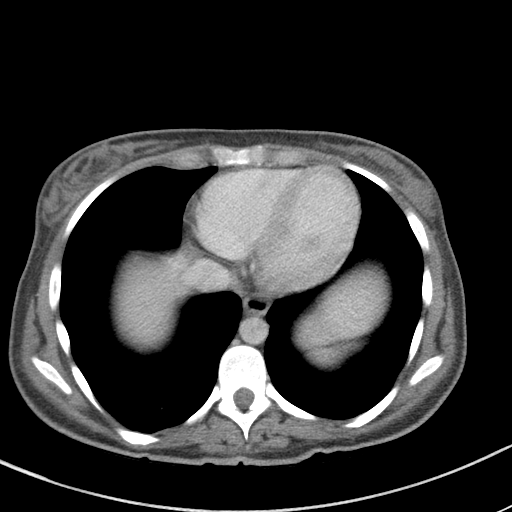

[Series 5: abd/pelvis 3.0 coronal · coronal · 0.79mm/px · 3 of 68 slices shown]
[im 23/68  soft-tissue]
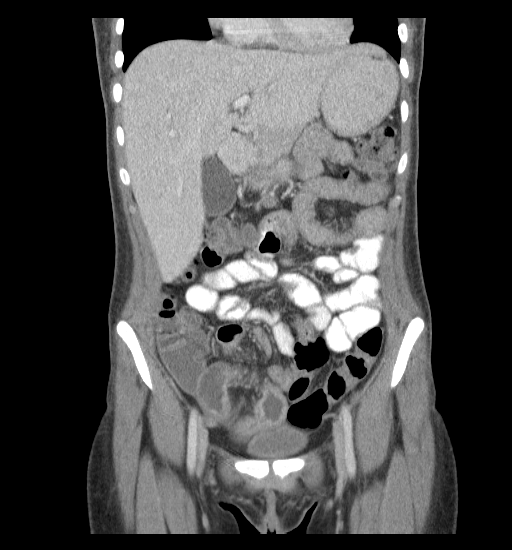
[im 30/68  soft-tissue]
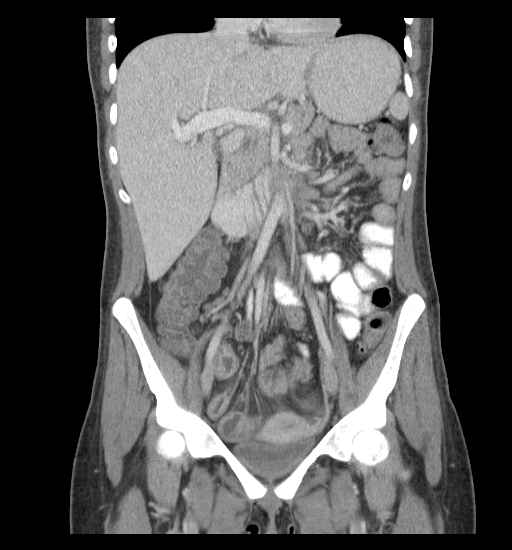
[im 38/68  soft-tissue]
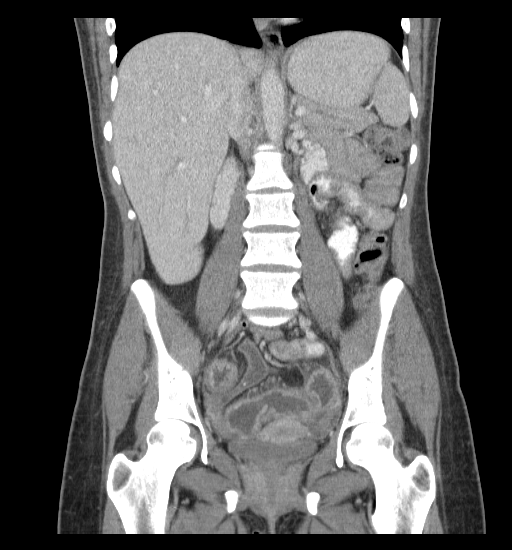

[16 of 46 positions shown; findings below may reference images not displayed]

FINDINGS: Lung bases are clear.

Focal fatty infiltration adjacent to the falciform ligament of the
liver. Gallbladder, spleen, pancreas, adrenal glands, abdominal
aorta, inferior vena cava, and retroperitoneal lymph nodes are
unremarkable. Calcifications in both kidneys consistent with stones.
Largest is in the right upper pole and measures 3 mm diameter this
stone is unchanged in position since previous study. No
hydronephrosis or hydroureter. Renal nephrograms are symmetrical.
Stomach, small bowel, and colon are not abnormally distended. There
is diffuse thickening of the wall of the pelvic small bowel likely
involving mid and distal ileum. A similar appearance was present
previously in this is likely due to inflammatory bowel disease
consistent with history of Crohn's. Mild infiltration in the
adjacent mesenteric. No abscess or fistula is identified. No
proximal obstruction. No free air or free fluid in the abdomen.

Pelvis: Appendix is normal. Uterus and ovaries are not enlarged. No
free or loculated pelvic fluid collections. No pelvic mass or
lymphadenopathy. No destructive bone lesions.
IMPRESSION: Inflammatory thickening in the wall of the distal small bowel,
predominantly in the mid and distal ileum consistent with history of
Crohn's disease. This likely suggest active disease. No fistula or
abscess is identified. Appearance is similar to prior study.
Nonobstructing intrarenal stones.

## 2015-06-19 MED ORDER — IOHEXOL 300 MG/ML  SOLN
80.0000 mL | Freq: Once | INTRAMUSCULAR | Status: AC | PRN
  Administered 2015-06-19: 80 mL via INTRAVENOUS

## 2015-06-19 MED ORDER — ONDANSETRON 4 MG PO TBDP
ORAL_TABLET | ORAL | Status: DC
Start: 2015-06-19 — End: 2019-05-06

## 2015-06-19 MED ORDER — IOHEXOL 300 MG/ML  SOLN
50.0000 mL | Freq: Once | INTRAMUSCULAR | Status: AC | PRN
  Administered 2015-06-19: 50 mL via ORAL

## 2015-06-19 MED ORDER — ONDANSETRON HCL 4 MG/2ML IJ SOLN
4.0000 mg | INTRAMUSCULAR | Status: DC | PRN
  Administered 2015-06-19: 4 mg via INTRAVENOUS
  Filled 2015-06-19: qty 2

## 2015-06-19 MED ORDER — HYDROCODONE-ACETAMINOPHEN 5-325 MG PO TABS: 1.0000 | ORAL_TABLET | Freq: Four times a day (QID) | ORAL | Status: DC | PRN

## 2015-06-19 MED ORDER — ACETAMINOPHEN 325 MG PO TABS
650.0000 mg | ORAL_TABLET | Freq: Once | ORAL | Status: AC
  Administered 2015-06-19: 650 mg via ORAL
  Filled 2015-06-19: qty 2

## 2015-06-19 MED ORDER — CIPROFLOXACIN HCL 500 MG PO TABS: 500.0000 mg | ORAL_TABLET | Freq: Two times a day (BID) | ORAL | Status: DC

## 2015-06-19 MED ORDER — PREDNISONE 20 MG PO TABS
20.0000 mg | ORAL_TABLET | Freq: Once | ORAL | Status: AC
  Administered 2015-06-19: 20 mg via ORAL
  Filled 2015-06-19: qty 1

## 2015-06-19 MED ORDER — HYDROMORPHONE HCL 1 MG/ML IJ SOLN
1.0000 mg | Freq: Once | INTRAMUSCULAR | Status: AC
  Administered 2015-06-19: 1 mg via INTRAVENOUS
  Filled 2015-06-19: qty 1

## 2015-06-19 MED ORDER — PREDNISONE 20 MG PO TABS: ORAL_TABLET | ORAL | Status: DC

## 2015-06-19 MED ORDER — SODIUM CHLORIDE 0.9 % IV BOLUS (SEPSIS)
1000.0000 mL | Freq: Once | INTRAVENOUS | Status: AC
  Administered 2015-06-19: 1000 mL via INTRAVENOUS

## 2015-06-19 NOTE — ED Notes (Signed)
57, laughing, talking with family at [facility], NAD, calm, updated, denies pain or nausea, results reviewed.

## 2015-06-19 NOTE — ED Notes (Signed)
Presents with lower right quadrant abdominal pain began one week ago associated with nausea, diarrhea and fatigue. HX of crohn's disease-pt states this feels very similair to her flares-febrile at 101.2. Pain has been constant and worsening.  Denies bloody stools or emeisis. LMP 9/10

## 2015-06-19 NOTE — ED Provider Notes (Addendum)
CSN: [id number]     Arrival date & time 06/19/15  2297 History  This chart was scribed for Evelina Bucy, MD by Julien Nordmann, ED Scribe. This patient was seen in room MH07/MH07 and the patient's care was started at 7:32 PM.     Chief Complaint  Patient presents with  . Abdominal Pain      Patient is a 24 y.o. female presenting with abdominal pain. The history is provided by the patient. No language interpreter was used.  Abdominal Pain Pain location:  RLQ Pain quality: aching and sharp   Pain radiates to:  L flank Pain severity:  Moderate Onset quality:  Gradual Duration:  1 week Timing:  Constant Progression:  Worsening Chronicity:  Recurrent Context: not recent illness   Relieved by:  Nothing Worsened by:  Eating Associated symptoms: diarrhea (non-bloody), fever (today) and vomiting (intermittent, every other day)   Associated symptoms: no cough and no shortness of breath    HPI Comments: TRUTH WOLAVER is a 24 y.o. female who has a hx of Crohn's disease presents to the Emergency Department complaining of intermittent, gradual worsening lower abdominal pain onset one week ago. She has an associated fever, vomiting, and diarrhea. She notes that the pain is sharp and radiates across her abdomen. She has been using soft pillows and heating pads to alleviate the pain with no relief. She notes that eating makes the pain worse. Pt denies vaginal discharge, vaginal pain, blood diarrhea, recent travel, and recent antibiotics. She states her last menstrual cycle was 9/10.  Past Medical History  Diagnosis Date  . Ovarian cyst   . Anemia   . Monocytosis 10/21/2013  . Crohn's disease    Past Surgical History  Procedure Laterality Date  . No past surgeries    . Colonoscopy N/A 10/23/2013    Procedure: COLONOSCOPY;  Surgeon: Missy Sabins, MD;  Location: [facility];  Service: Endoscopy;  Laterality: N/A;   Family History  Problem Relation Age of Onset  . Diabetes Mother   .  Hyperlipidemia Mother   . Diabetes Maternal Grandmother   . Hyperlipidemia Maternal Grandmother   . Birth defects Paternal Grandmother     Breast Cancer   Social History  Substance Use Topics  . Smoking status: Never Smoker   . Smokeless tobacco: Never Used  . Alcohol Use: No   OB History    No data available     Review of Systems  Constitutional: Positive for fever (today).  Respiratory: Negative for cough and shortness of breath.   Gastrointestinal: Positive for vomiting (intermittent, every other day), abdominal pain and diarrhea (non-bloody).  All other systems reviewed and are negative.     Allergies  Benadryl and Penicillins  Home Medications   Prior to Admission medications   Medication Sig Start Date End Date Taking? Authorizing Provider  ferrous sulfate 325 (65 FE) MG tablet Take 1 tablet (325 mg total) by mouth 2 (two) times daily with a meal. 10/24/13   Orson Eva, MD  ferrous sulfate 325 (65 FE) MG tablet Take 1 tablet (325 mg total) by mouth 2 (two) times daily with a meal. 02/04/15   Al Corpus, PA-C  HYDROcodone-acetaminophen (NORCO/VICODIN) 5-325 MG per tablet Take 1-2 tablets every 6 hours as needed for severe pain 07/27/14   Brunetta Jeans, PA-C  HYDROcodone-acetaminophen (NORCO/VICODIN) 5-325 MG per tablet Take 1-2 tablets by mouth every 6 (six) hours as needed (for pain). 08/28/14   Shanon Rosser, MD  Multiple Vitamin (MULTIVITAMIN  WITH MINERALS) TABS tablet Take 1 tablet by mouth every morning.    Historical Provider, MD  ondansetron (ZOFRAN) 4 MG tablet Take 1 tablet (4 mg total) by mouth every 6 (six) hours. 05/12/14   Glendell Docker, NP  predniSONE (DELTASONE) 10 MG tablet Take 3 tablets (30 mg total) by mouth daily with breakfast. 08/24/14   Brunetta Jeans, PA-C   Triage vitals: BP 136/81 mmHg  Pulse 112  Temp(Src) 101.2 F (38.4 C) (Oral)  Resp 20  Ht 5\' 3"  (1.6 m)  Wt 110 lb 5 oz (50.037 kg)  BMI 19.55 kg/m2  SpO2 100%  LMP 06/11/2015  (Exact Date) Physical Exam  Constitutional: She is oriented to person, place, and time. She appears well-developed and well-nourished. No distress.  HENT:  Head: Normocephalic and atraumatic.  Mouth/Throat: Oropharynx is clear and moist.  Eyes: EOM are normal. Pupils are equal, round, and reactive to light.  Neck: Normal range of motion. Neck supple.  Cardiovascular: Normal rate and regular rhythm.  Exam reveals no friction rub.   No murmur heard. Pulmonary/Chest: Effort normal and breath sounds normal. No respiratory distress. She has no wheezes. She has no rales.  Abdominal: Soft. She exhibits no distension. There is tenderness (RLQ, LLQ). There is guarding (RLQ). There is no rebound.  Musculoskeletal: Normal range of motion. She exhibits no edema.  Neurological: She is alert and oriented to person, place, and time.  Skin: She is not diaphoretic.  Nursing note and vitals reviewed.   ED Course  Procedures  DIAGNOSTIC STUDIES: Oxygen Saturation is 100% on RA, normal by my interpretation.  COORDINATION OF CARE: 7:34 PM Discussed treatment plan which includes scan abdomen and pain medication with pt at bedside and pt agreed to plan.  Labs Review Labs Reviewed  URINALYSIS, ROUTINE W REFLEX MICROSCOPIC (NOT AT [facility]) - Abnormal; Notable for the following:    Color, Urine AMBER (*)    APPearance CLOUDY (*)    Specific Gravity, Urine 1.036 (*)    Hgb urine dipstick MODERATE (*)    Bilirubin Urine SMALL (*)    Ketones, ur 40 (*)    Protein, ur 30 (*)    Leukocytes, UA MODERATE (*)    All other components within normal limits  CBC - Abnormal; Notable for the following:    RBC 3.51 (*)    Hemoglobin 7.6 (*)    HCT 25.4 (*)    MCV 72.4 (*)    MCH 21.7 (*)    MCHC 29.9 (*)    RDW 18.3 (*)    Platelets 692 (*)    All other components within normal limits  COMPREHENSIVE METABOLIC PANEL - Abnormal; Notable for the following:    Calcium 8.3 (*)    Albumin 2.5 (*)    AST 12 (*)     ALT 10 (*)    All other components within normal limits  LIPASE, BLOOD - Abnormal; Notable for the following:    Lipase 18 (*)    All other components within normal limits  SEDIMENTATION RATE - Abnormal; Notable for the following:    Sed Rate 80 (*)    All other components within normal limits  URINE MICROSCOPIC-ADD ON - Abnormal; Notable for the following:    Squamous Epithelial / LPF MANY (*)    Bacteria, UA MANY (*)    All other components within normal limits  PREGNANCY, URINE    Imaging Review Ct Abdomen Pelvis W Contrast  06/19/2015   CLINICAL DATA:  Right  lower quadrant pain for 1 week. Fever, vomiting, and diarrhea. Pain across the abdomen. Eating makes the pain worse. History of Crohn disease and ovarian cyst.  EXAM: CT ABDOMEN AND PELVIS WITH CONTRAST  TECHNIQUE: Multidetector CT imaging of the abdomen and pelvis was performed using the standard protocol following bolus administration of intravenous contrast.  CONTRAST:  65mL OMNIPAQUE IOHEXOL 300 MG/ML SOLN, 25mL OMNIPAQUE IOHEXOL 300 MG/ML SOLN  COMPARISON:  04/18/2015  FINDINGS: Lung bases are clear.  Focal fatty infiltration adjacent to the falciform ligament of the liver. Gallbladder, spleen, pancreas, adrenal glands, abdominal aorta, inferior vena cava, and retroperitoneal lymph nodes are unremarkable. Calcifications in both kidneys consistent with stones. Largest is in the right upper pole and measures 3 mm diameter this stone is unchanged in position since previous study. No hydronephrosis or hydroureter. Renal nephrograms are symmetrical. Stomach, small bowel, and colon are not abnormally distended. There is diffuse thickening of the wall of the pelvic small bowel likely involving mid and distal ileum. A similar appearance was present previously in this is likely due to inflammatory bowel disease consistent with history of Crohn's. Mild infiltration in the adjacent mesenteric. No abscess or fistula is identified. No proximal  obstruction. No free air or free fluid in the abdomen.  Pelvis: Appendix is normal. Uterus and ovaries are not enlarged. No free or loculated pelvic fluid collections. No pelvic mass or lymphadenopathy. No destructive bone lesions.  IMPRESSION: Inflammatory thickening in the wall of the distal small bowel, predominantly in the mid and distal ileum consistent with history of Crohn's disease. This likely suggest active disease. No fistula or abscess is identified. Appearance is similar to prior study. Nonobstructing intrarenal stones.   Electronically Signed   By: Lucienne Capers M.D.   On: 06/19/2015 21:29   I have personally reviewed and evaluated these images and lab results as part of my medical decision-making.   EKG Interpretation None      MDM   Final diagnoses:  Crohn's disease involving terminal ileum  Exacerbation of Crohn's disease, without complications    24 year old female here with right lower quadrant pain for the past week. Intermittent, has had some occasional vomiting. She's had some diarrhea also without blood. She is running a fever today, but has not had fever for the rest of the week. History of Crohn's, she is [facility]. She denies any urinary or vaginal symptoms. Here febrile, tachycardic, normotensive. She has right lower quadrant pain with some guarding. He also some other lower abdominal pain without guarding. No upper abdominal pain. We'll scan her abdomen. She does have Crohn's in her ileum and has not had an appendectomy, this could be either Crohn's or appendicitis.  CT shows a Crohn's flare. I spoke with Dr. Selinda Michaels of [facility] and since she is looking well clinically and does not want to stay, he recommended 20 mg of prednisone daily and f/u with Dr. Amedeo Plenty. She is amenable to this plan.  Will treat for UTI since she's having a fever.  Given vicodin, zofran, prednisone. Stable for discharge.  I personally performed the  services described in this documentation, which was scribed in my presence. The recorded information has been reviewed and is accurate.    Evelina Bucy, MD 06/19/15 [id number]  Evelina Bucy, MD 06/19/15 2218

## 2015-06-19 NOTE — ED Notes (Signed)
Pt alert, NAD, calm, interactive, resps e/u, speaking in clear complete sentences, steady gait, admits to nvd, vomited yesterday x1, diarrhea x4 , "feels like usual crohns", "did not know she had a fever", Dr. Mingo Amber at [facility].

## 2015-06-19 NOTE — ED Notes (Signed)
Pt drinking contrast, tolerating well, pain and nausea med given, IVF bolus infusing, friend at [facility], CT to be done in 90 minutes. Pt updated.

## 2015-06-19 NOTE — Discharge Instructions (Signed)
Colitis °Colitis is inflammation of the colon. Colitis can be a short-term or long-standing (chronic) illness. Crohn's disease and ulcerative colitis are 2 types of colitis which are chronic. They usually require lifelong treatment. °CAUSES  °There are many different causes of colitis, including: °· Viruses. °· Germs (bacteria). °· Medicine reactions. °SYMPTOMS  °· Diarrhea. °· Intestinal bleeding. °· Pain. °· Fever. °· Throwing up (vomiting). °· Tiredness (fatigue). °· Weight loss. °· Bowel blockage. °DIAGNOSIS  °The diagnosis of colitis is based on examination and stool or blood tests. X-rays, CT scan, and colonoscopy may also be needed. °TREATMENT  °Treatment may include: °· Fluids given through the vein (intravenously). °· Bowel rest (nothing to eat or drink for a period of time). °· Medicine for pain and diarrhea. °· Medicines (antibiotics) that kill germs. °· Cortisone medicines. °· Surgery. °HOME CARE INSTRUCTIONS  °· Get plenty of rest. °· Drink enough water and fluids to keep your urine clear or pale yellow. °· Eat a well-balanced diet. °· Call your caregiver for follow-up as recommended. °SEEK IMMEDIATE MEDICAL CARE IF:  °· You develop chills. °· You have an oral temperature above 102° F (38.9° C), not controlled by medicine. °· You have extreme weakness, fainting, or dehydration. °· You have repeated vomiting. °· You develop severe belly (abdominal) pain or are passing bloody or tarry stools. °MAKE SURE YOU:  °· Understand these instructions. °· Will watch your condition. °· Will get help right away if you are not doing well or get worse. °Document Released: 10/25/2004 Document Revised: 12/10/2011 Document Reviewed: 01/20/2010 °ExitCare® Patient Information ©2015 [employer]. This information is not intended to replace advice given to you by your health care provider. Make sure you discuss any questions you have with your health care provider. ° °Crohn Disease °Crohn disease is a long-term (chronic)  soreness and redness (inflammation) of the intestines (bowel). It can affect any portion of the digestive tract, from the mouth to the anus. It can also cause problems outside the digestive tract. Crohn disease is closely related to a disease called ulcerative colitis (together, these two diseases are called inflammatory bowel disease).  °CAUSES  °The cause of Crohn disease is not known. One theory is that, in an easily affected person, the immune system is triggered to attack the body's own digestive tissue. Crohn disease runs in families. It seems to be more common in certain geographic areas and amongst certain races. There are no clear-cut dietary causes.  °SYMPTOMS  °Crohn disease can cause many different symptoms since it can affect many different parts of the body. Symptoms include: °· Fatigue. °· Weight loss. °· Chronic diarrhea, sometime bloody. °· Abdominal pain and cramps. °· Fever. °· Ulcers or canker sores in the mouth or rectum. °· Anemia (low red blood cells). °· Arthritis, skin problems, and eye problems may occur. °Complications of Crohn disease can include: °· Series of holes (perforation) of the bowel. °· Portions of the intestines sticking to each other (adhesions). °· Obstruction of the bowel. °· Fistula formation, typically in the rectal area but also in other areas. A fistula is an opening between the bowels and the outside, or between the bowels and another organ. °· A painful crack in the mucous membrane of the anus (rectal fissure). °DIAGNOSIS  °Your caregiver may suspect Crohn disease based on your symptoms and an exam. Blood tests may confirm that there is a problem. You may be asked to submit a stool specimen for examination. X-rays and CT scans may be necessary.   Ultimately, the diagnosis is usually made after a procedure that uses a flexible tube that is inserted via your mouth or your anus. This is done under sedation and is called either an upper endoscopy or colonoscopy. With these  tests, the specialist can take tiny tissue samples and remove them from the inside of the bowel (biopsy). Examination of this biopsy tissue under a microscope can reveal Crohn disease as the cause of your symptoms. °Due to the many different forms that Crohn disease can take, symptoms may be present for several years before a diagnosis is made. °TREATMENT  °Medications are often used to decrease inflammation and control the immune system. These include medicines related to aspirin, steroid medications, and newer and stronger medications to slow down the immune system. Some medications may be used as suppositories or enemas. A number of other medications are used or have been studied. Your caregiver will make specific recommendations. °HOME CARE INSTRUCTIONS  °· Symptoms such as diarrhea can be controlled with medications. Avoid foods that have a laxative effect such as fresh fruit, vegetables, and dairy products. During flare-ups, you can rest your bowel by refraining from solid foods. Drink clear liquids frequently during the day. (Electrolyte or rehydrating fluids are best. Your caregiver can help you with suggestions.) Drink often to prevent loss of body fluids (dehydration). When diarrhea has cleared, eat small meals and more frequently. Avoid food additives and stimulants such as caffeine (coffee, tea, or chocolate). Enzyme supplements may help if you develop intolerance to a sugar in dairy products (lactose). Ask your caregiver or dietitian about specific dietary instructions. °· Try to maintain a positive attitude. Learn relaxation techniques such as self-hypnosis, mental imaging, and muscle relaxation. °· If possible, avoid stresses which can aggravate your condition. °· Exercise regularly. °· Follow your diet. °· Always get plenty of rest. °SEEK MEDICAL CARE IF:  °· Your symptoms fail to improve after a week or two of new treatment. °· You experience continued weight loss. °· You have ongoing cramps or  loose bowels. °· You develop a new skin rash, skin sores, or eye problems. °SEEK IMMEDIATE MEDICAL CARE IF:  °· You have worsening of your symptoms or develop new symptoms. °· You have a fever. °· You develop bloody diarrhea. °· You develop severe abdominal pain. °MAKE SURE YOU:  °· Understand these instructions. °· Will watch your condition. °· Will get help right away if you are not doing well or get worse. °Document Released: 06/27/2005 Document Revised: 02/01/2014 Document Reviewed: 05/26/2007 °ExitCare® Patient Information ©2015 [employer]. This information is not intended to replace advice given to you by your health care provider. Make sure you discuss any questions you have with your health care provider. ° °

## 2019-05-06 ENCOUNTER — Encounter (HOSPITAL_BASED_OUTPATIENT_CLINIC_OR_DEPARTMENT_OTHER): Payer: Self-pay

## 2019-05-06 ENCOUNTER — Other Ambulatory Visit: Payer: Self-pay

## 2019-05-06 ENCOUNTER — Emergency Department (HOSPITAL_BASED_OUTPATIENT_CLINIC_OR_DEPARTMENT_OTHER)
Admission: EM | Admit: 2019-05-06 | Discharge: 2019-05-06 | Disposition: A | Discharge status: Home / Self Care | Payer: BLUE CROSS/BLUE SHIELD | Attending: Emergency Medicine | Admitting: Emergency Medicine

## 2019-05-06 DIAGNOSIS — R109 Unspecified abdominal pain: Secondary | ICD-10-CM | POA: Diagnosis present

## 2019-05-06 DIAGNOSIS — N200 Calculus of kidney: Secondary | ICD-10-CM | POA: Insufficient documentation

## 2019-05-06 DIAGNOSIS — K50119 Crohn's disease of large intestine with unspecified complications: Secondary | ICD-10-CM | POA: Diagnosis not present

## 2019-05-06 DIAGNOSIS — Z79899 Other long term (current) drug therapy: Secondary | ICD-10-CM | POA: Insufficient documentation

## 2019-05-06 LAB — URINALYSIS, ROUTINE W REFLEX MICROSCOPIC
Bilirubin Urine: NEGATIVE
Glucose, UA: NEGATIVE mg/dL
Ketones, ur: NEGATIVE mg/dL
Leukocytes,Ua: NEGATIVE
Nitrite: NEGATIVE
Protein, ur: 100 mg/dL — AB
Specific Gravity, Urine: 1.01 (ref 1.005–1.030)
pH: 6.5 (ref 5.0–8.0)

## 2019-05-06 LAB — CBC WITH DIFFERENTIAL/PLATELET
Abs Immature Granulocytes: 0.02 10*3/uL (ref 0.00–0.07)
Basophils Absolute: 0 10*3/uL (ref 0.0–0.1)
Basophils Relative: 1 %
Eosinophils Absolute: 0.2 10*3/uL (ref 0.0–0.5)
Eosinophils Relative: 2 %
HCT: 33.9 % — ABNORMAL LOW (ref 36.0–46.0)
Hemoglobin: 10.7 g/dL — ABNORMAL LOW (ref 12.0–15.0)
Immature Granulocytes: 0 %
Lymphocytes Relative: 18 %
Lymphs Abs: 1.4 10*3/uL (ref 0.7–4.0)
MCH: 27.3 pg (ref 26.0–34.0)
MCHC: 31.6 g/dL (ref 30.0–36.0)
MCV: 86.5 fL (ref 80.0–100.0)
Monocytes Absolute: 0.9 10*3/uL (ref 0.1–1.0)
Monocytes Relative: 12 %
Neutro Abs: 5 10*3/uL (ref 1.7–7.7)
Neutrophils Relative %: 67 %
Platelets: 492 10*3/uL — ABNORMAL HIGH (ref 150–400)
RBC: 3.92 MIL/uL (ref 3.87–5.11)
RDW: 14.1 % (ref 11.5–15.5)
WBC: 7.5 10*3/uL (ref 4.0–10.5)
nRBC: 0 % (ref 0.0–0.2)

## 2019-05-06 LAB — BASIC METABOLIC PANEL
Anion gap: 9 (ref 5–15)
BUN: 9 mg/dL (ref 6–20)
CO2: 22 mmol/L (ref 22–32)
Calcium: 8.8 mg/dL — ABNORMAL LOW (ref 8.9–10.3)
Chloride: 103 mmol/L (ref 98–111)
Creatinine, Ser: 0.81 mg/dL (ref 0.44–1.00)
GFR calc Af Amer: >60 mL/min (ref >60)
GFR calc non Af Amer: >60 mL/min (ref >60)
Glucose, Bld: 86 mg/dL (ref 70–99)
Potassium: 4 mmol/L (ref 3.5–5.1)
Sodium: 134 mmol/L — ABNORMAL LOW (ref 135–145)

## 2019-05-06 LAB — PREGNANCY, URINE: Preg Test, Ur: NEGATIVE

## 2019-05-06 LAB — URINALYSIS, MICROSCOPIC (REFLEX)

## 2019-05-06 MED ORDER — MORPHINE SULFATE (PF) 4 MG/ML IV SOLN
4.0000 mg | Freq: Once | INTRAVENOUS | Status: AC
  Administered 2019-05-06: 19:00:00 4 mg via INTRAVENOUS
  Filled 2019-05-06: qty 1

## 2019-05-06 MED ORDER — ONDANSETRON 4 MG PO TBDP: 4.0000 mg | ORAL_TABLET | Freq: Three times a day (TID) | ORAL | 0 refills | Status: DC | PRN

## 2019-05-06 MED ORDER — ONDANSETRON HCL 4 MG/2ML IJ SOLN
4.0000 mg | Freq: Once | INTRAMUSCULAR | Status: AC
  Administered 2019-05-06: 19:00:00 4 mg via INTRAVENOUS
  Filled 2019-05-06: qty 2

## 2019-05-06 MED ORDER — TAMSULOSIN HCL 0.4 MG PO CAPS: 0.4000 mg | ORAL_CAPSULE | Freq: Every day | ORAL | 0 refills | Status: AC

## 2019-05-06 MED ORDER — HYDROMORPHONE HCL 1 MG/ML IJ SOLN
0.5000 mg | Freq: Once | INTRAMUSCULAR | Status: AC
  Administered 2019-05-06: 20:00:00 0.5 mg via INTRAVENOUS
  Filled 2019-05-06: qty 1

## 2019-05-06 MED ORDER — SODIUM CHLORIDE 0.9 % IV BOLUS
1000.0000 mL | Freq: Once | INTRAVENOUS | Status: AC
  Administered 2019-05-06: 19:00:00 1000 mL via INTRAVENOUS

## 2019-05-06 MED ORDER — HYDROCODONE-ACETAMINOPHEN 5-325 MG PO TABS: 1.0000 | ORAL_TABLET | Freq: Three times a day (TID) | ORAL | 0 refills | Status: DC | PRN

## 2019-05-06 NOTE — Discharge Instructions (Signed)
You can take 1000 mg of Tylenol.  Do not exceed 4000 mg of Tylenol a day.  Take pain medications as directed for break through pain. Do not drive or operate machinery while taking this medication.   Take zofran for nausea.  Take flomax to help with passing the kidney stone.   Please follow-up with referred urology.  Turn the emergency department for any worsening pain, fevers, vomiting or any other worsening or concerning symptoms.

## 2019-05-06 NOTE — ED Provider Notes (Signed)
[facility] EMERGENCY DEPARTMENT Provider Note   CSN: [id number] Arrival date & time: 05/06/19  1659    History   Chief Complaint Chief Complaint  Patient presents with  . Abdominal Pain    HPI Beverly Richards is a 28 y.o. female past medical history of anemia, Crohn's who presents for evaluation of known kidney stone.  Patient reports that she was sent over from urgent care for evaluation.  She reports that about 4 days, she has had intermittent right sided abdominal pain.  She initially thought this was a Crohn's flare and states that it felt similar but then she stated that the pain started becoming more intense in the right lower quadrant and radiating to her back.  She states she has not noted any dysuria or hematuria.  She has had some nausea but denies any vomiting.  She has not had fevers.  She reports that she went to urgent care earlier today for evaluation where she was found to have a kidney stone.  She was referred to the emergency department for further evaluation.  Patient denies any chest pain, difficulty breathing.  She is currently followed by [facility] for her Crohn's.  She is on Remicade and her next infusion is in 2 days.       The history is provided by the patient.    Past Medical History:  Diagnosis Date  . Anemia   . Crohn's disease ([place])   . Monocytosis 10/21/2013  . Ovarian cyst     Patient Active Problem List   Diagnosis Date Noted  . Anemia, iron deficiency 09/14/2014  . Crohn disease ([place]) 07/27/2014  . 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 Protein-calorie malnutrition, severe ([place]) 10/22/2013  . Anemia 10/21/2013  . Abdominal pain 10/21/2013  . Leukopenia 10/21/2013  . Monocytosis 10/21/2013    Past Surgical History:  Procedure Laterality Date  . COLONOSCOPY N/A 10/23/2013   Procedure: COLONOSCOPY;  Surgeon: Missy Sabins, MD;  Location: [facility];  Service: Endoscopy;  Laterality: N/A;  . NO PAST SURGERIES    .  TONSILLECTOMY       OB History   No obstetric history on file.      Home Medications    Prior to Admission medications   Medication Sig Start Date End Date Taking? Authorizing Provider  ciprofloxacin (CIPRO) 500 MG tablet Take 1 tablet (500 mg total) by mouth every 12 (twelve) hours. 06/19/15   Evelina Bucy, MD  ferrous sulfate 325 (65 FE) MG tablet Take 1 tablet (325 mg total) by mouth 2 (two) times daily with a meal. 10/24/13   Tat, Shanon Brow, MD  ferrous sulfate 325 (65 FE) MG tablet Take 1 tablet (325 mg total) by mouth 2 (two) times daily with a meal. 02/04/15   Daniel Nones, [place], PA-C  HYDROcodone-acetaminophen (NORCO/VICODIN) 5-325 MG tablet Take 1-2 tablets by mouth every 8 (eight) hours as needed. 05/06/19   Volanda Napoleon, PA-C  Multiple Vitamin (MULTIVITAMIN WITH MINERALS) TABS tablet Take 1 tablet by mouth every morning.    [provider]  ondansetron (ZOFRAN ODT) 4 MG disintegrating tablet Take 1 tablet (4 mg total) by mouth every 8 (eight) hours as needed for nausea or vomiting. 05/06/19   Providence Lanius A, PA-C  ondansetron (ZOFRAN) 4 MG tablet Take 1 tablet (4 mg total) by mouth every 6 (six) hours. 05/12/14   Glendell Docker, NP  predniSONE (DELTASONE) 10 MG tablet Take 3 tablets (30 mg total) by mouth daily with  breakfast. 08/24/14   Brunetta Jeans, PA-C  predniSONE (DELTASONE) 20 MG tablet 1 tab daily 06/19/15   Evelina Bucy, MD  tamsulosin (FLOMAX) 0.4 MG CAPS capsule Take 1 capsule (0.4 mg total) by mouth daily for 5 days. 05/06/19 05/11/19  Volanda Napoleon, PA-C    Family History Family History  Problem Relation Age of Onset  . Diabetes Mother   . Hyperlipidemia Mother   . Diabetes Maternal Grandmother   . Hyperlipidemia Maternal Grandmother   . Birth defects Paternal Grandmother        Breast Cancer    Social History Social History   Tobacco Use  . Smoking status: Never Smoker  . Smokeless tobacco: Never Used  Substance Use Topics  . Alcohol use:  No  . Drug use: No     Allergies   Benadryl [diphenhydramine hcl] and Penicillins   Review of Systems Review of Systems  Constitutional: Negative for fever.  Respiratory: Negative for cough and shortness of breath.   Cardiovascular: Negative for chest pain.  Gastrointestinal: Positive for abdominal pain and nausea. Negative for vomiting.  Genitourinary: Positive for flank pain. Negative for dysuria and hematuria.  Neurological: Negative for headaches.  All other systems reviewed and are negative.    Physical Exam Updated Vital Signs BP 121/74 (BP Location: Right Arm)   Pulse 88   Temp 99 F (37.2 C) (Oral)   Resp 17   Ht 5\' 3"  (1.6 m)   Wt 63 kg   LMP 04/21/2019   SpO2 100%   BMI 24.62 kg/m   Physical Exam Vitals signs and nursing note reviewed.  Constitutional:      Appearance: Normal appearance. She is well-developed.  HENT:     Head: Normocephalic and atraumatic.  Eyes:     General: Lids are normal.     Conjunctiva/sclera: Conjunctivae normal.     Pupils: Pupils are equal, round, and reactive to light.  Neck:     Musculoskeletal: Full passive range of motion without pain.  Cardiovascular:     Rate and Rhythm: Normal rate and regular rhythm.     Pulses: Normal pulses.     Heart sounds: Normal heart sounds. No murmur. No friction rub. No gallop.   Pulmonary:     Effort: Pulmonary effort is normal.     Breath sounds: Normal breath sounds.     Comments: Lungs clear to auscultation bilaterally.  Symmetric chest rise.  No wheezing, rales, rhonchi. Abdominal:     Palpations: Abdomen is soft. Abdomen is not rigid.     Tenderness: There is abdominal tenderness in the right lower quadrant. There is right CVA tenderness. There is no guarding.     Comments: Diffuse tenderness noted to right lower quadrant.  No focal tenderness noted at McBurney's point.  Right-sided CVA tenderness noted.  Musculoskeletal: Normal range of motion.  Skin:    General: Skin is warm and  dry.     Capillary Refill: Capillary refill takes less than 2 seconds.  Neurological:     Mental Status: She is alert and oriented to person, place, and time.  Psychiatric:        Speech: Speech normal.      ED Treatments / Results  Labs (all labs ordered are listed, but only abnormal results are displayed) Labs Reviewed  CBC WITH DIFFERENTIAL/PLATELET - Abnormal; Notable for the following components:      Result Value   Hemoglobin 10.7 (*)    HCT 33.9 (*)  Platelets 492 (*)    All other components within normal limits  BASIC METABOLIC PANEL - Abnormal; Notable for the following components:   Sodium 134 (*)    Calcium 8.8 (*)    All other components within normal limits  URINALYSIS, ROUTINE W REFLEX MICROSCOPIC - Abnormal; Notable for the following components:   Hgb urine dipstick SMALL (*)    Protein, ur 100 (*)    All other components within normal limits  URINALYSIS, MICROSCOPIC (REFLEX) - Abnormal; Notable for the following components:   Bacteria, UA RARE (*)    All other components within normal limits  PREGNANCY, URINE    EKG None  Radiology No results found.  Procedures Procedures (including critical care time)  Medications Ordered in ED Medications  sodium chloride 0.9 % bolus 1,000 mL (0 mLs Intravenous Stopped 05/06/19 2147)  ondansetron (ZOFRAN) injection 4 mg (4 mg Intravenous Given 05/06/19 1840)  morphine 4 MG/ML injection 4 mg (4 mg Intravenous Given 05/06/19 1841)  HYDROmorphone (DILAUDID) injection 0.5 mg (0.5 mg Intravenous Given 05/06/19 1957)     Initial Impression / Assessment and Plan / ED Course  I have reviewed the triage vital signs and the nursing notes.  Pertinent labs & imaging results that were available during my care of the patient were reviewed by me and considered in my medical decision making (see chart for details).        28 y.o. F who presents for evaluation of a known kidney stone.  She was seen at urgent care and was  referred to the emergency department for further evaluation.  Reports some nausea but no urinary complaints.  No fevers.  On initial ED arrival, she is afebrile.  She is hypertensive but vitals otherwise stable.  On exam, she has diffuse lower quadrant abdominal tenderness as well as some CVA tenderness.  Patient was already confirmed kidney stone.  Plan to check labs, a repeat urine.  CT scan from outside office reviewed.  There is a distal right pelvic ureteral 5 mm stone with minimal right hydroureter nephrosis.  Additionally, she has some nonobstructing stones.  She also has some mural the wall thickening of the right UPJ and in the right mid pelvic ureter with some surrounding fat stranding.  Normal appendix.  She also has a long segment of wall thickening and mucosal hyperenhancement in the distal and terminal ileum compatible with active Crohn's ileitis.  No bowel obstruction, free air or abscess.  BMP is unremarkable.  Urine pregnancy is negative.  UA negative for any infection.  CBC shows no leukocytosis.  Hemoglobin stable at 10.7.  Reevaluation after initial analgesics.  Patient reports improvement in pain.  She still states pain is 5/10.  Will give her additional analgesics and reassess.  Evaluation.  Patient reports improvement of pain after second round of analgesics.  She is sitting up appears much more comfortable.  Repeat abdominal exam improved.  Patient states she feels comfortable going home.  Plan for urology follow-up.  She Artie has follow-up with her GI doctor.  Encouraged her to keep that appointment.  Will give short course of pain medication. At this time, patient exhibits no emergent life-threatening condition that require further evaluation in ED or admission. Patient had ample opportunity for questions and discussion. All patient's questions were answered with full understanding.  Strict return precautions discussed. Patient expresses understanding and agreement to plan.    Portions of this note were generated with [employer]. Dictation errors may occur despite  best attempts at proofreading.   Final Clinical Impressions(s) / ED Diagnoses   Final diagnoses:  Kidney stone  Crohn's disease of colon with complication [facility])    ED Discharge Orders         Ordered    ondansetron (ZOFRAN ODT) 4 MG disintegrating tablet  Every 8 hours PRN     05/06/19 2129    HYDROcodone-acetaminophen (NORCO/VICODIN) 5-325 MG tablet  Every 8 hours PRN     05/06/19 2129    tamsulosin (FLOMAX) 0.4 MG CAPS capsule  Daily     05/06/19 2129           Volanda Napoleon, PA-C 05/06/19 2223    Blanchie Dessert, MD 05/06/19 2348

## 2019-05-06 NOTE — ED Notes (Signed)
Pt discharged to home

## 2019-05-06 NOTE — ED Triage Notes (Signed)
Pt c/o right side abd pain x 3 days-nausea-denies v/d-pt seen and sent by [facility] (see notes)-NAD-steady gait

## 2019-08-17 ENCOUNTER — Emergency Department (HOSPITAL_BASED_OUTPATIENT_CLINIC_OR_DEPARTMENT_OTHER): Payer: BLUE CROSS/BLUE SHIELD

## 2019-08-17 ENCOUNTER — Other Ambulatory Visit: Payer: Self-pay

## 2019-08-17 ENCOUNTER — Encounter (HOSPITAL_BASED_OUTPATIENT_CLINIC_OR_DEPARTMENT_OTHER): Payer: Self-pay

## 2019-08-17 ENCOUNTER — Emergency Department (HOSPITAL_BASED_OUTPATIENT_CLINIC_OR_DEPARTMENT_OTHER)
Admission: EM | Admit: 2019-08-17 | Discharge: 2019-08-17 | Disposition: A | Discharge status: Home / Self Care | Payer: BLUE CROSS/BLUE SHIELD | Attending: Emergency Medicine | Admitting: Emergency Medicine

## 2019-08-17 DIAGNOSIS — I10 Essential (primary) hypertension: Secondary | ICD-10-CM | POA: Insufficient documentation

## 2019-08-17 DIAGNOSIS — Z880 Allergy status to penicillin: Secondary | ICD-10-CM | POA: Diagnosis not present

## 2019-08-17 DIAGNOSIS — Z79899 Other long term (current) drug therapy: Secondary | ICD-10-CM | POA: Diagnosis not present

## 2019-08-17 DIAGNOSIS — Z888 Allergy status to other drugs, medicaments and biological substances status: Secondary | ICD-10-CM | POA: Diagnosis not present

## 2019-08-17 DIAGNOSIS — R0602 Shortness of breath: Secondary | ICD-10-CM | POA: Diagnosis present

## 2019-08-17 LAB — CBC WITH DIFFERENTIAL/PLATELET
Abs Immature Granulocytes: 0.03 10*3/uL (ref 0.00–0.07)
Basophils Absolute: 0 10*3/uL (ref 0.0–0.1)
Basophils Relative: 1 %
Eosinophils Absolute: 0.3 10*3/uL (ref 0.0–0.5)
Eosinophils Relative: 3 %
HCT: 29.5 % — ABNORMAL LOW (ref 36.0–46.0)
Hemoglobin: 9.2 g/dL — ABNORMAL LOW (ref 12.0–15.0)
Immature Granulocytes: 0 %
Lymphocytes Relative: 28 %
Lymphs Abs: 2.1 10*3/uL (ref 0.7–4.0)
MCH: 27.3 pg (ref 26.0–34.0)
MCHC: 31.2 g/dL (ref 30.0–36.0)
MCV: 87.5 fL (ref 80.0–100.0)
Monocytes Absolute: 0.9 10*3/uL (ref 0.1–1.0)
Monocytes Relative: 12 %
Neutro Abs: 4.2 10*3/uL (ref 1.7–7.7)
Neutrophils Relative %: 56 %
Platelets: 372 10*3/uL (ref 150–400)
RBC: 3.37 MIL/uL — ABNORMAL LOW (ref 3.87–5.11)
RDW: 13.2 % (ref 11.5–15.5)
WBC: 7.5 10*3/uL (ref 4.0–10.5)
nRBC: 0 % (ref 0.0–0.2)

## 2019-08-17 LAB — BASIC METABOLIC PANEL
Anion gap: 8 (ref 5–15)
BUN: 11 mg/dL (ref 6–20)
CO2: 20 mmol/L — ABNORMAL LOW (ref 22–32)
Calcium: 8.5 mg/dL — ABNORMAL LOW (ref 8.9–10.3)
Chloride: 108 mmol/L (ref 98–111)
Creatinine, Ser: 0.81 mg/dL (ref 0.44–1.00)
GFR calc Af Amer: >60 mL/min (ref >60)
GFR calc non Af Amer: >60 mL/min (ref >60)
Glucose, Bld: 98 mg/dL (ref 70–99)
Potassium: 3.6 mmol/L (ref 3.5–5.1)
Sodium: 136 mmol/L (ref 135–145)

## 2019-08-17 LAB — D-DIMER, QUANTITATIVE: D-Dimer, Quant: 0.52 ug/mL-FEU — ABNORMAL HIGH (ref 0.00–0.50)

## 2019-08-17 LAB — PREGNANCY, URINE: Preg Test, Ur: NEGATIVE

## 2019-08-17 IMAGING — DX DG CHEST 2V
2 series · 2 of 2 positions shown · non-contrast
Comparison: [DATE]

CLINICAL DATA: Shortness of breath for 4 days. Mid upper chest
pain.

EXAM:
CHEST - 2 VIEW

[chest pa]
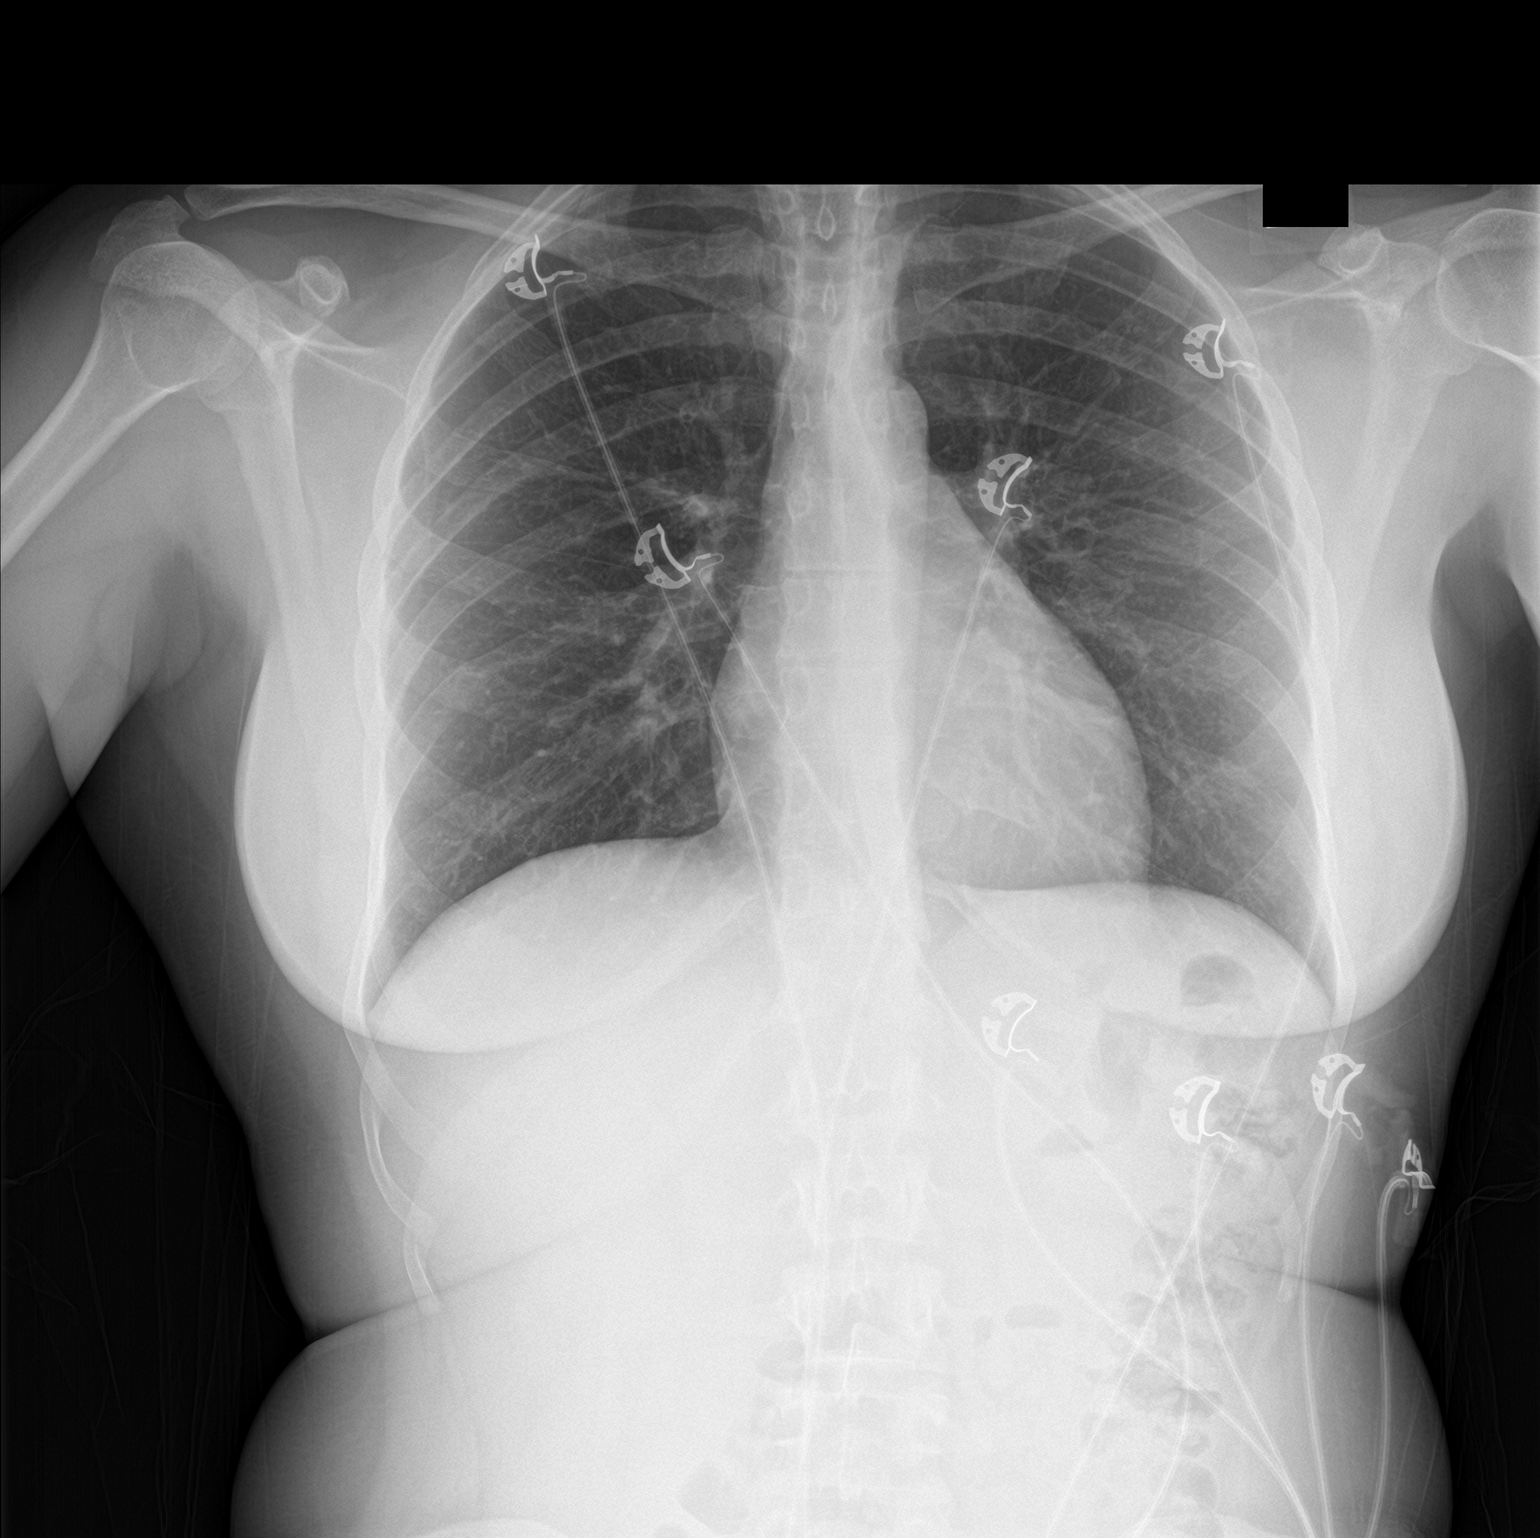

[chest lat]
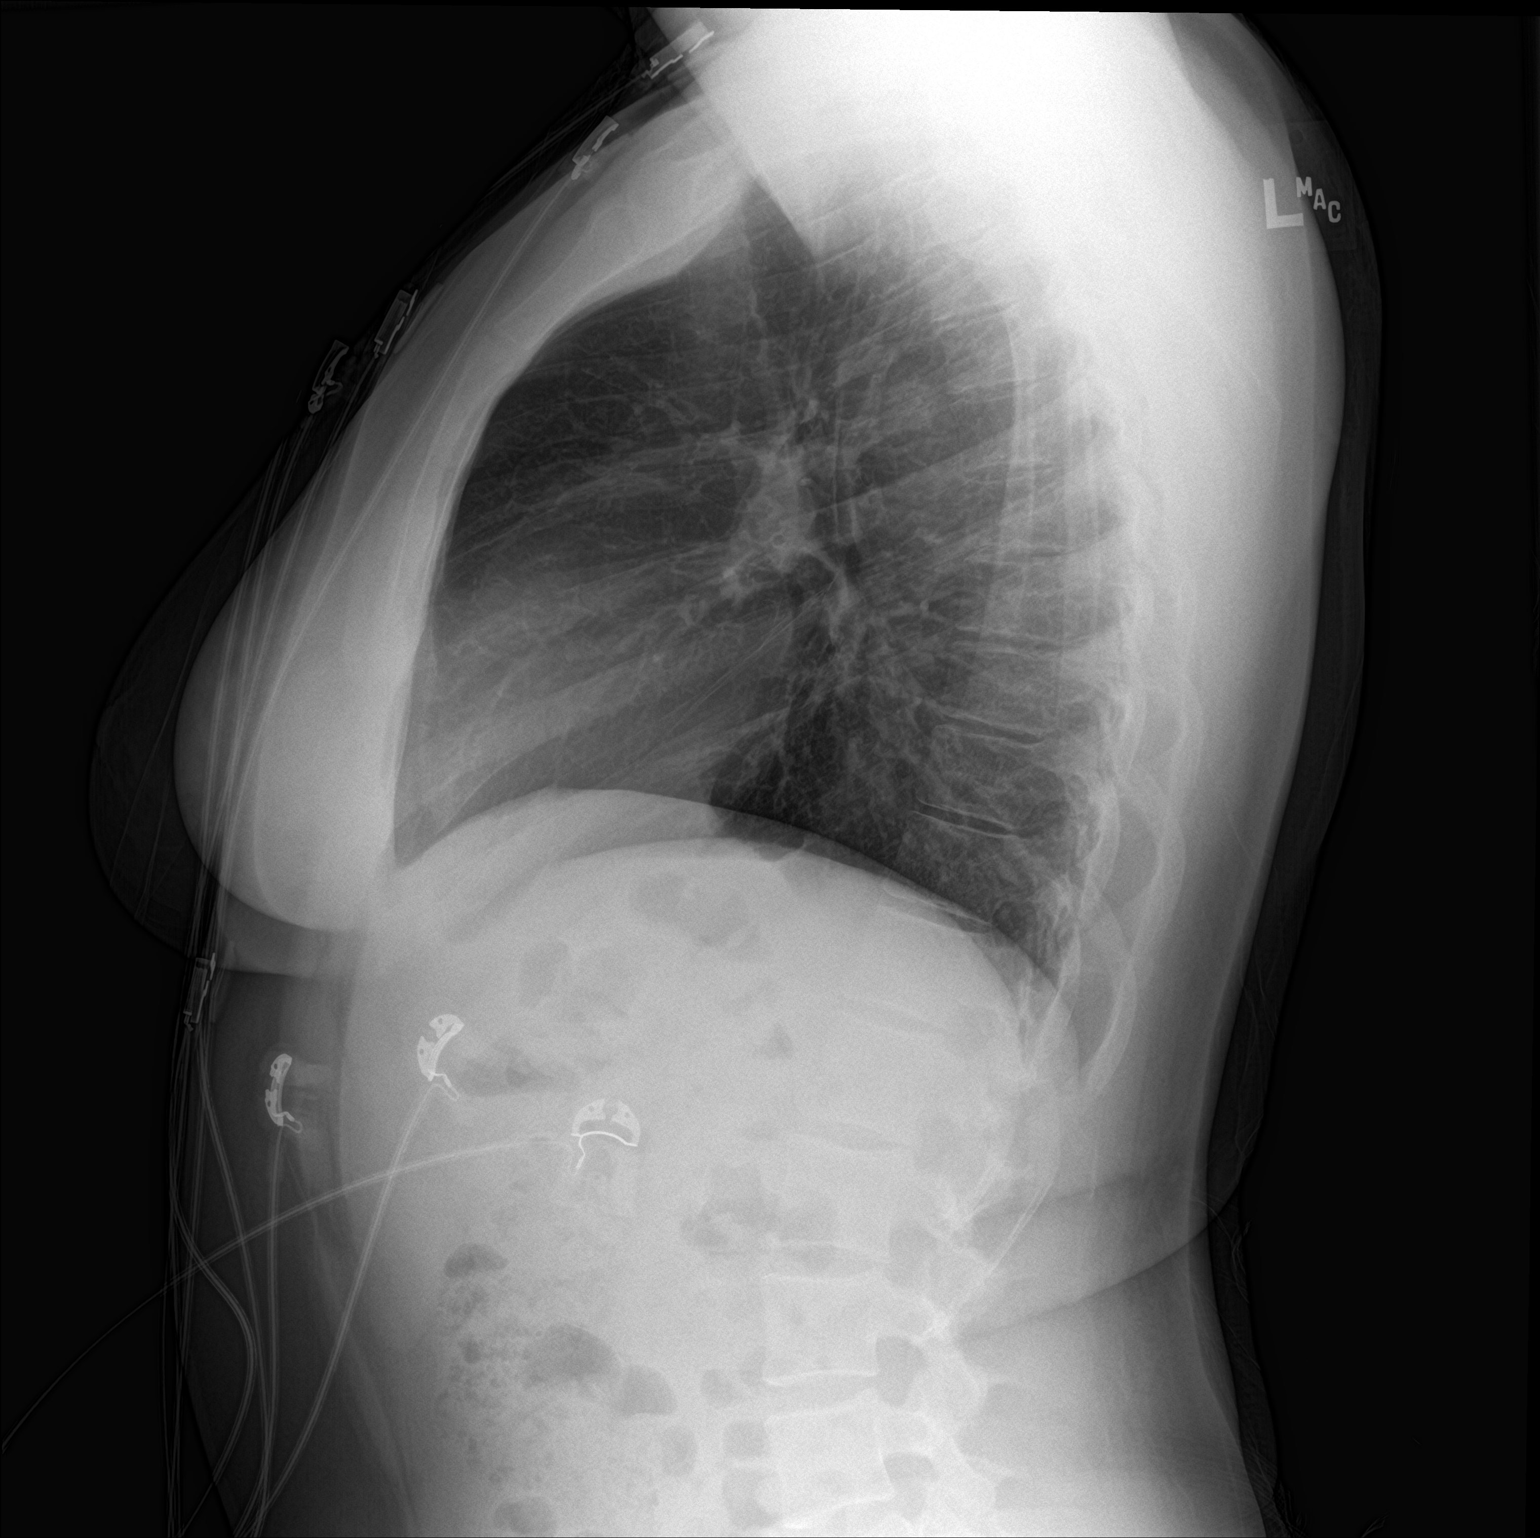

[2 of 2 positions shown; findings below may reference images not displayed]

FINDINGS: The cardiomediastinal contours are normal. The lungs are clear.
Pulmonary vasculature is normal. No consolidation, pleural effusion,
or pneumothorax. No acute osseous abnormalities are seen.
IMPRESSION: Normal radiographs of the chest.

## 2019-08-17 IMAGING — CT CT ANGIO CHEST
3 of 8 series · 14 of 36 positions shown · IV contrast (Omnipaque)
Comparison: None.

CLINICAL DATA: Chest pain for 4 days

EXAM:
CT ANGIOGRAPHY CHEST WITH CONTRAST
TECHNIQUE: Multidetector CT imaging of the chest was performed using the
standard protocol during bolus administration of intravenous
contrast. Multiplanar CT image reconstructions and MIPs were
obtained to evaluate the vascular anatomy.
CONTRAST:  100mL OMNIPAQUE IOHEXOL 350 MG/ML SOLN

[Series 4: pe axial st · axial · 0.56mm/px · z∈[-224,-62]mm · 5 of 82 slices shown]
[im 14/82  lung]
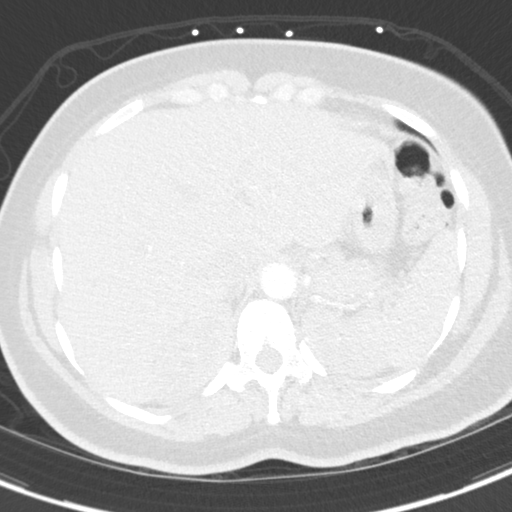
[im 28/82  mediastinal]
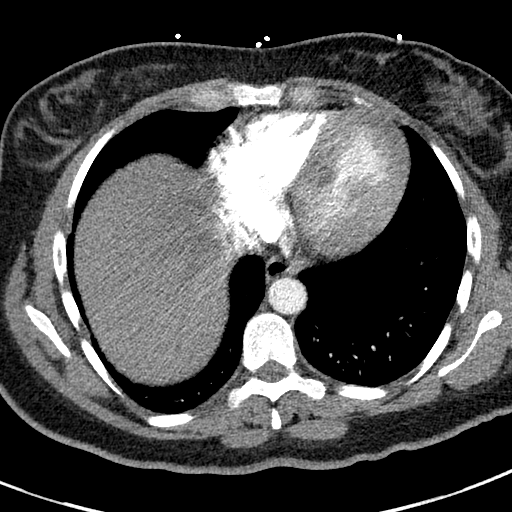
[im 41/82  lung]
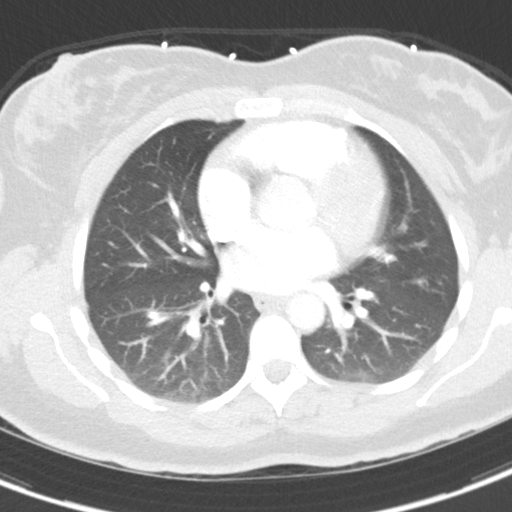
[im 55/82  mediastinal]
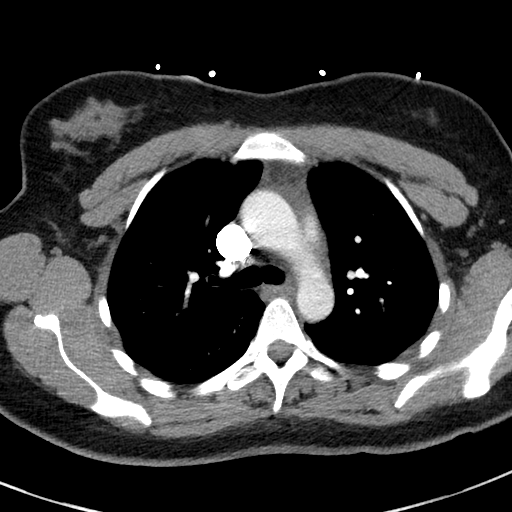
[im 68/82  lung]
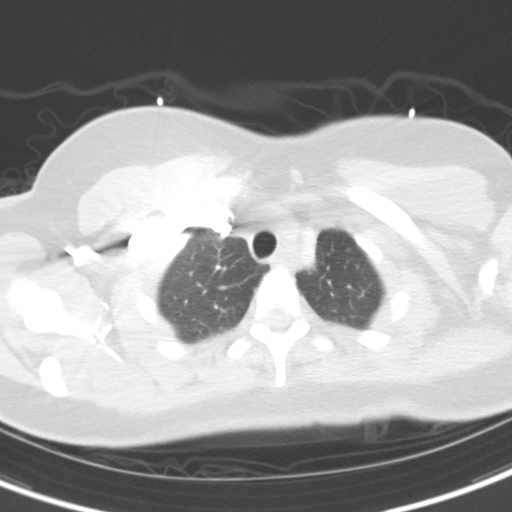

[Series 6: pe coronal mpr · coronal · 0.49mm/px · 1 of 116 slices shown]
[im 58/116  mediastinal]
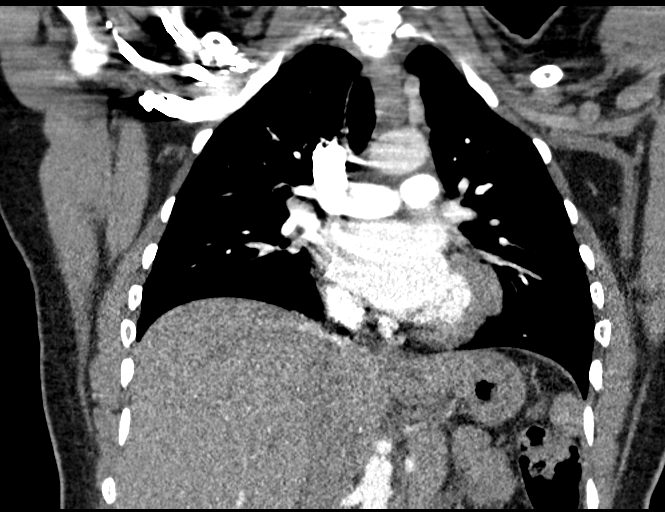

[Series 10: pe thins · axial · 0.56mm/px · z∈[-207,-44]mm · 8 of 211 slices shown]
[im 24/211  lung]
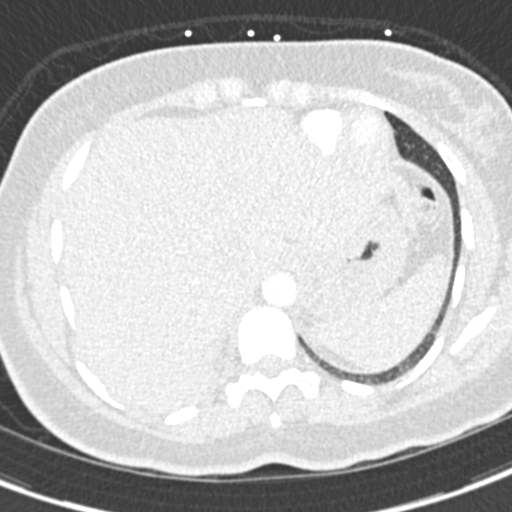
[im 47/211  lung]
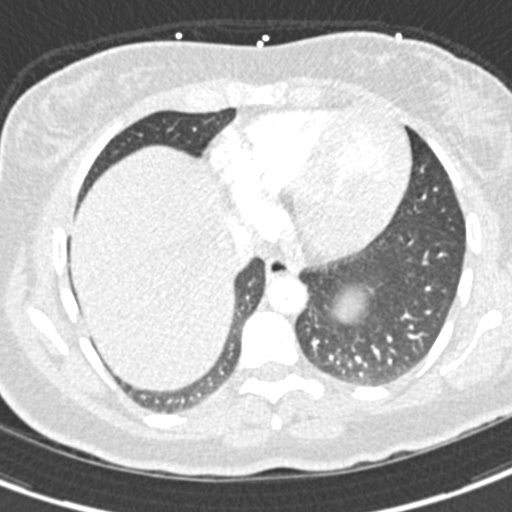
[im 71/211  lung]
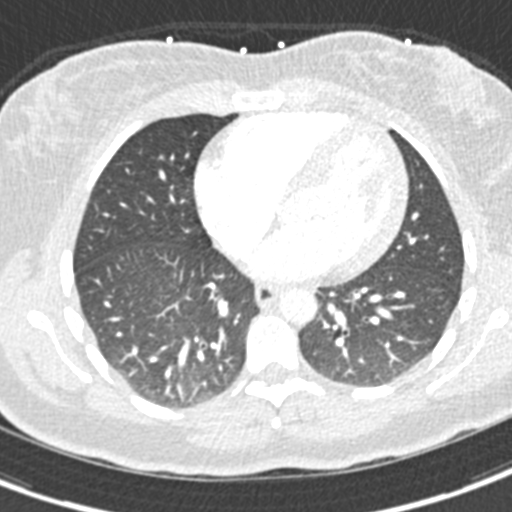
[im 94/211  lung]
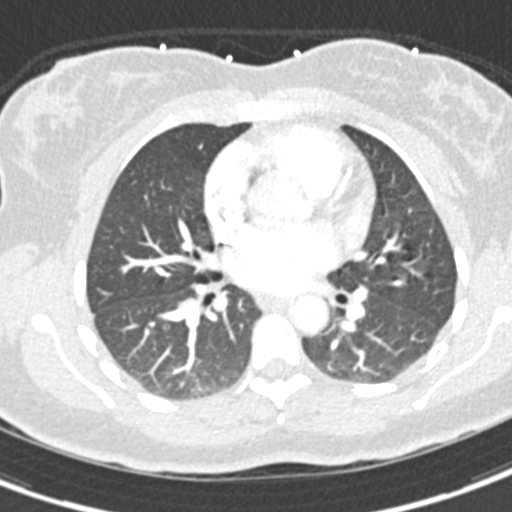
[im 117/211  lung]
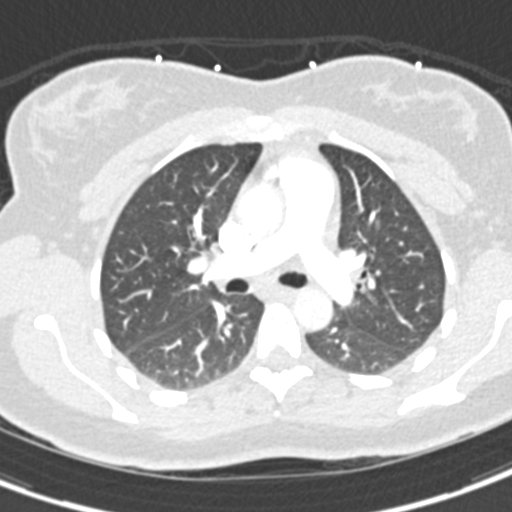
[im 141/211  lung]
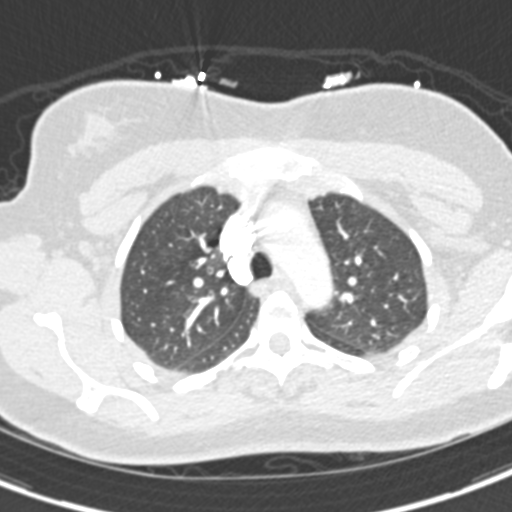
[im 164/211  lung]
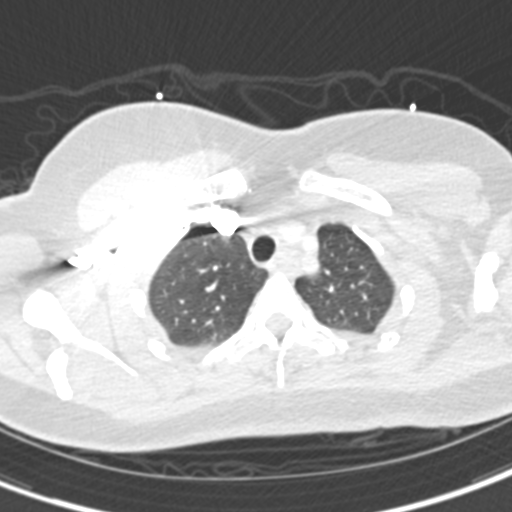
[im 187/211  lung]
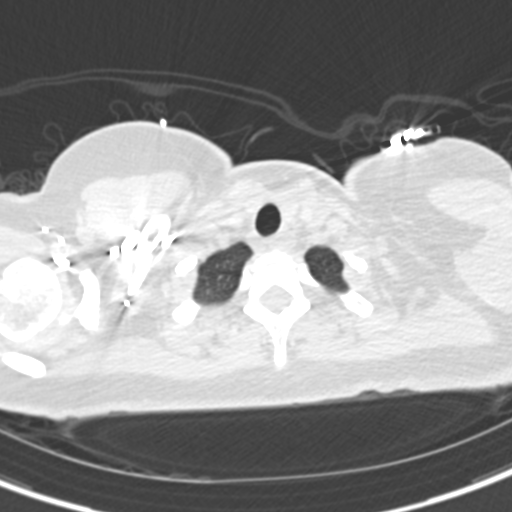

[14 of 36 positions shown; findings below may reference images not displayed]

FINDINGS: Cardiovascular: Satisfactory opacification of the pulmonary arteries
to the segmental level. No evidence of pulmonary embolism. Normal
heart size. No pericardial effusion.

Mediastinum/Nodes: Negative.  No adenopathy or mass

Lungs/Pleura: Low volume chest with clear lungs. No effusion or
pneumothorax

Upper Abdomen: No acute finding.  Punctate right renal calculus.

Musculoskeletal: No acute or aggressive finding.

Review of the MIP images confirms the above findings.
IMPRESSION: Negative chest CTA.

## 2019-08-17 MED ORDER — IOHEXOL 300 MG/ML  SOLN: 100.0000 mL | Freq: Once | INTRAMUSCULAR | Status: DC | PRN

## 2019-08-17 MED ORDER — IOHEXOL 350 MG/ML SOLN
100.0000 mL | Freq: Once | INTRAVENOUS | Status: AC | PRN
  Administered 2019-08-17: 06:00:00 100 mL via INTRAVENOUS

## 2019-08-17 NOTE — ED Notes (Signed)
Patient transported to CT 

## 2019-08-17 NOTE — ED Provider Notes (Signed)
[facility] EMERGENCY DEPARTMENT Provider Note   CSN: [id number] Arrival date & time: 08/17/19  0407     History   Chief Complaint Chief Complaint  Patient presents with   Shortness of Breath    HPI Beverly Richards is a 28 y.o. female.     HPI  This is a 28 year old female with a history of anemia, hypertension, Crohn's disease who presents with shortness of breath.  Patient reports a 3 to 4-day history of shortness of breath.  She states that when she lays flat she feels like "I cannot catch my breath."  She also reports some left arm numbness.  She has never had symptoms like this before.  Denies any recent illnesses, cough, fever.  No known sick contacts or Covid exposures.  Patient denies any chest pain at this time.  Denies any history of leg swelling or blood clots.  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      Home Medications    Prior to Admission medications   Medication Sig Start Date End Date Taking? Authorizing Provider  amLODipine (NORVASC) 5 MG tablet Take 5 mg by mouth daily. Pt unsure of dosage   Yes [provider]  cloNIDine (CATAPRES) 0.1 MG tablet Take 0.1 mg by mouth 2 (two) times daily.   Yes [provider]    hydrochlorothiazide (HYDRODIURIL) 12.5 MG tablet Take 12.5 mg by mouth daily.   Yes [provider]  ciprofloxacin (CIPRO) 500 MG tablet Take 1 tablet (500 mg total) by mouth every 12 (twelve) hours. 06/19/15   Evelina Bucy, MD  ferrous sulfate 325 (65 FE) MG tablet Take 1 tablet (325 mg total) by mouth 2 (two) times daily with a meal. 10/24/13   Tat, Shanon Brow, MD  ferrous sulfate 325 (65 FE) MG tablet Take 1 tablet (325 mg total) by mouth 2 (two) times daily with a meal. 02/04/15   Daniel Nones, [place], PA-C  HYDROcodone-acetaminophen (NORCO/VICODIN) 5-325 MG tablet Take 1-2 tablets by mouth every 8 (eight) hours as needed. 05/06/19   Volanda Napoleon, PA-C  Multiple Vitamin (MULTIVITAMIN WITH MINERALS) TABS tablet Take 1 tablet by mouth every morning.    [provider]  ondansetron (ZOFRAN ODT) 4 MG disintegrating tablet Take 1 tablet (4 mg total) by mouth every 8 (eight) hours as needed for nausea or vomiting. 05/06/19   Providence Lanius A, PA-C  ondansetron (ZOFRAN) 4 MG tablet Take 1 tablet (4 mg total) by mouth every 6 (six) hours. 05/12/14   Glendell Docker, NP  predniSONE (DELTASONE) 10 MG tablet Take 3 tablets (30 mg total) by mouth daily with breakfast. 08/24/14   Brunetta Jeans, PA-C  predniSONE (DELTASONE) 20 MG tablet 1 tab daily 06/19/15  Evelina Bucy, MD    Family History Family History  Problem Relation Age of Onset   Diabetes Mother    Hyperlipidemia Mother    Diabetes Maternal Grandmother    Hyperlipidemia Maternal Grandmother    Birth defects Paternal Grandmother        Breast Cancer    Social History Social History   Tobacco Use   Smoking status: Never Smoker   Smokeless tobacco: Never Used  Substance Use Topics   Alcohol use: No   Drug use: No     Allergies   Benadryl [diphenhydramine hcl] and Penicillins   Review of Systems Review of Systems  Constitutional: Negative for chills and fever.  Respiratory: Positive for shortness of  breath. Negative for cough.   Cardiovascular: Negative for chest pain and leg swelling.  Gastrointestinal: Negative for abdominal pain, nausea and vomiting.  Genitourinary: Negative for dysuria.  All other systems reviewed and are negative.    Physical Exam Updated Vital Signs BP 122/85    Pulse 65    Temp 98.6 F (37 C) (Oral)    Resp 17    Ht 1.6 m (5\' 3" )    Wt 66.7 kg    LMP 07/18/2019    SpO2 100%    BMI 26.04 kg/m   Physical Exam Vitals signs and nursing note reviewed.  Constitutional:      Appearance: She is well-developed. She is not ill-appearing.  HENT:     Head: Normocephalic and atraumatic.  Eyes:     Pupils: Pupils are equal, round, and reactive to light.  Neck:     Musculoskeletal: Neck supple.  Cardiovascular:     Rate and Rhythm: Normal rate and regular rhythm.     Heart sounds: Normal heart sounds.  Pulmonary:     Effort: Pulmonary effort is normal. No respiratory distress.     Breath sounds: No wheezing.  Chest:     Chest wall: No tenderness.  Abdominal:     General: Bowel sounds are normal.     Palpations: Abdomen is soft.  Musculoskeletal:     Right lower leg: She exhibits no tenderness. No edema.     Left lower leg: She exhibits no tenderness. No edema.  Skin:    General: Skin is warm and dry.  Neurological:     Mental Status: She is alert and oriented to person, place, and time.  Psychiatric:        Mood and Affect: Mood normal.      ED Treatments / Results  Labs (all labs ordered are listed, but only abnormal results are displayed) Labs Reviewed  CBC WITH DIFFERENTIAL/PLATELET - Abnormal; Notable for the following components:      Result Value   RBC 3.37 (*)    Hemoglobin 9.2 (*)    HCT 29.5 (*)    All other components within normal limits  BASIC METABOLIC PANEL - Abnormal; Notable for the following components:   CO2 20 (*)    Calcium 8.5 (*)    All other components within normal limits  D-DIMER, QUANTITATIVE (NOT AT [facility]) -  Abnormal; Notable for the following components:   D-Dimer, Quant 0.52 (*)    All other components within normal limits  PREGNANCY, URINE    EKG EKG Interpretation  Date/Time:  Monday August 17 2019 04:17:29 EST Ventricular Rate:  67 PR Interval:    QRS Duration: 79 QT Interval:  373 QTC Calculation: 394 R Axis:   66 Text Interpretation: Sinus arrhythmia Confirmed by Tanina Barb,  Loma Sousa [phone]) on 08/17/2019 5:03:06 AM   Radiology Dg Chest 2 View  Result Date: 08/17/2019 CLINICAL DATA:  Shortness of breath for 4 days. Mid upper chest pain. EXAM: CHEST - 2 VIEW COMPARISON:  02/04/2015 FINDINGS: The cardiomediastinal contours are normal. The lungs are clear. Pulmonary vasculature is normal. No consolidation, pleural effusion, or pneumothorax. No acute osseous abnormalities are seen. IMPRESSION: Normal radiographs of the chest. Electronically Signed   By: Keith Rake M.D.   On: 08/17/2019 05:25   Ct Angio Chest Pe W And/or Wo Contrast  Result Date: 08/17/2019 CLINICAL DATA:  Chest pain for 4 days EXAM: CT ANGIOGRAPHY CHEST WITH CONTRAST TECHNIQUE: Multidetector CT imaging of the chest was performed using the standard protocol during bolus administration of intravenous contrast. Multiplanar CT image reconstructions and MIPs were obtained to evaluate the vascular anatomy. CONTRAST:  124mL OMNIPAQUE IOHEXOL 350 MG/ML SOLN COMPARISON:  None. FINDINGS: Cardiovascular: Satisfactory opacification of the pulmonary arteries to the segmental level. No evidence of pulmonary embolism. Normal heart size. No pericardial effusion. Mediastinum/Nodes: Negative.  No adenopathy or mass Lungs/Pleura: Low volume chest with clear lungs. No effusion or pneumothorax Upper Abdomen: No acute finding.  Punctate right renal calculus. Musculoskeletal: No acute or aggressive finding. Review of the MIP images confirms the above findings. IMPRESSION: Negative chest CTA. Electronically Signed   By: Monte Fantasia M.D.    On: 08/17/2019 06:31    Procedures Procedures (including critical care time)  Medications Ordered in ED Medications  iohexol (OMNIPAQUE) 350 MG/ML injection 100 mL (100 mLs Intravenous Contrast Given 08/17/19 0609)     Initial Impression / Assessment and Plan / ED Course  I have reviewed the triage vital signs and the nursing notes.  Pertinent labs & imaging results that were available during my care of the patient were reviewed by me and considered in my medical decision making (see chart for details).        Patient presents with shortness of breath.  She is overall nontoxic-appearing and vital signs are reassuring.  No hypoxia.  No significant arrhythmia or ischemia on her EKG.  Chest x-ray without pneumothorax or pneumonia.  Screening D-dimer was positive.  CT of the chest was obtained and shows no evidence of blood clots.  Patient has rested comfortably while in the emergency department.  Patient's blood work notable for persistent anemia with a hemoglobin of 9.2.  Patient with a known history of anemia sometimes as low as 7.6.  Do not think this is contributing to her shortness of breath.  On recheck, she remains comfortable and has rested comfortably.  Will discharge and have her follow-up closely with her primary physician.  At this time etiology of her shortness of breath is unknown; however, I doubt acute emergent process.  After history, exam, and medical workup I feel the patient has been appropriately medically screened and is safe for discharge home. Pertinent diagnoses were discussed with the patient. Patient was given return precautions.  Final Clinical Impressions(s) / ED Diagnoses   Final diagnoses:  SOB (shortness of breath)    ED Discharge Orders    None       Gearldine Looney, Barbette Hair, MD 08/17/19 [phone]

## 2019-08-17 NOTE — ED Triage Notes (Signed)
Pt presents with SOB x 4 days and left arm numbness x 3 hrs. Pt states she woke up and felt like she slept wrong but the numbness started getting worse. Pt also states hx of anxiety but this feels different.

## 2019-08-17 NOTE — Discharge Instructions (Addendum)
You were seen today for shortness of breath.  All your work-up is reassuring including work-up for blood clots.  Follow-up with your primary physician if symptoms persist.  If you develop new or worsening symptoms you should be reevaluated.

## 2019-08-17 NOTE — ED Notes (Signed)
Pt transported to xray 

## 2020-09-11 ENCOUNTER — Other Ambulatory Visit: Payer: Self-pay

## 2020-09-11 ENCOUNTER — Emergency Department (HOSPITAL_BASED_OUTPATIENT_CLINIC_OR_DEPARTMENT_OTHER)
Admission: EM | Admit: 2020-09-11 | Discharge: 2020-09-11 | Disposition: A | Discharge status: Home / Self Care | Payer: Managed Care, Other (non HMO) | Attending: Emergency Medicine | Admitting: Emergency Medicine

## 2020-09-11 DIAGNOSIS — L290 Pruritus ani: Secondary | ICD-10-CM | POA: Insufficient documentation

## 2020-09-11 DIAGNOSIS — T7840XA Allergy, unspecified, initial encounter: Secondary | ICD-10-CM | POA: Insufficient documentation

## 2020-09-11 LAB — PREGNANCY, URINE: Preg Test, Ur: NEGATIVE

## 2020-09-11 MED ORDER — PREDNISONE 50 MG PO TABS: 60.0000 mg | ORAL_TABLET | Freq: Once | ORAL | Status: DC

## 2020-09-11 MED ORDER — PREDNISONE 20 MG PO TABS: 40.0000 mg | ORAL_TABLET | Freq: Every day | ORAL | 0 refills | Status: AC

## 2020-09-11 NOTE — ED Triage Notes (Signed)
Allergic reaction to cat dander two hours ago. Reports feeling swollen on right side of face. Reports some trouble breathing, airway unobstructed at this time

## 2020-09-11 NOTE — ED Provider Notes (Signed)
[facility] EMERGENCY DEPARTMENT Provider Note   CSN: [id number] Arrival date & time: 09/11/20  2119     History Chief Complaint  Patient presents with  . cat dander exposure    Beverly Richards is a 29 y.o. female.  The history is provided by the patient.  Allergic Reaction Presenting symptoms: itching and rash   Presenting symptoms: no difficulty swallowing   Severity:  Mild Prior allergic episodes:  Animal allergies Context: animal exposure   Relieved by:  Antihistamines Worsened by:  Nothing      Past Medical History:  Diagnosis Date  . Anemia   . Crohn's disease ([place])   . Monocytosis 10/21/2013  . Ovarian cyst     Patient Active Problem List   Diagnosis Date Noted  . Anemia, iron deficiency 09/14/2014  . Crohn disease ([place]) 07/27/2014  . 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 Protein-calorie malnutrition, severe ([place]) 10/22/2013  . Anemia 10/21/2013  . Abdominal pain 10/21/2013  . Leukopenia 10/21/2013  . Monocytosis 10/21/2013    Past Surgical History:  Procedure Laterality Date  . COLONOSCOPY N/A 10/23/2013   Procedure: COLONOSCOPY;  Surgeon: Missy Sabins, MD;  Location: [facility];  Service: Endoscopy;  Laterality: N/A;  . NO PAST SURGERIES    . TONSILLECTOMY       OB History   No obstetric history on file.     Family History  Problem Relation Age of Onset  . Diabetes Mother   . Hyperlipidemia Mother   . Diabetes Maternal Grandmother   . Hyperlipidemia Maternal Grandmother   . Birth defects Paternal Grandmother        Breast Cancer    Social History   Tobacco Use  . Smoking status: Never Smoker  . Smokeless tobacco: Never Used  Vaping Use  . Vaping Use: Never used  Substance Use Topics  . Alcohol use: No  . Drug use: No    Home Medications Prior to Admission medications   Medication Sig Start Date End Date Taking? Authorizing Provider  amLODipine (NORVASC) 5 MG tablet Take 5 mg by mouth daily. Pt unsure of  dosage    [provider]  ciprofloxacin (CIPRO) 500 MG tablet Take 1 tablet (500 mg total) by mouth every 12 (twelve) hours. 06/19/15   Evelina Bucy, MD  cloNIDine (CATAPRES) 0.1 MG tablet Take 0.1 mg by mouth 2 (two) times daily.    [provider]  ferrous sulfate 325 (65 FE) MG tablet Take 1 tablet (325 mg total) by mouth 2 (two) times daily with a meal. 10/24/13   Tat, Shanon Brow, MD  ferrous sulfate 325 (65 FE) MG tablet Take 1 tablet (325 mg total) by mouth 2 (two) times daily with a meal. 02/04/15   Daniel Nones, [place], PA-C  hydrochlorothiazide (HYDRODIURIL) 12.5 MG tablet Take 12.5 mg by mouth daily.    [provider]  HYDROcodone-acetaminophen (NORCO/VICODIN) 5-325 MG tablet Take 1-2 tablets by mouth every 8 (eight) hours as needed. 05/06/19   Volanda Napoleon, PA-C  Multiple Vitamin (MULTIVITAMIN WITH MINERALS) TABS tablet Take 1 tablet by mouth every morning.    [provider]  ondansetron (ZOFRAN ODT) 4 MG disintegrating tablet Take 1 tablet (4 mg total) by mouth every 8 (eight) hours as needed for nausea or vomiting. 05/06/19   Providence Lanius A, PA-C  ondansetron (ZOFRAN) 4 MG tablet Take 1 tablet (4 mg total) by mouth every 6 (six) hours. 05/12/14   Glendell Docker, NP  predniSONE (DELTASONE) 20 MG  tablet Take 2 tablets (40 mg total) by mouth daily for 3 days. 09/11/20 09/14/20  Lennice Sites, DO    Allergies    Benadryl [diphenhydramine hcl] and Penicillins  Review of Systems   Review of Systems  Constitutional: Negative for fever.  HENT: Positive for facial swelling. Negative for congestion, drooling, sore throat, trouble swallowing and voice change.   Respiratory: Negative for shortness of breath.   Skin: Positive for itching and rash.    Physical Exam Updated Vital Signs BP (!) 175/107   Pulse 76   Temp 98 F (36.7 C)   Resp 18   LMP 08/14/2020 (Approximate)   SpO2 100%   Physical Exam Vitals and nursing note reviewed.   Constitutional:      General: She is not in acute distress.    Appearance: She is well-developed and well-nourished. She is not ill-appearing.  HENT:     Head: Normocephalic and atraumatic.     Nose: Nose normal.     Mouth/Throat:     Mouth: Mucous membranes are moist.  Eyes:     Conjunctiva/sclera: Conjunctivae normal.     Pupils: Pupils are equal, round, and reactive to light.  Cardiovascular:     Rate and Rhythm: Normal rate and regular rhythm.     Pulses: Normal pulses.     Heart sounds: Normal heart sounds. No murmur heard.   Pulmonary:     Effort: Pulmonary effort is normal. No respiratory distress.     Breath sounds: Normal breath sounds.  Abdominal:     Palpations: Abdomen is soft.     Tenderness: There is no abdominal tenderness.  Musculoskeletal:        General: No edema.     Cervical back: Neck supple.  Skin:    General: Skin is warm and dry.     Findings: No rash.  Neurological:     Mental Status: She is alert.  Psychiatric:        Mood and Affect: Mood and affect normal.     ED Results / Procedures / Treatments   Labs (all labs ordered are listed, but only abnormal results are displayed) Labs Reviewed  PREGNANCY, URINE    EKG None  Radiology No results found.  Procedures Procedures (including critical care time)  Medications Ordered in ED Medications  predniSONE (DELTASONE) tablet 60 mg (has no administration in time range)    ED Course  I have reviewed the triage vital signs and the nursing notes.  Pertinent labs & imaging results that were available during my care of the patient were reviewed by me and considered in my medical decision making (see chart for details).    MDM Rules/Calculators/A&P                           Beverly Richards is here for allergic reaction.  Allergic to cats.  Came into contact with a cat.  Started to have some itchiness and swelling to the right side of her face.  Took Claritin and swelling has improved.   On exam overall no signs of an allergic reaction.  No hives, no wheezing, no signs to suggest anaphylaxis.  Will give prednisone.  Understands return precautions and discharged in ED in good condition.  This chart was dictated using voice recognition software.  Despite best efforts to proofread,  errors can occur which can change the documentation meaning.    Final Clinical Impression(s) / ED Diagnoses Final diagnoses:  Allergic reaction, initial encounter    Rx / DC Orders ED Discharge Orders         Ordered    predniSONE (DELTASONE) 20 MG tablet  Daily        09/11/20 0410           Lennice Sites, DO 09/11/20 [phone]

## 2021-02-27 ENCOUNTER — Emergency Department (HOSPITAL_BASED_OUTPATIENT_CLINIC_OR_DEPARTMENT_OTHER)
Admission: EM | Admit: 2021-02-27 | Discharge: 2021-02-27 | Disposition: A | Discharge status: Home / Self Care | Payer: Managed Care, Other (non HMO) | Attending: Emergency Medicine | Admitting: Emergency Medicine

## 2021-02-27 ENCOUNTER — Encounter (HOSPITAL_BASED_OUTPATIENT_CLINIC_OR_DEPARTMENT_OTHER): Payer: Self-pay | Admitting: Emergency Medicine

## 2021-02-27 ENCOUNTER — Other Ambulatory Visit: Payer: Self-pay

## 2021-02-27 DIAGNOSIS — R0602 Shortness of breath: Secondary | ICD-10-CM | POA: Insufficient documentation

## 2021-02-27 DIAGNOSIS — R202 Paresthesia of skin: Secondary | ICD-10-CM | POA: Diagnosis not present

## 2021-02-27 DIAGNOSIS — H538 Other visual disturbances: Secondary | ICD-10-CM | POA: Diagnosis not present

## 2021-02-27 DIAGNOSIS — I10 Essential (primary) hypertension: Secondary | ICD-10-CM | POA: Insufficient documentation

## 2021-02-27 DIAGNOSIS — M940 Chondrocostal junction syndrome [Tietze]: Secondary | ICD-10-CM

## 2021-02-27 DIAGNOSIS — Z79899 Other long term (current) drug therapy: Secondary | ICD-10-CM | POA: Diagnosis not present

## 2021-02-27 DIAGNOSIS — R072 Precordial pain: Secondary | ICD-10-CM | POA: Diagnosis present

## 2021-02-27 HISTORY — DX: Essential (primary) hypertension: I10

## 2021-02-27 HISTORY — DX: Anxiety disorder, unspecified: F41.9

## 2021-02-27 LAB — PREGNANCY, URINE: Preg Test, Ur: NEGATIVE

## 2021-02-27 LAB — CBG MONITORING, ED: Glucose-Capillary: 99 mg/dL (ref 70–99)

## 2021-02-27 MED ORDER — KETOROLAC TROMETHAMINE 15 MG/ML IJ SOLN
15.0000 mg | Freq: Once | INTRAMUSCULAR | Status: AC
  Administered 2021-02-27: 15 mg via INTRAVENOUS
  Filled 2021-02-27: qty 1

## 2021-02-27 NOTE — ED Triage Notes (Signed)
Pt reports SHOB, chest pain and left arm pain, blurred vision; pt denies hx of asthma, endorses hx of HTN and anxiety; says she took her BP at home tonight and it was 180; pt does not appear in distress

## 2021-02-27 NOTE — ED Provider Notes (Signed)
[facility] DEPT [facility]HP Provider Note: Georgena Spurling, MD, FACEP  CSN: [id number] MRN: [id number] ARRIVAL: 02/27/21 at [place]: [place]  Chest Pain   HISTORY OF PRESENT ILLNESS  02/27/21 3:17 AM Beverly Richards is a 30 y.o. female who developed chest pain about midnight this morning.  She describes the chest pain as being parasternal and feeling like a tightness.  She also has some mild shortness of breath which she describes as feeling like she is having difficulty taking a deep breath.  She rates her chest discomfort as a 7 out of 10.  It is worse with palpation or movement.  She also has some paresthesias of the left shoulder and some blurred vision.  She denies other symptoms.  She took her blood pressure at home earlier and it was greater than 220 systolic.  On arrival here it was down to 152/102.  She denies feeling anxious.   Past Medical History:  Diagnosis Date  . Anemia   . Anxiety   . Crohn's disease ([place])   . Hypertension   . Monocytosis 10/21/2013  . Ovarian cyst     Past Surgical History:  Procedure Laterality Date  . COLONOSCOPY N/A 10/23/2013   Procedure: COLONOSCOPY;  Surgeon: Missy Sabins, MD;  Location: [facility];  Service: Endoscopy;  Laterality: N/A;  . NO PAST SURGERIES    . TONSILLECTOMY      Family History  Problem Relation Age of Onset  . Diabetes Mother   . Hyperlipidemia Mother   . Diabetes Maternal Grandmother   . Hyperlipidemia Maternal Grandmother   . Birth defects Paternal Grandmother        Breast Cancer    Social History   Tobacco Use  . Smoking status: Never Smoker  . Smokeless tobacco: Never Used  Vaping Use  . Vaping Use: Never used  Substance Use Topics  . Alcohol use: No  . Drug use: No    Prior to Admission medications   Medication Sig Start Date End Date Taking? Authorizing Provider  amLODipine (NORVASC) 5 MG tablet Take 5 mg by mouth daily. Pt unsure of dosage    [provider]   cloNIDine (CATAPRES) 0.1 MG tablet Take 0.1 mg by mouth 2 (two) times daily.    [provider]  hydrochlorothiazide (HYDRODIURIL) 12.5 MG tablet Take 12.5 mg by mouth daily.    [provider]  Multiple Vitamin (MULTIVITAMIN WITH MINERALS) TABS tablet Take 1 tablet by mouth every morning.    [provider]  ferrous sulfate 325 (65 FE) MG tablet Take 1 tablet (325 mg total) by mouth 2 (two) times daily with a meal. 02/04/15 02/27/21  Al Corpus, PA-C    Allergies Benadryl [diphenhydramine hcl] and Penicillins   REVIEW OF SYSTEMS  Negative except as noted here or in the History of Present Illness.   PHYSICAL EXAMINATION  Initial Vital Signs Blood pressure (!) 152/102, pulse 84, temperature 98.6 F (37 C), temperature source Oral, resp. rate 19, height 5\' 3"  (1.6 m), weight 63.5 kg, SpO2 96 %.  Examination General: Well-developed, well-nourished female in no acute distress; appearance consistent with age of record HENT: normocephalic; atraumatic Eyes: pupils equal, round and reactive to light; extraocular muscles intact Neck: supple Heart: regular rate and rhythm Lungs: clear to auscultation bilaterally Chest: Parasternal tenderness bilaterally Abdomen: soft; nondistended; nontender; bowel sounds present Extremities: No deformity; full range of motion; pulses normal Neurologic: Awake, alert and oriented; motor function intact in all  extremities and symmetric; no facial droop Skin: Warm and dry Psychiatric: Normal mood and affect   RESULTS  Summary of this visit's results, reviewed and interpreted by myself:   EKG Interpretation  Date/Time:  Monday Feb 27 2021 03:05:30 EDT Ventricular Rate:  82 PR Interval:  148 QRS Duration: 83 QT Interval:  376 QTC Calculation: 440 R Axis:   62 Text Interpretation: Sinus rhythm Normal ECG No significant change was found Confirmed by Danylle Ouk, Jenny Reichmann [phone]) on 02/27/2021 3:29:30 AM      Laboratory  Studies: Results for orders placed or performed during the hospital encounter of 02/27/21 (from the past 24 hour(s))  CBG monitoring, ED     Status: None   Collection Time: 02/27/21  3:11 AM  Result Value Ref Range   Glucose-Capillary 99 70 - 99 mg/dL  Pregnancy, urine     Status: None   Collection Time: 02/27/21  3:35 AM  Result Value Ref Range   Preg Test, Ur NEGATIVE NEGATIVE   Imaging Studies: No results found.  ED COURSE and MDM  Nursing notes, initial and subsequent vitals signs, including pulse oximetry, reviewed and interpreted by myself.  Vitals:   02/27/21 0301 02/27/21 0339 02/27/21 0413 02/27/21 0430  BP: (!) 152/102  123/77 123/82  Pulse: 84  71 69  Resp: 19  15 14   Temp: 98.6 F (37 C)     TempSrc: Oral     SpO2: 96% 100% 100% 100%  Weight:      Height:       Medications  ketorolac (TORADOL) 15 MG/ML injection 15 mg (15 mg Intravenous Given 02/27/21 0357)   5:08 AM Chest discomfort relieved with IV Toradol.  I suspect this represents costochondritis.  She was advised to take over-the-counter Aleve as needed should symptoms recur.   PROCEDURES  Procedures   ED DIAGNOSES     ICD-10-CM   1. Costochondritis  M94.0        Acquanetta Cabanilla, Jenny Reichmann, MD 02/27/21 [phone]

## 2021-09-14 ENCOUNTER — Emergency Department (HOSPITAL_BASED_OUTPATIENT_CLINIC_OR_DEPARTMENT_OTHER): Payer: 59

## 2021-09-14 ENCOUNTER — Other Ambulatory Visit: Payer: Self-pay

## 2021-09-14 ENCOUNTER — Emergency Department (HOSPITAL_BASED_OUTPATIENT_CLINIC_OR_DEPARTMENT_OTHER)
Admission: EM | Admit: 2021-09-14 | Discharge: 2021-09-14 | Disposition: A | Discharge status: Home / Self Care | Payer: 59 | Attending: Emergency Medicine | Admitting: Emergency Medicine

## 2021-09-14 ENCOUNTER — Encounter (HOSPITAL_BASED_OUTPATIENT_CLINIC_OR_DEPARTMENT_OTHER): Payer: Self-pay

## 2021-09-14 DIAGNOSIS — I10 Essential (primary) hypertension: Secondary | ICD-10-CM | POA: Insufficient documentation

## 2021-09-14 DIAGNOSIS — R079 Chest pain, unspecified: Secondary | ICD-10-CM | POA: Diagnosis present

## 2021-09-14 DIAGNOSIS — Z79899 Other long term (current) drug therapy: Secondary | ICD-10-CM | POA: Insufficient documentation

## 2021-09-14 DIAGNOSIS — R0789 Other chest pain: Secondary | ICD-10-CM | POA: Diagnosis not present

## 2021-09-14 LAB — BASIC METABOLIC PANEL
Anion gap: 7 (ref 5–15)
BUN: 11 mg/dL (ref 6–20)
CO2: 24 mmol/L (ref 22–32)
Calcium: 8.9 mg/dL (ref 8.9–10.3)
Chloride: 104 mmol/L (ref 98–111)
Creatinine, Ser: 0.58 mg/dL (ref 0.44–1.00)
GFR, Estimated: >60 mL/min (ref >60)
Glucose, Bld: 97 mg/dL (ref 70–99)
Potassium: 3.5 mmol/L (ref 3.5–5.1)
Sodium: 135 mmol/L (ref 135–145)

## 2021-09-14 LAB — CBC WITH DIFFERENTIAL/PLATELET
Abs Immature Granulocytes: 0.02 10*3/uL (ref 0.00–0.07)
Basophils Absolute: 0.1 10*3/uL (ref 0.0–0.1)
Basophils Relative: 1 %
Eosinophils Absolute: 0.3 10*3/uL (ref 0.0–0.5)
Eosinophils Relative: 4 %
HCT: 32 % — ABNORMAL LOW (ref 36.0–46.0)
Hemoglobin: 10.8 g/dL — ABNORMAL LOW (ref 12.0–15.0)
Immature Granulocytes: 0 %
Lymphocytes Relative: 25 %
Lymphs Abs: 1.6 10*3/uL (ref 0.7–4.0)
MCH: 29.1 pg (ref 26.0–34.0)
MCHC: 33.8 g/dL (ref 30.0–36.0)
MCV: 86.3 fL (ref 80.0–100.0)
Monocytes Absolute: 1 10*3/uL (ref 0.1–1.0)
Monocytes Relative: 16 %
Neutro Abs: 3.4 10*3/uL (ref 1.7–7.7)
Neutrophils Relative %: 54 %
Platelets: 387 10*3/uL (ref 150–400)
RBC: 3.71 MIL/uL — ABNORMAL LOW (ref 3.87–5.11)
RDW: 13.8 % (ref 11.5–15.5)
WBC: 6.4 10*3/uL (ref 4.0–10.5)
nRBC: 0 % (ref 0.0–0.2)

## 2021-09-14 LAB — HCG, SERUM, QUALITATIVE: Preg, Serum: NEGATIVE

## 2021-09-14 LAB — TROPONIN I (HIGH SENSITIVITY): Troponin I (High Sensitivity): <2 ng/L (ref <18)

## 2021-09-14 IMAGING — DX DG CHEST 2V
2 series · 2 of 2 positions shown · non-contrast
Comparison: [DATE]

CLINICAL DATA: Left-sided chest pain

EXAM:
CHEST - 2 VIEW

[chest pa]
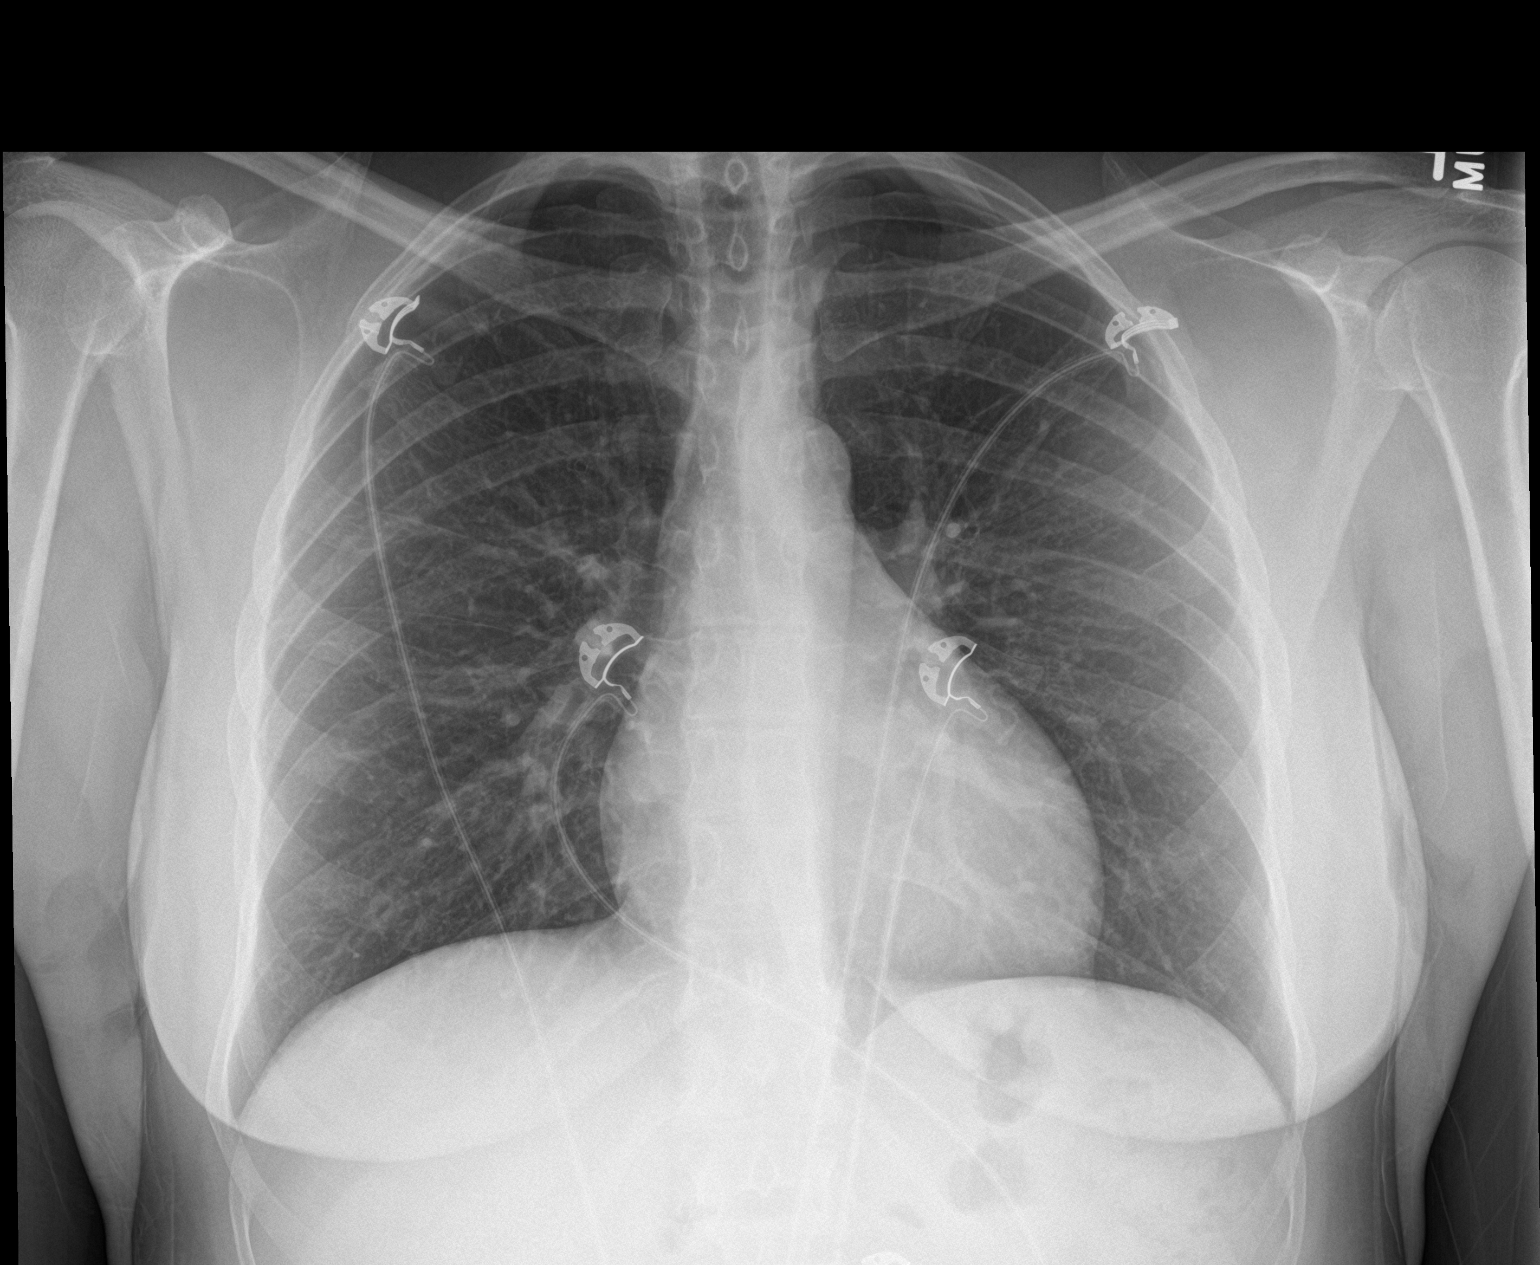

[chest lat]
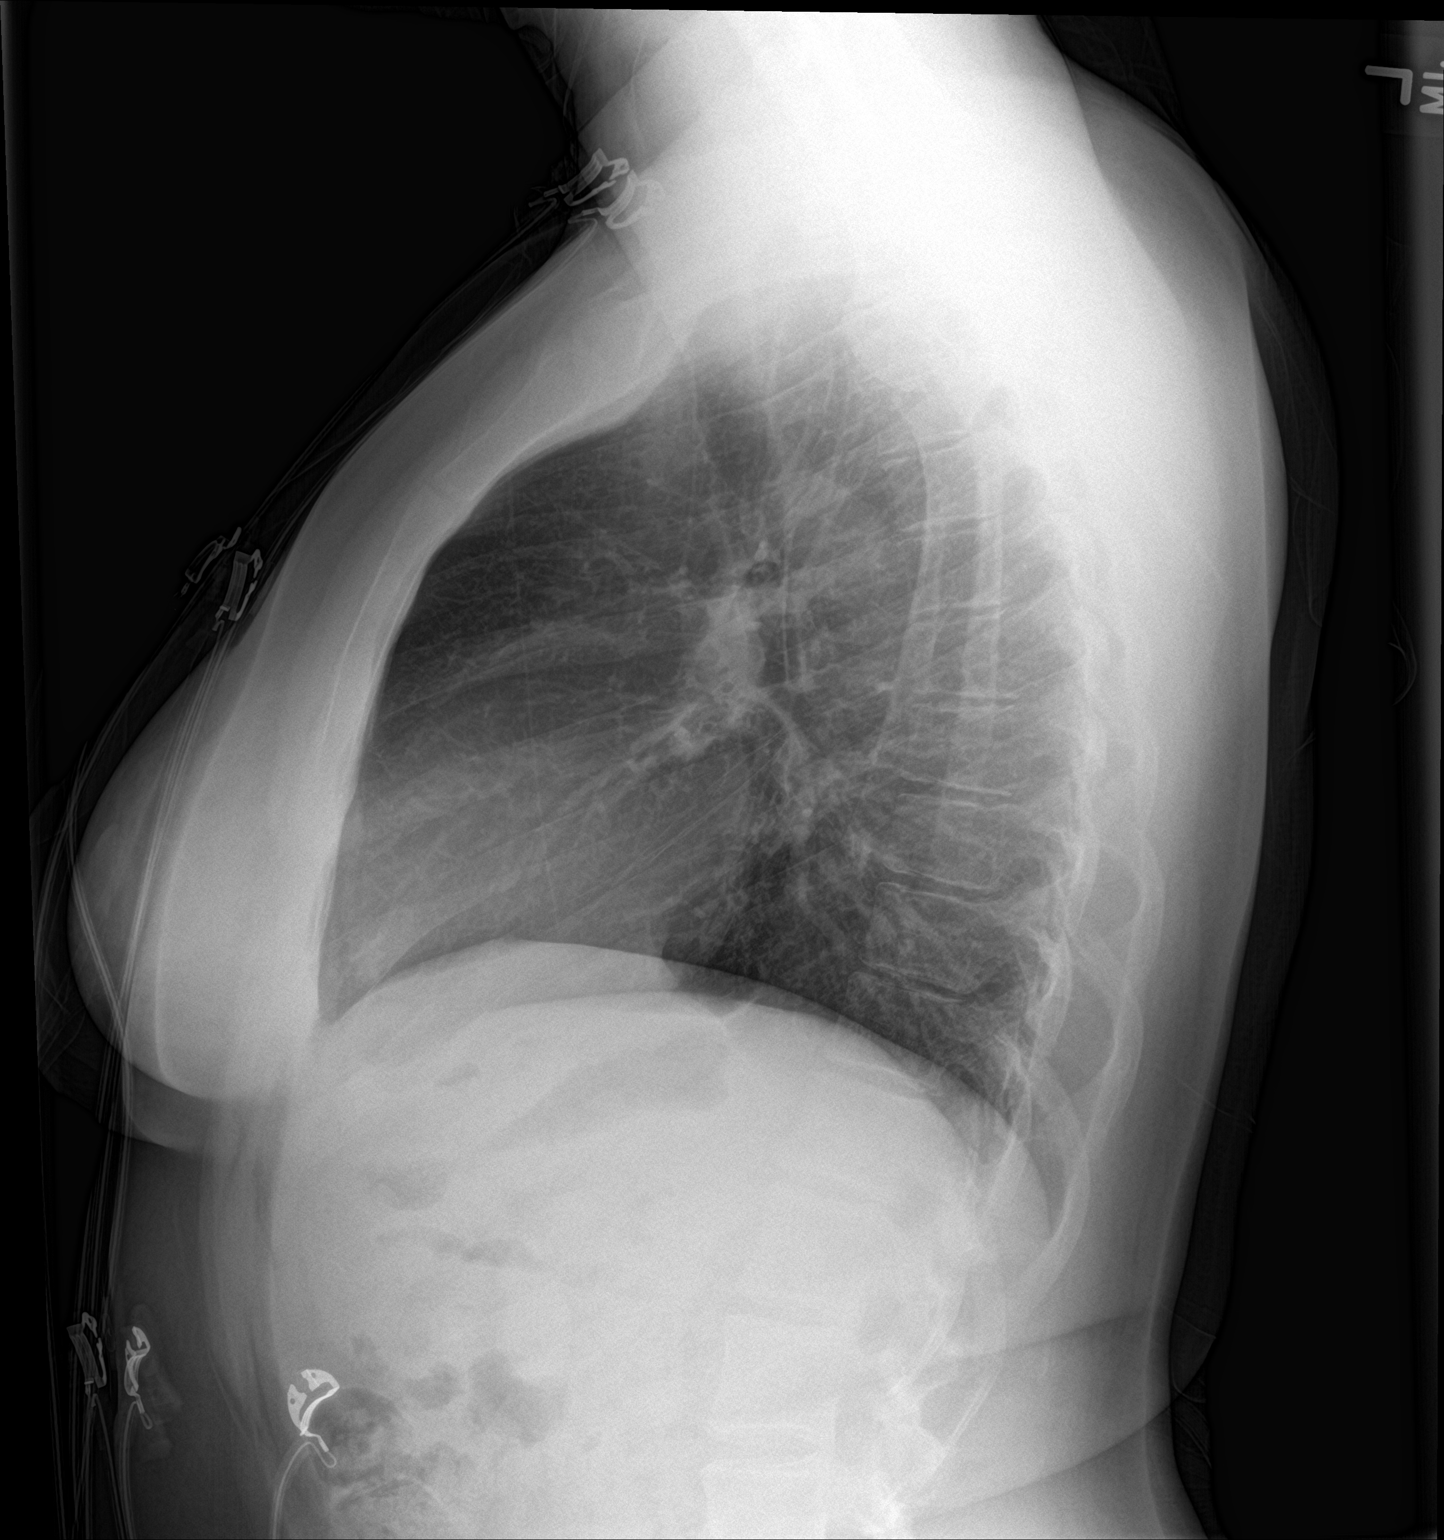

[2 of 2 positions shown; findings below may reference images not displayed]

FINDINGS: Normal heart size and mediastinal contours. No acute infiltrate or
edema. No effusion or pneumothorax. No acute osseous findings.

Artifact from EKG leads.
IMPRESSION: No active cardiopulmonary disease.

## 2021-09-14 MED ORDER — KETOROLAC TROMETHAMINE 15 MG/ML IJ SOLN
15.0000 mg | Freq: Once | INTRAMUSCULAR | Status: AC
  Administered 2021-09-14: 15 mg via INTRAVENOUS
  Filled 2021-09-14: qty 1

## 2021-09-14 NOTE — ED Notes (Signed)
Patient transported to X-ray 

## 2021-09-14 NOTE — ED Triage Notes (Signed)
Pt states she has had high blood pressure (151/102) and left sided chest pain for past few hours.  Pt took her amlodipine, HCTZ and clonidine this evening. Pt states she has had shortness of breath and lightheadedness, denies nausea or vomiting.

## 2021-09-14 NOTE — ED Provider Notes (Signed)
[facility] DEPT [facility] Provider Note: Georgena Spurling, MD, FACEP  CSN: [id number] MRN: [id number] ARRIVAL: 09/14/21 at [place]: [place]  Chest Pain   HISTORY OF PRESENT ILLNESS  09/14/21 3:39 AM Beverly Richards is a 30 y.o. female with a history of Crohn's disease and anxiety.  She is here with left upper chest pain for the past several hours.  The pain is a dull ache and she rates it as 8 out of 10.  It has been intermittent.  It is not significantly changed with palpation, movement or breathing.  She has had lightheadedness but is not sure she has had shortness of breath.  She has had no nausea, vomiting or diaphoresis.  She denies Crohn's symptoms at the present time.   Past Medical History:  Diagnosis Date   Anemia    Anxiety    Crohn's disease ([place])    Hypertension    Monocytosis 10/21/2013   Ovarian cyst     Past Surgical History:  Procedure Laterality Date   COLONOSCOPY N/A 10/23/2013   Procedure: COLONOSCOPY;  Surgeon: Missy Sabins, MD;  Location: [facility];  Service: Endoscopy;  Laterality: N/A;   NO PAST SURGERIES     TONSILLECTOMY      Family History  Problem Relation Age of Onset   Diabetes Mother    Hyperlipidemia Mother    Diabetes Maternal Grandmother    Hyperlipidemia Maternal Grandmother    Birth defects Paternal Grandmother        Breast Cancer    Social History   Tobacco Use   Smoking status: Never   Smokeless tobacco: Never  Vaping Use   Vaping Use: Never used  Substance Use Topics   Alcohol use: No   Drug use: No    Prior to Admission medications   Medication Sig Start Date End Date Taking? Authorizing Provider  amLODipine (NORVASC) 5 MG tablet Take 5 mg by mouth daily. Pt unsure of dosage    [provider]  cloNIDine (CATAPRES) 0.1 MG tablet Take 0.1 mg by mouth 2 (two) times daily.    [provider]  hydrochlorothiazide (HYDRODIURIL) 12.5 MG tablet Take 12.5 mg by mouth daily.     [provider]  Multiple Vitamin (MULTIVITAMIN WITH MINERALS) TABS tablet Take 1 tablet by mouth every morning.    [provider]  ferrous sulfate 325 (65 FE) MG tablet Take 1 tablet (325 mg total) by mouth 2 (two) times daily with a meal. 02/04/15 02/27/21  Al Corpus, PA-C    Allergies Benadryl [diphenhydramine hcl] and Penicillins   REVIEW OF SYSTEMS  Negative except as noted here or in the History of Present Illness.   PHYSICAL EXAMINATION  Initial Vital Signs Blood pressure (!) 151/98, pulse 63, temperature 97.9 F (36.6 C), temperature source Oral, resp. rate 18, height 5\' 3"  (1.6 m), weight 63.5 kg, last menstrual period 08/24/2021, SpO2 99 %.  Examination General: Well-developed, well-nourished female in no acute distress; appearance consistent with age of record HENT: normocephalic; atraumatic Eyes: pupils equal, round and reactive to light; extraocular muscles intact Neck: supple Heart: regular rate and rhythm; no murmur Lungs: clear to auscultation bilaterally Chest: Minimal left upper chest tenderness Abdomen: soft; nondistended; nontender; bowel sounds present Extremities: No deformity; full range of motion; pulses normal Neurologic: Awake, alert and oriented; motor function intact in all extremities and symmetric; no facial droop Skin: Warm and dry Psychiatric: Normal mood and affect   RESULTS  Summary of  this visit's results, reviewed and interpreted by myself:   EKG Interpretation  Date/Time:  Thursday September 14 2021 03:37:23 EST Ventricular Rate:  66 PR Interval:  193 QRS Duration: 83 QT Interval:  387 QTC Calculation: 406 R Axis:   62 Text Interpretation: Sinus rhythm Artifact Otherwise normal ECG Confirmed by Macallan Ord, Jenny Reichmann [phone]) on 09/14/2021 3:39:25 AM       Laboratory Studies: Results for orders placed or performed during the hospital encounter of 09/14/21 (from the past 24 hour(s))  Troponin I (High Sensitivity)      Status: None   Collection Time: 09/14/21  3:58 AM  Result Value Ref Range   Troponin I (High Sensitivity) <2 <18 ng/L  CBC with Differential/Platelet     Status: Abnormal   Collection Time: 09/14/21  3:58 AM  Result Value Ref Range   WBC 6.4 4.0 - 10.5 K/uL   RBC 3.71 (L) 3.87 - 5.11 MIL/uL   Hemoglobin 10.8 (L) 12.0 - 15.0 g/dL   HCT 32.0 (L) 36.0 - 46.0 %   MCV 86.3 80.0 - 100.0 fL   MCH 29.1 26.0 - 34.0 pg   MCHC 33.8 30.0 - 36.0 g/dL   RDW 13.8 11.5 - 15.5 %   Platelets 387 150 - 400 K/uL   nRBC 0.0 0.0 - 0.2 %   Neutrophils Relative % 54 %   Neutro Abs 3.4 1.7 - 7.7 K/uL   Lymphocytes Relative 25 %   Lymphs Abs 1.6 0.7 - 4.0 K/uL   Monocytes Relative 16 %   Monocytes Absolute 1.0 0.1 - 1.0 K/uL   Eosinophils Relative 4 %   Eosinophils Absolute 0.3 0.0 - 0.5 K/uL   Basophils Relative 1 %   Basophils Absolute 0.1 0.0 - 0.1 K/uL   Immature Granulocytes 0 %   Abs Immature Granulocytes 0.02 0.00 - 0.07 K/uL  Basic metabolic panel     Status: None   Collection Time: 09/14/21  3:58 AM  Result Value Ref Range   Sodium 135 135 - 145 mmol/L   Potassium 3.5 3.5 - 5.1 mmol/L   Chloride 104 98 - 111 mmol/L   CO2 24 22 - 32 mmol/L   Glucose, Bld 97 70 - 99 mg/dL   BUN 11 6 - 20 mg/dL   Creatinine, Ser 0.58 0.44 - 1.00 mg/dL   Calcium 8.9 8.9 - 10.3 mg/dL   GFR, Estimated >60 >60 mL/min   Anion gap 7 5 - 15  hCG, serum, qualitative     Status: None   Collection Time: 09/14/21  3:58 AM  Result Value Ref Range   Preg, Serum NEGATIVE NEGATIVE   Imaging Studies: DG Chest 2 View  Result Date: 09/14/2021 CLINICAL DATA:  Left-sided chest pain EXAM: CHEST - 2 VIEW COMPARISON:  08/17/2019 FINDINGS: Normal heart size and mediastinal contours. No acute infiltrate or edema. No effusion or pneumothorax. No acute osseous findings. Artifact from EKG leads. IMPRESSION: No active cardiopulmonary disease. Electronically Signed   By: Jorje Guild M.D.   On: 09/14/2021 04:18    ED COURSE  and MDM  Nursing notes, initial and subsequent vitals signs, including pulse oximetry, reviewed and interpreted by myself.  Vitals:   09/14/21 0400 09/14/21 0415 09/14/21 0430 09/14/21 0445  BP: 124/84 (!) 127/92 130/87   Pulse: 63 (!) 58 (!) 141   Resp: 13 15 13    Temp:      TempSrc:      SpO2: 100% 100%  100%  Weight:  Height:       Medications  ketorolac (TORADOL) 15 MG/ML injection 15 mg (15 mg Intravenous Given 09/14/21 0429)   4:46 AM Significant relief of pain with IV Toradol.  I suspect this represents chest wall pain.  She was seen by myself for similar complaint earlier this year.  Her troponin is less than 2 and her EKG is normal.   PROCEDURES  Procedures   ED DIAGNOSES     ICD-10-CM   1. Chest wall pain  R07.89          Shanon Rosser, MD 09/14/21 [phone]

## 2022-01-18 LAB — OB RESULTS CONSOLE ABO/RH: RH Type: POSITIVE

## 2022-01-23 LAB — OB RESULTS CONSOLE GC/CHLAMYDIA
Chlamydia: NEGATIVE
Neisseria Gonorrhea: NEGATIVE

## 2022-02-01 LAB — HEPATITIS C ANTIBODY: HCV Ab: NEGATIVE

## 2022-02-01 LAB — OB RESULTS CONSOLE HEPATITIS B SURFACE ANTIGEN: Hepatitis B Surface Ag: NEGATIVE

## 2022-02-01 LAB — OB RESULTS CONSOLE RPR: RPR: NONREACTIVE

## 2022-02-01 LAB — OB RESULTS CONSOLE HIV ANTIBODY (ROUTINE TESTING): HIV: NONREACTIVE

## 2022-02-01 LAB — OB RESULTS CONSOLE RUBELLA ANTIBODY, IGM: Rubella: IMMUNE

## 2022-03-01 ENCOUNTER — Other Ambulatory Visit: Payer: Self-pay

## 2022-03-01 ENCOUNTER — Inpatient Hospital Stay (HOSPITAL_BASED_OUTPATIENT_CLINIC_OR_DEPARTMENT_OTHER)
Admission: EM | Admit: 2022-03-01 | Discharge: 2022-03-02 | Disposition: A | Discharge status: Home / Self Care | Payer: 59 | Attending: Obstetrics & Gynecology | Admitting: Obstetrics & Gynecology

## 2022-03-01 ENCOUNTER — Encounter (HOSPITAL_BASED_OUTPATIENT_CLINIC_OR_DEPARTMENT_OTHER): Payer: Self-pay

## 2022-03-01 DIAGNOSIS — Z3490 Encounter for supervision of normal pregnancy, unspecified, unspecified trimester: Secondary | ICD-10-CM

## 2022-03-01 DIAGNOSIS — R509 Fever, unspecified: Secondary | ICD-10-CM | POA: Diagnosis not present

## 2022-03-01 DIAGNOSIS — O26892 Other specified pregnancy related conditions, second trimester: Secondary | ICD-10-CM | POA: Insufficient documentation

## 2022-03-01 DIAGNOSIS — O10012 Pre-existing essential hypertension complicating pregnancy, second trimester: Secondary | ICD-10-CM | POA: Diagnosis not present

## 2022-03-01 DIAGNOSIS — Z20822 Contact with and (suspected) exposure to covid-19: Secondary | ICD-10-CM | POA: Diagnosis not present

## 2022-03-01 DIAGNOSIS — K5000 Crohn's disease of small intestine without complications: Secondary | ICD-10-CM

## 2022-03-01 DIAGNOSIS — K50918 Crohn's disease, unspecified, with other complication: Secondary | ICD-10-CM

## 2022-03-01 DIAGNOSIS — O99891 Other specified diseases and conditions complicating pregnancy: Secondary | ICD-10-CM | POA: Diagnosis not present

## 2022-03-01 DIAGNOSIS — Z3A13 13 weeks gestation of pregnancy: Secondary | ICD-10-CM | POA: Insufficient documentation

## 2022-03-01 DIAGNOSIS — Z79899 Other long term (current) drug therapy: Secondary | ICD-10-CM | POA: Diagnosis not present

## 2022-03-01 DIAGNOSIS — R1032 Left lower quadrant pain: Secondary | ICD-10-CM | POA: Insufficient documentation

## 2022-03-01 DIAGNOSIS — R1031 Right lower quadrant pain: Secondary | ICD-10-CM

## 2022-03-01 LAB — COMPREHENSIVE METABOLIC PANEL
ALT: 23 U/L (ref 0–44)
AST: 28 U/L (ref 15–41)
Albumin: 2.7 g/dL — ABNORMAL LOW (ref 3.5–5.0)
Alkaline Phosphatase: 71 U/L (ref 38–126)
Anion gap: 8 (ref 5–15)
BUN: 11 mg/dL (ref 6–20)
CO2: 19 mmol/L — ABNORMAL LOW (ref 22–32)
Calcium: 8.6 mg/dL — ABNORMAL LOW (ref 8.9–10.3)
Chloride: 103 mmol/L (ref 98–111)
Creatinine, Ser: 0.72 mg/dL (ref 0.44–1.00)
GFR, Estimated: >60 mL/min (ref >60)
Glucose, Bld: 125 mg/dL — ABNORMAL HIGH (ref 70–99)
Potassium: 3.4 mmol/L — ABNORMAL LOW (ref 3.5–5.1)
Sodium: 130 mmol/L — ABNORMAL LOW (ref 135–145)
Total Bilirubin: 0.7 mg/dL (ref 0.3–1.2)
Total Protein: 6.9 g/dL (ref 6.5–8.1)

## 2022-03-01 LAB — CBC WITH DIFFERENTIAL/PLATELET
Abs Immature Granulocytes: 0.03 10*3/uL (ref 0.00–0.07)
Basophils Absolute: 0 10*3/uL (ref 0.0–0.1)
Basophils Relative: 0 %
Eosinophils Absolute: 0 10*3/uL (ref 0.0–0.5)
Eosinophils Relative: 0 %
HCT: 26.8 % — ABNORMAL LOW (ref 36.0–46.0)
Hemoglobin: 9.3 g/dL — ABNORMAL LOW (ref 12.0–15.0)
Immature Granulocytes: 1 %
Lymphocytes Relative: 5 %
Lymphs Abs: 0.3 10*3/uL — ABNORMAL LOW (ref 0.7–4.0)
MCH: 30.6 pg (ref 26.0–34.0)
MCHC: 34.7 g/dL (ref 30.0–36.0)
MCV: 88.2 fL (ref 80.0–100.0)
Monocytes Absolute: 0.4 10*3/uL (ref 0.1–1.0)
Monocytes Relative: 6 %
Neutro Abs: 5.5 10*3/uL (ref 1.7–7.7)
Neutrophils Relative %: 88 %
Platelets: 287 10*3/uL (ref 150–400)
RBC: 3.04 MIL/uL — ABNORMAL LOW (ref 3.87–5.11)
RDW: 12.5 % (ref 11.5–15.5)
WBC: 6.2 10*3/uL (ref 4.0–10.5)
nRBC: 0 % (ref 0.0–0.2)

## 2022-03-01 LAB — URINALYSIS, ROUTINE W REFLEX MICROSCOPIC
Glucose, UA: NEGATIVE mg/dL
Ketones, ur: NEGATIVE mg/dL
Leukocytes,Ua: NEGATIVE
Nitrite: NEGATIVE
Protein, ur: 30 mg/dL — AB
Specific Gravity, Urine: 1.025 (ref 1.005–1.030)
pH: 5.5 (ref 5.0–8.0)

## 2022-03-01 LAB — URINALYSIS, MICROSCOPIC (REFLEX): WBC, UA: NONE SEEN WBC/hpf (ref 0–5)

## 2022-03-01 LAB — LACTIC ACID, PLASMA
Lactic Acid, Venous: 1.4 mmol/L (ref 0.5–1.9)
Lactic Acid, Venous: 0.6 mmol/L (ref 0.5–1.9)

## 2022-03-01 LAB — SARS CORONAVIRUS 2 BY RT PCR: SARS Coronavirus 2 by RT PCR: NEGATIVE

## 2022-03-01 MED ORDER — ACETAMINOPHEN 325 MG PO TABS
650.0000 mg | ORAL_TABLET | Freq: Once | ORAL | Status: AC | PRN
  Administered 2022-03-01: 650 mg via ORAL
  Filled 2022-03-01: qty 2

## 2022-03-01 MED ORDER — SODIUM CHLORIDE 0.9 % IV BOLUS
1000.0000 mL | Freq: Once | INTRAVENOUS | Status: AC
  Administered 2022-03-01: 1000 mL via INTRAVENOUS

## 2022-03-01 NOTE — ED Triage Notes (Signed)
Patient reports fever and body aches that started earlier this afternoon Approx [redacted] weeks pregnant  Denies and abdominal pain or vaginal bleeding  Tylenol at 1430

## 2022-03-01 NOTE — ED Provider Notes (Signed)
[facility] EMERGENCY DEPARTMENT Provider Note   CSN: [id number] Arrival date & time: 03/01/22  1854     History  Chief Complaint  Patient presents with   Generalized Body Aches   Fever    JIMMY STIPES is a 31 y.o. female.   Fever Patient with fever and body aches.  Started this afternoon.  [redacted] weeks pregnant and states has had an ultrasound.  Has had some feeling of urinary frequency.  No vaginal bleeding or discharge.  No cough.  No known sick contacts.  Does have some abdominal pain.    Home Medications Prior to Admission medications   Medication Sig Start Date End Date Taking? Authorizing Provider  amLODipine (NORVASC) 5 MG tablet Take 5 mg by mouth daily. Pt unsure of dosage    [provider]  cloNIDine (CATAPRES) 0.1 MG tablet Take 0.1 mg by mouth 2 (two) times daily.    [provider]  hydrochlorothiazide (HYDRODIURIL) 12.5 MG tablet Take 12.5 mg by mouth daily.    [provider]  Multiple Vitamin (MULTIVITAMIN WITH MINERALS) TABS tablet Take 1 tablet by mouth every morning.    [provider]  ferrous sulfate 325 (65 FE) MG tablet Take 1 tablet (325 mg total) by mouth 2 (two) times daily with a meal. 02/04/15 02/27/21  Al Corpus, PA-C      Allergies    Benadryl [diphenhydramine hcl] and Penicillins    Review of Systems   Review of Systems  Constitutional:  Positive for fever.  Respiratory:  Negative for shortness of breath.   Gastrointestinal:  Positive for abdominal pain.  Genitourinary:  Negative for flank pain.  Neurological:  Negative for weakness.   Physical Exam Updated Vital Signs BP 128/81   Pulse 99   Temp 99.8 F (37.7 C)   Resp 18   Ht '5\' 3"'$  (1.6 m)   Wt 69.9 kg   SpO2 100%   BMI 27.28 kg/m  Physical Exam Vitals and nursing note reviewed.  HENT:     Head: Atraumatic.  Abdominal:     Tenderness: There is abdominal tenderness.     Comments: Right lower quadrant tenderness.  No  rebound or guarding.  No hernia palpated.  Gravid lower abdomen.  Musculoskeletal:        General: No tenderness.     Cervical back: Neck supple.  Skin:    General: Skin is warm.     Capillary Refill: Capillary refill takes less than 2 seconds.  Neurological:     Mental Status: She is alert and oriented to person, place, and time.    ED Results / Procedures / Treatments   Labs (all labs ordered are listed, but only abnormal results are displayed) Labs Reviewed  URINALYSIS, ROUTINE W REFLEX MICROSCOPIC - Abnormal; Notable for the following components:      Result Value   Color, Urine ORANGE (*)    Hgb urine dipstick TRACE (*)    Bilirubin Urine SMALL (*)    Protein, ur 30 (*)    All other components within normal limits  CBC WITH DIFFERENTIAL/PLATELET - Abnormal; Notable for the following components:   RBC 3.04 (*)    Hemoglobin 9.3 (*)    HCT 26.8 (*)    Lymphs Abs 0.3 (*)    All other components within normal limits  COMPREHENSIVE METABOLIC PANEL - Abnormal; Notable for the following components:   Sodium 130 (*)    Potassium 3.4 (*)    CO2 19 (*)  Glucose, Bld 125 (*)    Calcium 8.6 (*)    Albumin 2.7 (*)    All other components within normal limits  URINALYSIS, MICROSCOPIC (REFLEX) - Abnormal; Notable for the following components:   Bacteria, UA RARE (*)    All other components within normal limits  SARS CORONAVIRUS 2 BY RT PCR  CULTURE, BLOOD (ROUTINE X 2)  CULTURE, BLOOD (ROUTINE X 2)  LACTIC ACID, PLASMA  LACTIC ACID, PLASMA    EKG None  Radiology No results found.  Procedures Procedures    Medications Ordered in ED Medications  acetaminophen (TYLENOL) tablet 650 mg (650 mg Oral Given 03/01/22 1913)  sodium chloride 0.9 % bolus 1,000 mL (0 mLs Intravenous Stopped 03/01/22 2124)    ED Course/ Medical Decision Making/ A&P                           Medical Decision Making Amount and/or Complexity of Data Reviewed Labs: ordered.  Risk OTC  drugs.  Patient is pregnant.  Approximately 13 weeks.  Has reported confirmed intrauterine pregnancy.  Previous history of Crohn's disease but states this feels different.  Temperature of 103 upon arrival.  No cough.  No URI symptoms.  Has had some urinary symptoms.  Urine does show bacteria but not definite infection.  Lactic acid reassuring and white count reassuring but continued pain.  With continued pain and the fever I think she needs further imaging.  With left lower quadrant pain and tenderness particularly ultrasound would probably not help.  Discussed with Dr. Alesia Richards who is covering for her OB with [facility].  Will get MRI at [facility].  Discussed with Dr. Stark Jock who accepted the patient in transfer.  Patient will need MRI of the abdomen and pelvis for evaluation of left lower quadrant pain upon arrival to [facility].         Final Clinical Impression(s) / ED Diagnoses Final diagnoses:  Pregnancy, unspecified gestational age  Fever, unspecified fever cause  Left lower quadrant abdominal pain    Rx / DC Orders ED Discharge Orders     None         Davonna Belling, MD 03/01/22 2334

## 2022-03-02 ENCOUNTER — Inpatient Hospital Stay (HOSPITAL_COMMUNITY): Payer: 59

## 2022-03-02 ENCOUNTER — Encounter: Payer: Self-pay | Admitting: Advanced Practice Midwife

## 2022-03-02 DIAGNOSIS — Z3A13 13 weeks gestation of pregnancy: Secondary | ICD-10-CM

## 2022-03-02 DIAGNOSIS — R509 Fever, unspecified: Secondary | ICD-10-CM

## 2022-03-02 LAB — CBC WITH DIFFERENTIAL/PLATELET
Abs Immature Granulocytes: 0.02 10*3/uL (ref 0.00–0.07)
Basophils Absolute: 0 10*3/uL (ref 0.0–0.1)
Basophils Relative: 0 %
Eosinophils Absolute: 0 10*3/uL (ref 0.0–0.5)
Eosinophils Relative: 0 %
HCT: 27.6 % — ABNORMAL LOW (ref 36.0–46.0)
Hemoglobin: 9.6 g/dL — ABNORMAL LOW (ref 12.0–15.0)
Immature Granulocytes: 0 %
Lymphocytes Relative: 9 %
Lymphs Abs: 0.5 10*3/uL — ABNORMAL LOW (ref 0.7–4.0)
MCH: 31 pg (ref 26.0–34.0)
MCHC: 34.8 g/dL (ref 30.0–36.0)
MCV: 89 fL (ref 80.0–100.0)
Monocytes Absolute: 0.4 10*3/uL (ref 0.1–1.0)
Monocytes Relative: 8 %
Neutro Abs: 4.8 10*3/uL (ref 1.7–7.7)
Neutrophils Relative %: 83 %
Platelets: 275 10*3/uL (ref 150–400)
RBC: 3.1 MIL/uL — ABNORMAL LOW (ref 3.87–5.11)
RDW: 12.4 % (ref 11.5–15.5)
WBC: 5.8 10*3/uL (ref 4.0–10.5)
nRBC: 0 % (ref 0.0–0.2)

## 2022-03-02 LAB — URINALYSIS, ROUTINE W REFLEX MICROSCOPIC
Bilirubin Urine: NEGATIVE
Glucose, UA: NEGATIVE mg/dL
Hgb urine dipstick: NEGATIVE
Ketones, ur: 20 mg/dL — AB
Leukocytes,Ua: NEGATIVE
Nitrite: NEGATIVE
Protein, ur: NEGATIVE mg/dL
Specific Gravity, Urine: 1.021 (ref 1.005–1.030)
pH: 5 (ref 5.0–8.0)

## 2022-03-02 LAB — AMYLASE: Amylase: 60 U/L (ref 28–100)

## 2022-03-02 LAB — LIPASE, BLOOD: Lipase: 32 U/L (ref 11–51)

## 2022-03-02 IMAGING — DX DG CHEST 1V PORT
1 series · 1 of 1 positions shown · non-contrast
Comparison: [DATE].

CLINICAL DATA: Body aches.

EXAM:
PORTABLE CHEST 1 VIEW

[chest]
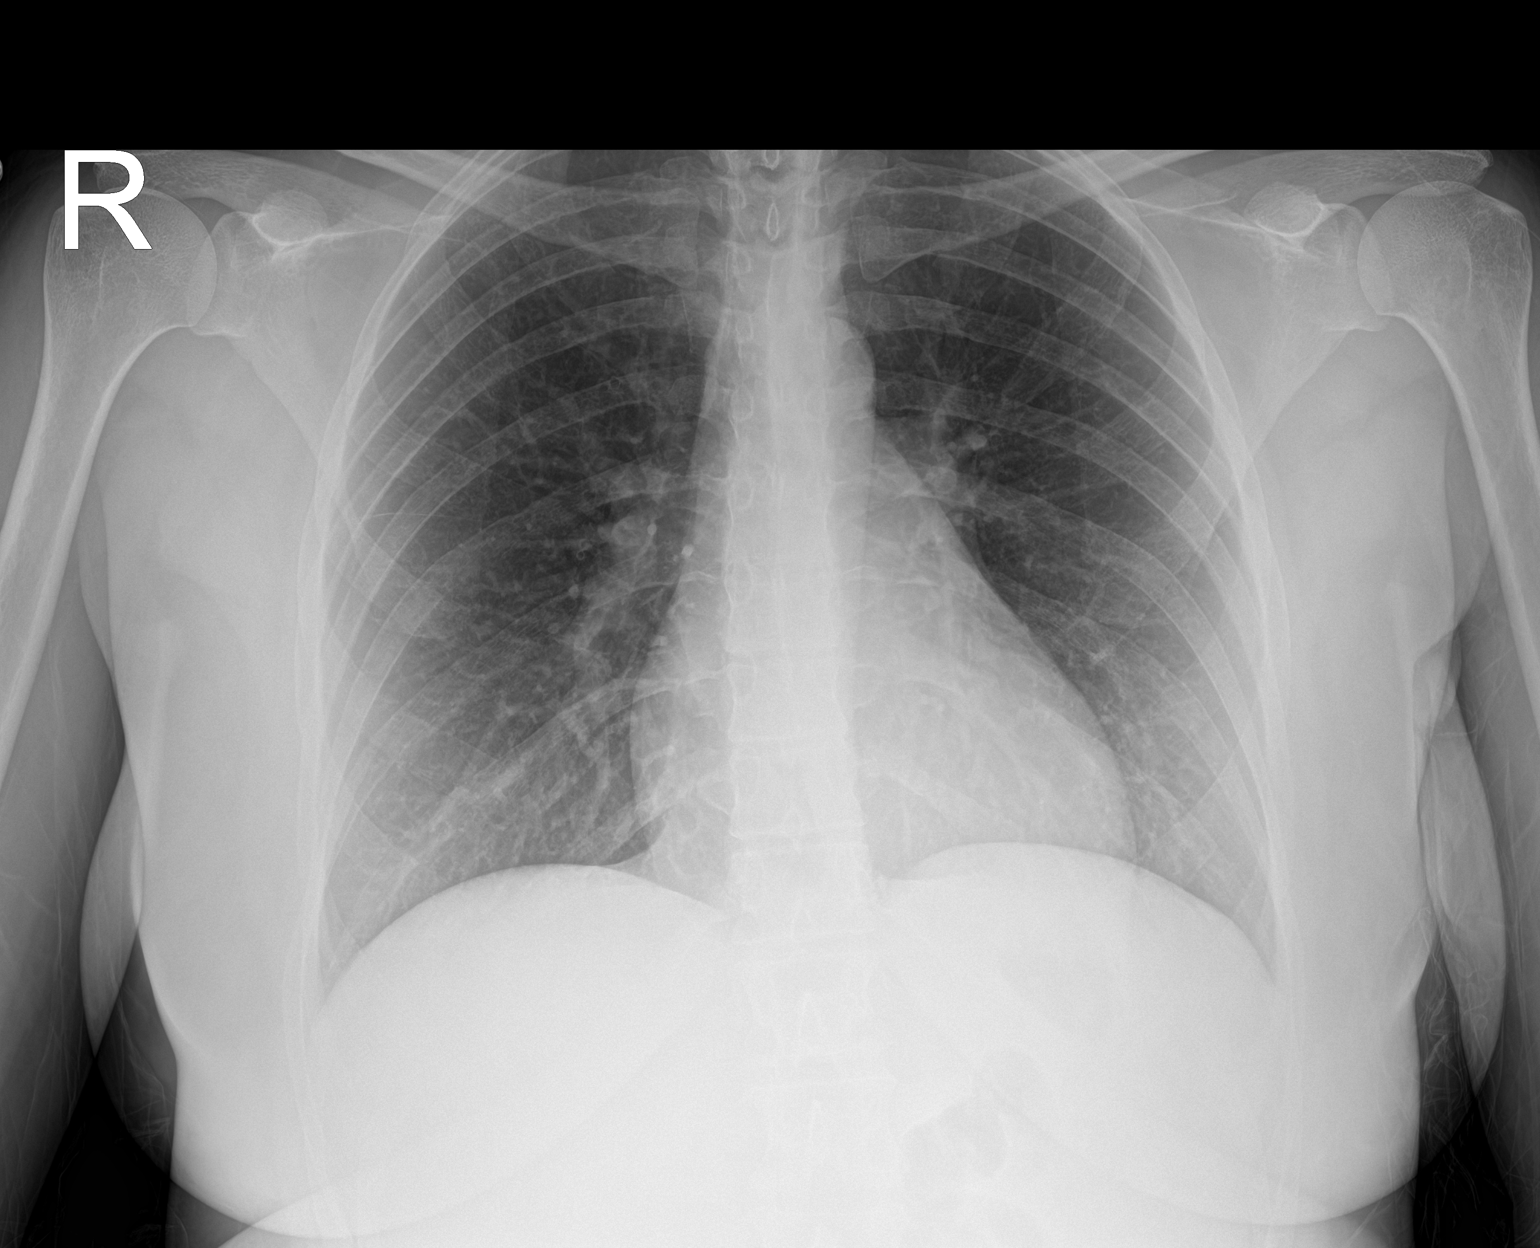

[1 of 1 positions shown; findings below may reference images not displayed]

FINDINGS: The heart size and mediastinal contours are within normal limits.
Both lungs are clear. The visualized skeletal structures are
unremarkable.
IMPRESSION: No active disease.

## 2022-03-02 IMAGING — MR MR PELVIS W/O CM
23 of 26 series · 41 of 48 positions shown · non-contrast
Comparison: None Available.

CLINICAL DATA: 30-year-old pregnant female with history of fever
and body aches. Top no priors.

EXAM:
MRI ABDOMEN AND PELVIS WITHOUT CONTRAST
TECHNIQUE: Multiplanar multisequence MR imaging of the abdomen and pelvis was
performed. No intravenous contrast was administered.

[Series 3: cor haste · coronal · 5.0mm · 1.15mm/px · 1 of 42 slices shown]
[im 1/42]
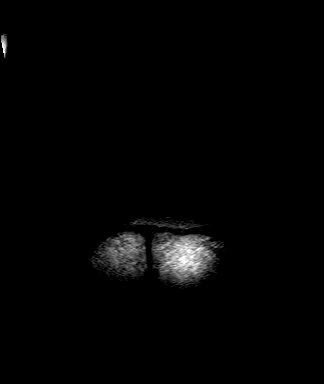

[Series 4: cor haste fs · coronal · 5.0mm · 1.20mm/px · 1 of 41 slices shown]
[im 1/41]
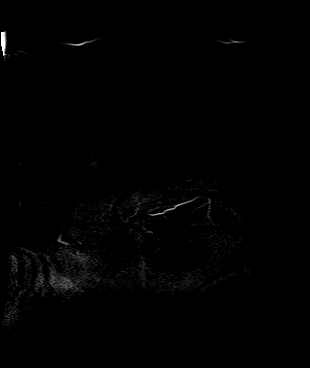

[Series 7: bSSFP · coronal · 5.0mm · 2.01mm/px · 1 of 42 slices shown (1 of 5)]
[im 1/42]
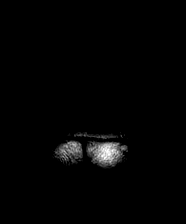

[Series 8: ax haste · axial · 5.0mm · 0.99mm/px · 1 of 42 slices shown (1 of 2)]
[im 1/42]
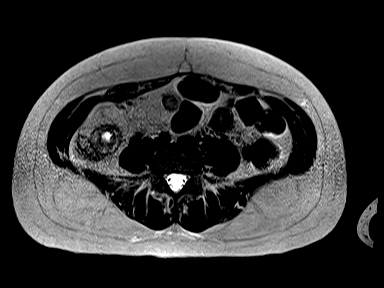

[Series 11: ax haste · axial · 5.0mm · 1.19mm/px · z∈[-234,+0]mm · 2 of 40 slices shown (2 of 2)]
[im 1/40]
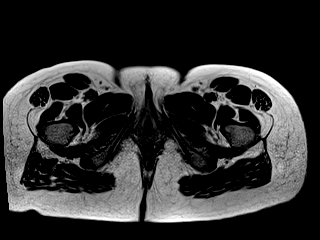
[im 40/40]
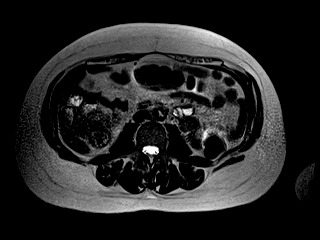

[Series 12: ax haste_comp · axial · 5.0mm · 0.99mm/px · z∈[-20,+196]mm · 2 of 37 slices shown (1 of 2)]
[im 1/37]
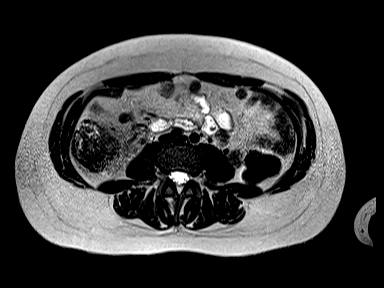
[im 37/37]
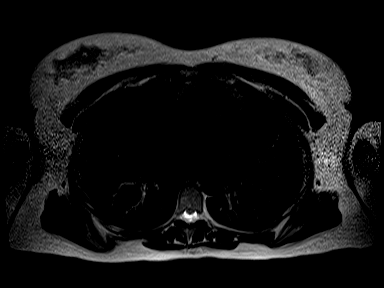

[Series 12: ax haste_comp · axial · 5.0mm · 1.19mm/px · 1 of 35 slices shown (2 of 2)]
[im 1/35]
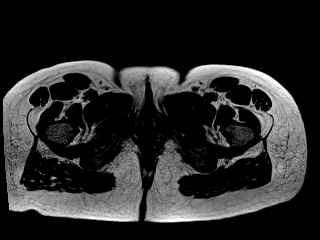

[Series 13: ax haste fs · axial · 5.0mm · 0.99mm/px · z∈[-50,+196]mm · 2 of 42 slices shown (1 of 2)]
[im 1/42]
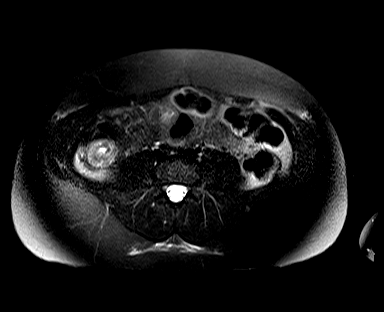
[im 42/42]
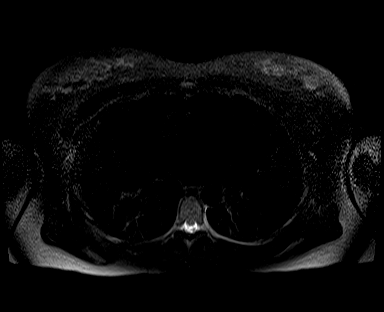

[Series 16: ax haste fs · axial · 5.0mm · 1.19mm/px · z∈[-240,+6]mm · 2 of 42 slices shown (2 of 2)]
[im 1/42]
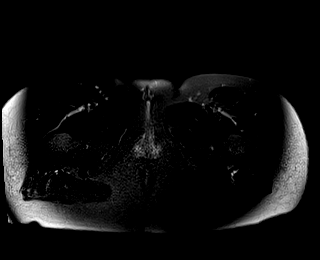
[im 42/42]
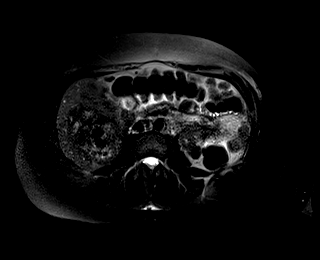

[Series 17: ax haste fs_comp · axial · 5.0mm · 0.99mm/px · z∈[-20,+196]mm · 2 of 37 slices shown (1 of 2)]
[im 1/37]
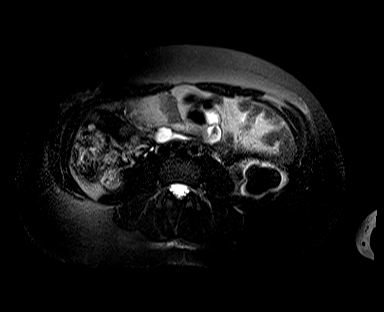
[im 37/37]
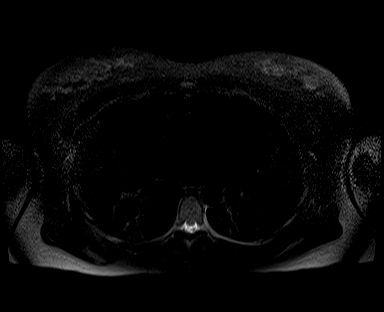

[Series 17: ax haste fs_comp · axial · 5.0mm · 1.19mm/px · z∈[-240,-24]mm · 2 of 37 slices shown (2 of 2)]
[im 1/37]
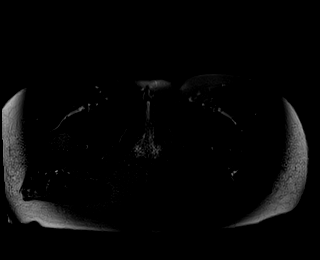
[im 37/37]
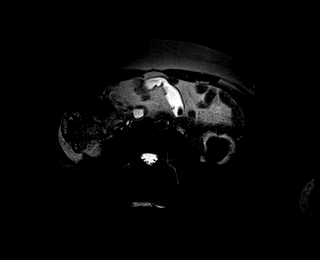

[Series 20: T2 fat-sat · axial · 5.0mm · 1.19mm/px · z∈[-50,+196]mm · 2 of 42 slices shown (1 of 3)]
[im 1/42]
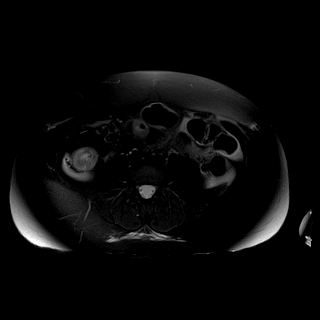
[im 42/42]
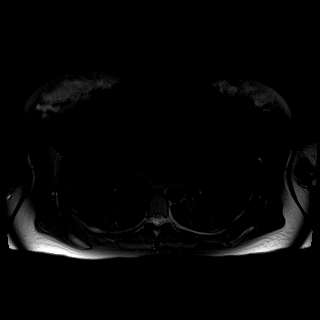

[Series 23: T2 fat-sat · axial · 5.0mm · 1.19mm/px · z∈[-234,+0]mm · 2 of 40 slices shown (2 of 3)]
[im 1/40]
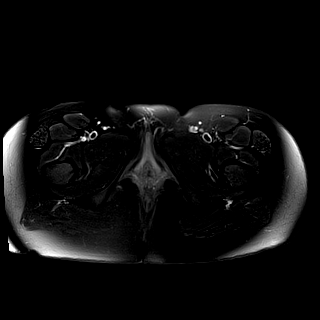
[im 40/40]
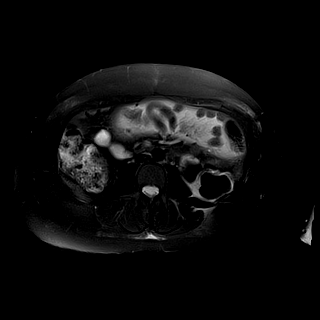

[Series 24: T2 fat-sat · axial · 5.0mm · 1.19mm/px · z∈[-234,+196]mm · 3 of 72 slices shown (3 of 3)]
[im 1/72]
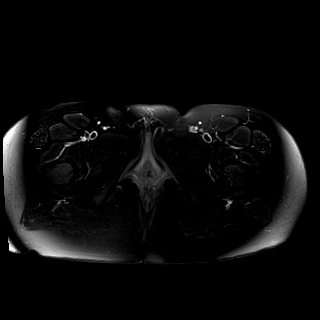
[im 36/72]
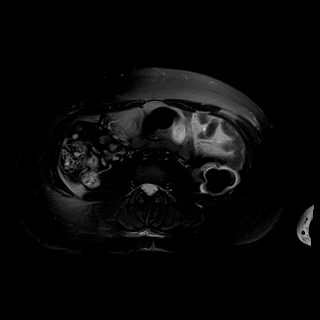
[im 72/72]
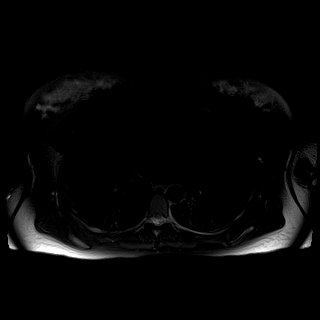

[Series 25: bSSFP · axial · 5.0mm · 1.70mm/px · z∈[-43,+203]mm · 2 of 42 slices shown (2 of 5)]
[im 1/42]
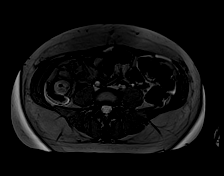
[im 42/42]
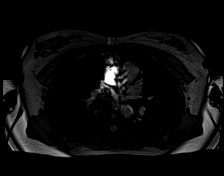

[Series 26: bSSFP · axial · 5.0mm · 0.74mm/px · z∈[-248,-2]mm · 2 of 42 slices shown (3 of 5)]
[im 1/42]
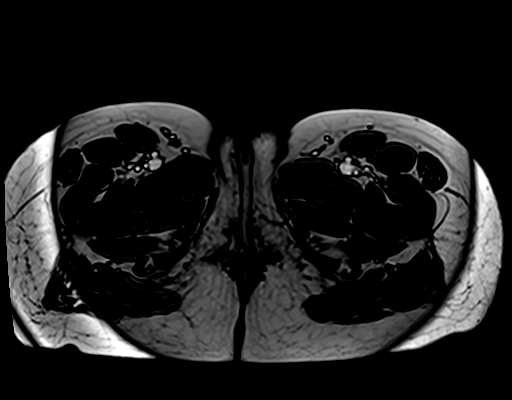
[im 42/42]
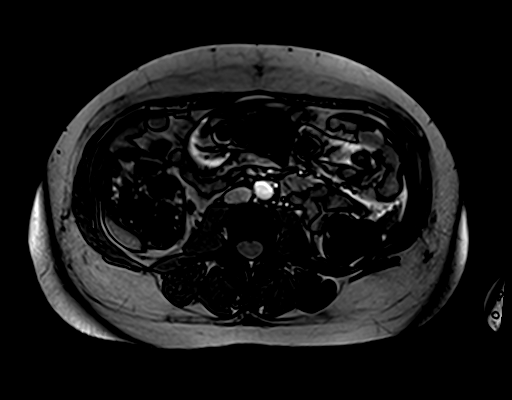

[Series 27: bSSFP · axial · 5.0mm · 0.74mm/px · z∈[-248,-26]mm · 2 of 38 slices shown (4 of 5)]
[im 1/38]
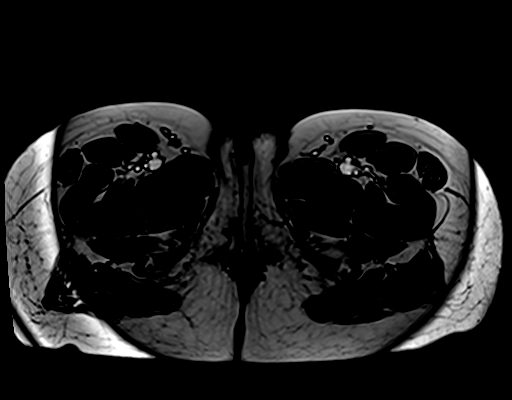
[im 38/38]
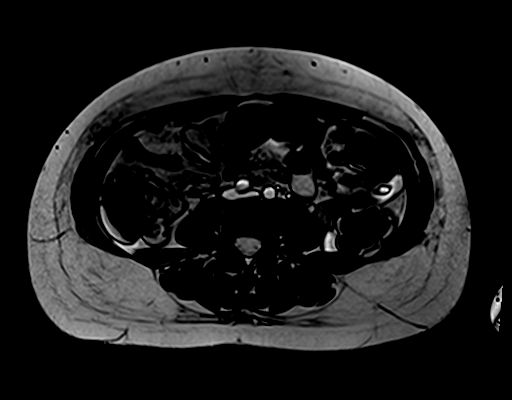

[Series 27: bSSFP · axial · 5.0mm · 1.70mm/px · z∈[-19,+203]mm · 2 of 38 slices shown (5 of 5)]
[im 1/38]
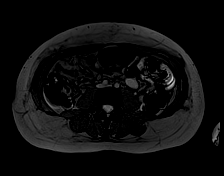
[im 38/38]
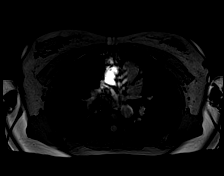

[Series 28: T1 · axial · 5.0mm · 0.78mm/px · z∈[-57,+207]mm · 2 of 45 slices shown (1 of 4)]
[im 1/45]
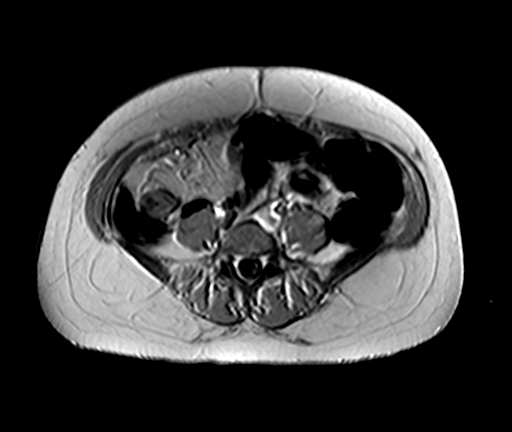
[im 45/45]
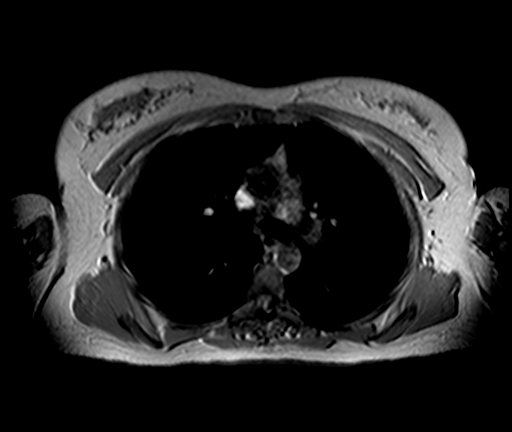

[Series 29: T1 · axial · 5.0mm · 1.56mm/px · z∈[-249,-15]mm · 2 of 40 slices shown (2 of 4)]
[im 1/40]
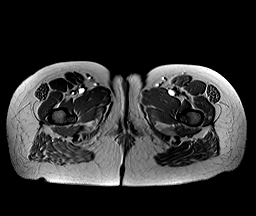
[im 40/40]
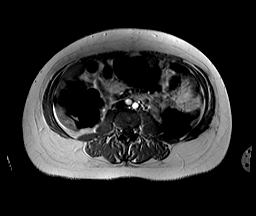

[Series 30: T1 · axial · 5.0mm · 0.78mm/px · z∈[-33,+207]mm · 2 of 41 slices shown (3 of 4)]
[im 1/41]
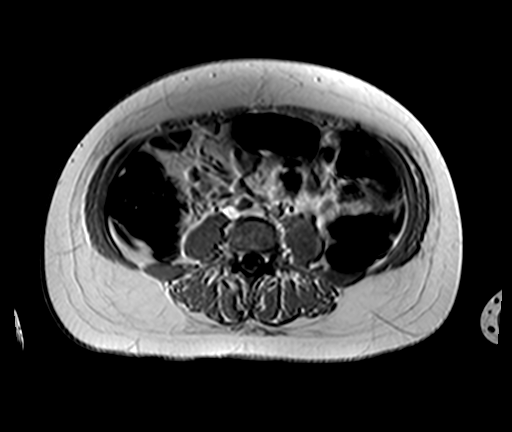
[im 41/41]
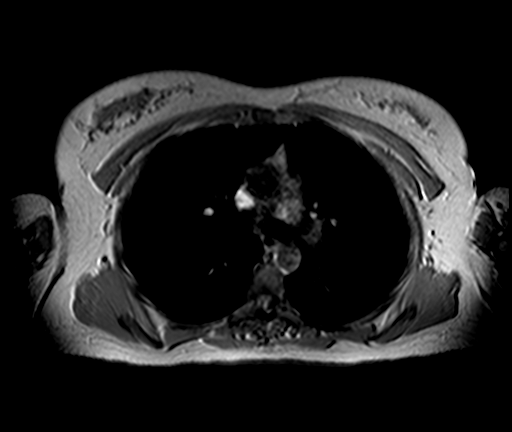

[Series 30: T1 · axial · 5.0mm · 1.56mm/px · z∈[-249,-39]mm · 2 of 36 slices shown (4 of 4)]
[im 1/36]
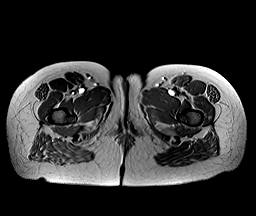
[im 36/36]
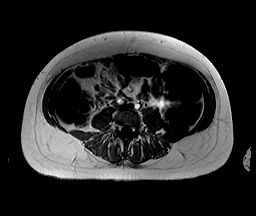

[Series 31: T1 dynamic · axial · 3.0mm · 1.41mm/px · 1 of 80 slices shown]
[im 1/80]
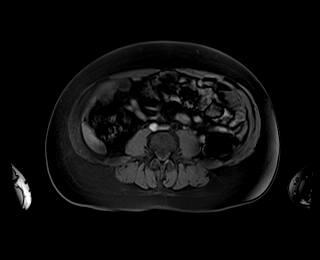

[41 of 48 positions shown; findings below may reference images not displayed]

FINDINGS: COMBINED FINDINGS FOR BOTH MR ABDOMEN AND PELVIS

Comment: Today's study is limited for detection and characterization
of visceral and/or vascular lesions by lack of IV gadolinium.

Lower chest: Unremarkable.

Hepatobiliary: No suspicious cystic or solid hepatic lesions are
confidently identified on today's noncontrast examination. No intra
or extrahepatic biliary ductal dilatation. Gallbladder is normal in
appearance.

Pancreas: No definite pancreatic mass or peripancreatic fluid
collections or inflammatory changes are noted on today's noncontrast
CT examination.

Spleen:  Unremarkable.

Adrenals/Urinary Tract: Bilateral kidneys and adrenal glands are
normal in appearance. No hydroureteronephrosis. Urinary bladder is
nearly completely decompressed, but otherwise unremarkable in
appearance.

Stomach/Bowel: The appearance of the stomach is grossly
unremarkable. No definite dilatation of small bowel or colon. The
appendix is not confidently identified may be surgically absent.
However, there is mural thickening in the distal and terminal ileum,
concerning for active inflammatory bowel disease such as Crohn's
enteritis. No other definitive areas of mural thickening are noted
in the small bowel or colon on today's magnetic resonance
examination.

Vascular/Lymphatic: No aneurysm identified in the visualized
abdominal vasculature. No lymphadenopathy noted in the abdomen.

Reproductive: Gravid uterus with single IUP. Placenta is located
anterolaterally on the left. Ovaries are unremarkable in appearance.

Other:  No significant volume of ascites.

Musculoskeletal: No aggressive appearing osseous lesions are noted
in the visualized portions of the skeleton.
IMPRESSION: 1. Severe mural thickening in the distal and terminal ileum
concerning for active Crohn's disease.
2. Appendix is not confidently identified on today's study,
potentially surgically absent.
3. Gravid uterus with single IUP, as above.

## 2022-03-02 IMAGING — MR MR ABDOMEN W/O CM
22 of 25 series · 41 of 48 positions shown · non-contrast
Comparison: None Available.

CLINICAL DATA: 30-year-old pregnant female with history of fever
and body aches. Top no priors.

EXAM:
MRI ABDOMEN AND PELVIS WITHOUT CONTRAST
TECHNIQUE: Multiplanar multisequence MR imaging of the abdomen and pelvis was
performed. No intravenous contrast was administered.

[Series 3: cor haste · coronal · 5.0mm · 1.15mm/px · 1 of 42 slices shown]
[im 1/42]
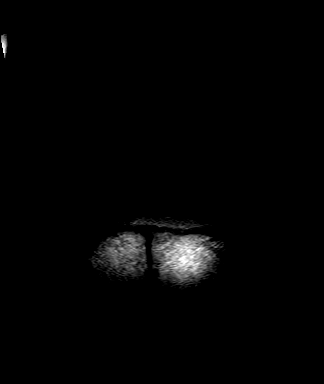

[Series 5: cor haste fs · coronal · 5.0mm · 1.20mm/px · 1 of 41 slices shown]
[im 1/41]
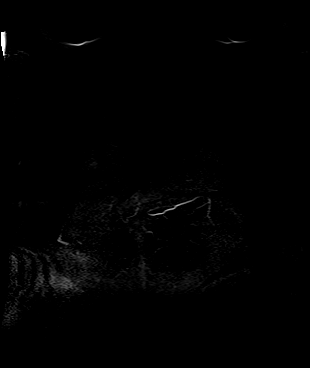

[Series 7: bSSFP · coronal · 5.0mm · 2.01mm/px · 1 of 42 slices shown (1 of 5)]
[im 1/42]
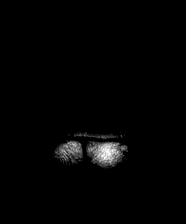

[Series 9: ax haste · axial · 5.0mm · 0.99mm/px · 1 of 42 slices shown (1 of 2)]
[im 1/42]
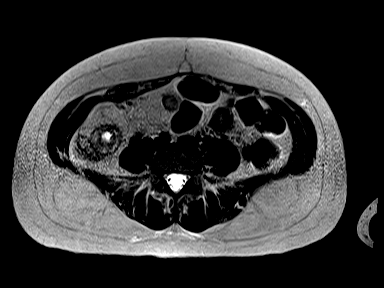

[Series 11: ax haste · axial · 5.0mm · 1.19mm/px · z∈[-234,+0]mm · 2 of 40 slices shown (2 of 2)]
[im 1/40]
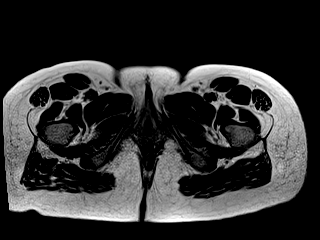
[im 40/40]
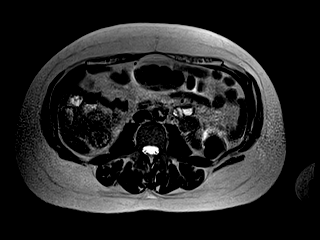

[Series 12: ax haste_comp · axial · 5.0mm · 1.19mm/px · z∈[-234,-30]mm · 2 of 35 slices shown (1 of 2)]
[im 1/35]
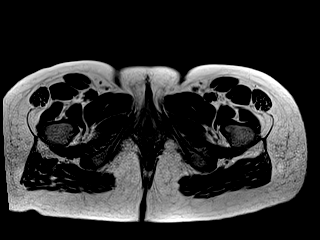
[im 35/35]
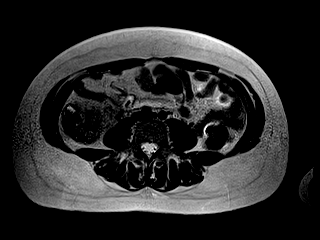

[Series 12: ax haste_comp · axial · 5.0mm · 0.99mm/px · z∈[-20,+196]mm · 2 of 37 slices shown (2 of 2)]
[im 1/37]
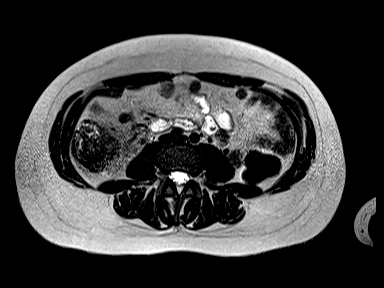
[im 37/37]
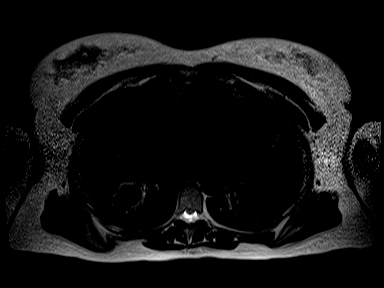

[Series 14: ax haste fs · axial · 5.0mm · 0.99mm/px · z∈[-50,+196]mm · 2 of 42 slices shown (1 of 2)]
[im 1/42]
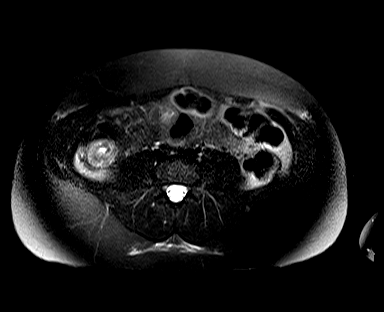
[im 42/42]
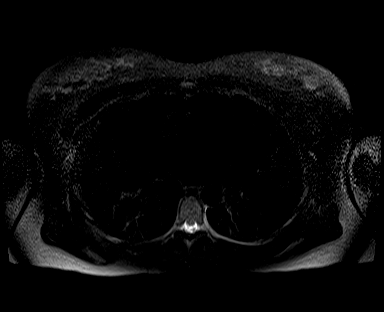

[Series 16: ax haste fs · axial · 5.0mm · 1.19mm/px · z∈[-240,+6]mm · 2 of 41 slices shown (2 of 2)]
[im 1/41]
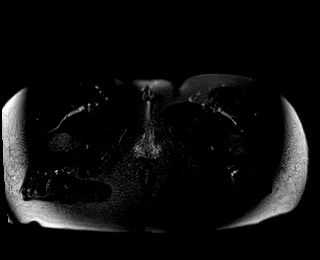
[im 41/41]
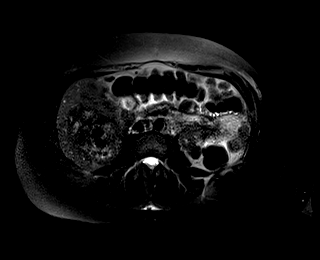

[Series 17: ax haste fs_comp · axial · 5.0mm · 0.99mm/px · z∈[-20,+196]mm · 2 of 37 slices shown (1 of 2)]
[im 1/37]
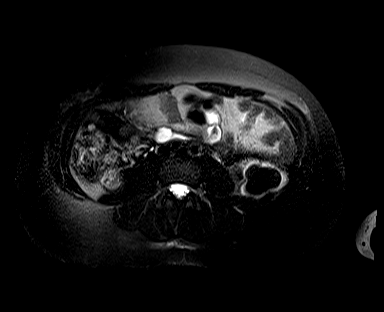
[im 37/37]
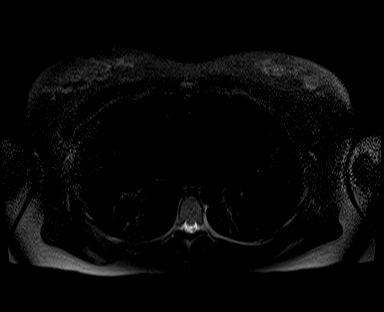

[Series 17: ax haste fs_comp · axial · 5.0mm · 1.19mm/px · z∈[-240,-24]mm · 2 of 37 slices shown (2 of 2)]
[im 1/37]
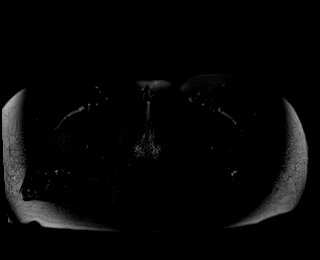
[im 37/37]
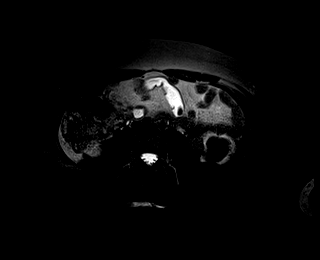

[Series 20: T2 fat-sat · axial · 5.0mm · 1.19mm/px · z∈[-50,+196]mm · 2 of 42 slices shown (1 of 2)]
[im 1/42]
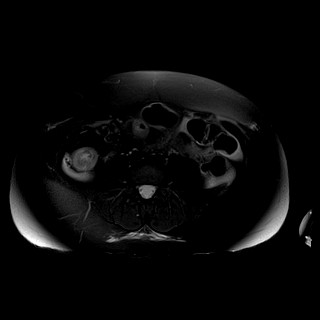
[im 42/42]
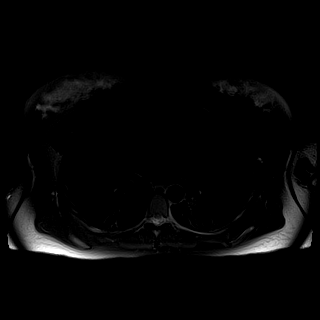

[Series 23: T2 fat-sat · axial · 5.0mm · 1.19mm/px · z∈[-234,+0]mm · 2 of 40 slices shown (2 of 2)]
[im 1/40]
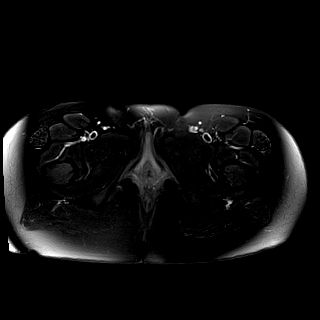
[im 40/40]
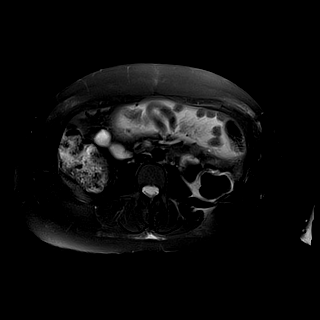

[Series 25: bSSFP · axial · 5.0mm · 1.70mm/px · z∈[-43,+203]mm · 2 of 42 slices shown (2 of 5)]
[im 1/42]
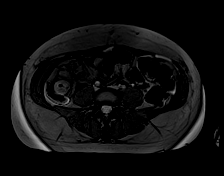
[im 42/42]
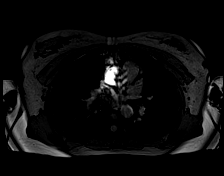

[Series 26: bSSFP · axial · 5.0mm · 0.74mm/px · z∈[-248,-2]mm · 2 of 42 slices shown (3 of 5)]
[im 1/42]
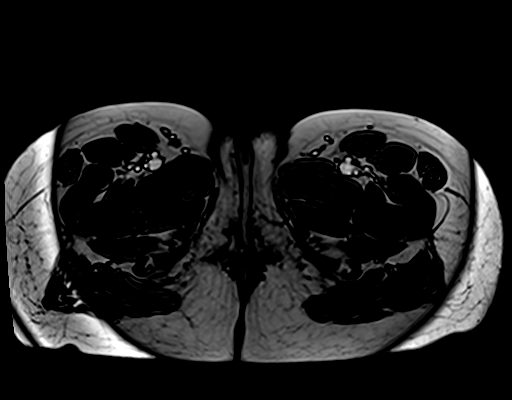
[im 42/42]
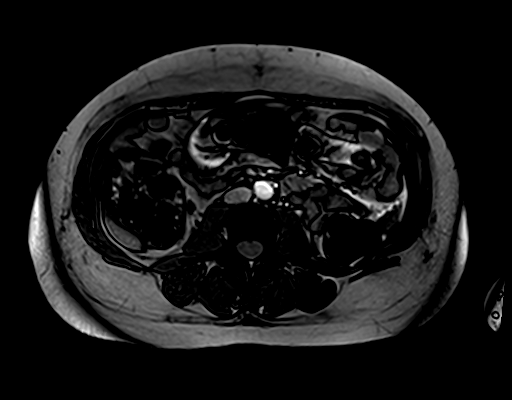

[Series 27: bSSFP · axial · 5.0mm · 0.74mm/px · z∈[-248,-26]mm · 2 of 38 slices shown (4 of 5)]
[im 1/38]
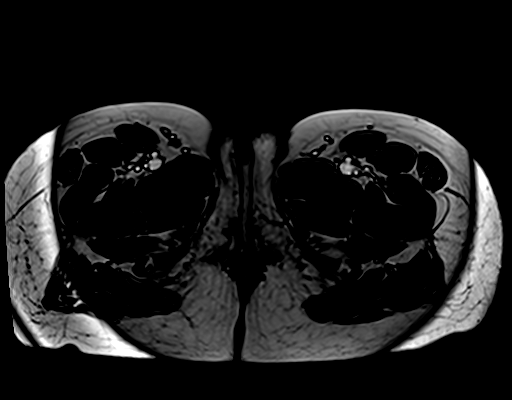
[im 38/38]
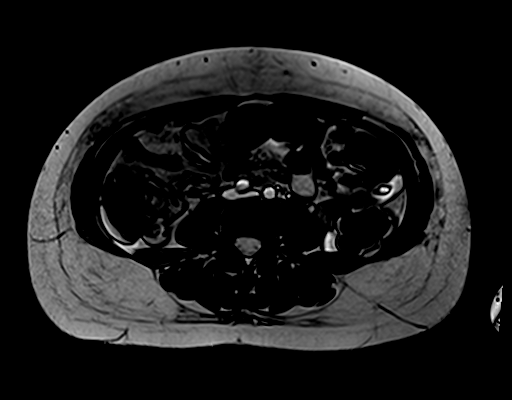

[Series 27: bSSFP · axial · 5.0mm · 1.70mm/px · z∈[-19,+203]mm · 2 of 38 slices shown (5 of 5)]
[im 1/38]
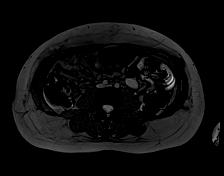
[im 38/38]
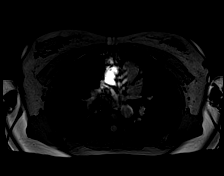

[Series 28: T1 · axial · 5.0mm · 0.78mm/px · z∈[-57,+207]mm · 2 of 45 slices shown (1 of 4)]
[im 1/45]
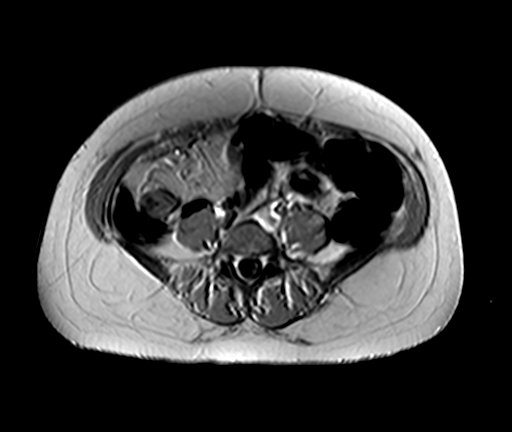
[im 45/45]
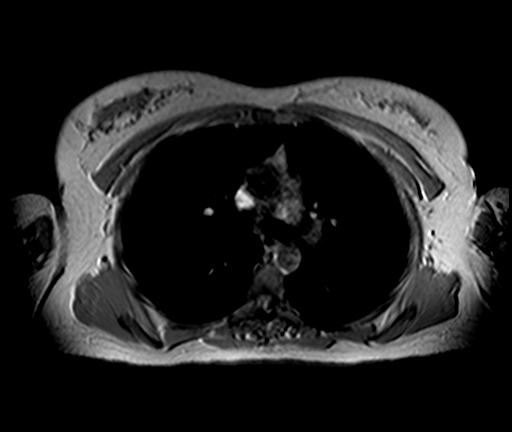

[Series 29: T1 · axial · 5.0mm · 1.56mm/px · z∈[-249,-15]mm · 2 of 40 slices shown (2 of 4)]
[im 1/40]
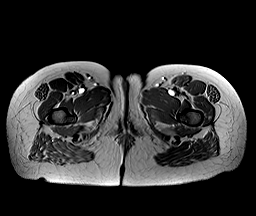
[im 40/40]
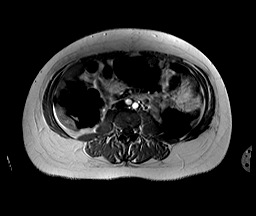

[Series 30: T1 · axial · 5.0mm · 1.56mm/px · z∈[-249,-39]mm · 2 of 36 slices shown (3 of 4)]
[im 1/36]
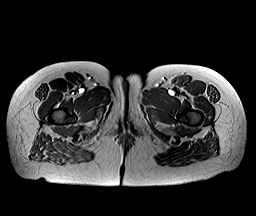
[im 36/36]
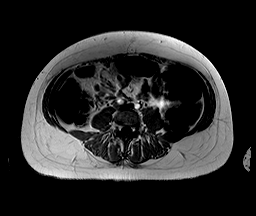

[Series 30: T1 · axial · 5.0mm · 0.78mm/px · z∈[-33,+207]mm · 2 of 41 slices shown (4 of 4)]
[im 1/41]
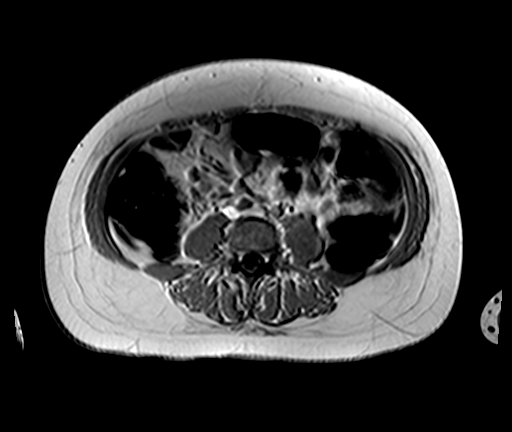
[im 41/41]
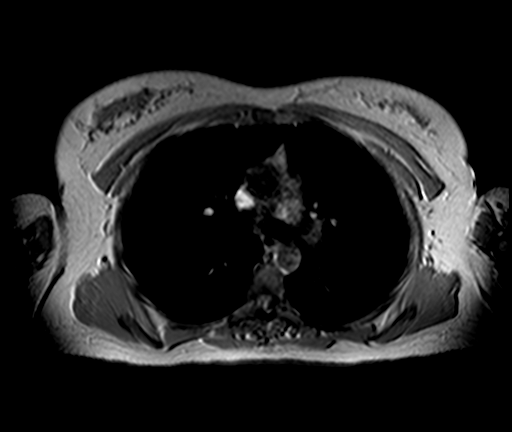

[Series 31: T1 dynamic · axial · 3.0mm · 1.41mm/px · z∈[-23,+133]mm · 3 of 80 slices shown]
[im 1/80]
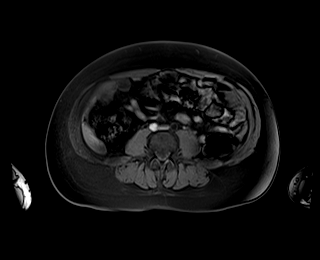
[im 27/80]
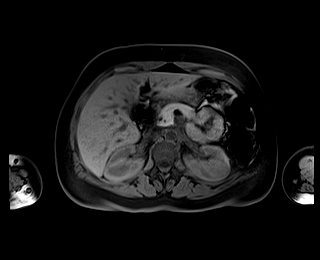
[im 53/80]
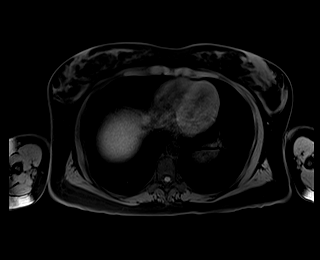

[41 of 48 positions shown; findings below may reference images not displayed]

FINDINGS: COMBINED FINDINGS FOR BOTH MR ABDOMEN AND PELVIS

Comment: Today's study is limited for detection and characterization
of visceral and/or vascular lesions by lack of IV gadolinium.

Lower chest: Unremarkable.

Hepatobiliary: No suspicious cystic or solid hepatic lesions are
confidently identified on today's noncontrast examination. No intra
or extrahepatic biliary ductal dilatation. Gallbladder is normal in
appearance.

Pancreas: No definite pancreatic mass or peripancreatic fluid
collections or inflammatory changes are noted on today's noncontrast
CT examination.

Spleen:  Unremarkable.

Adrenals/Urinary Tract: Bilateral kidneys and adrenal glands are
normal in appearance. No hydroureteronephrosis. Urinary bladder is
nearly completely decompressed, but otherwise unremarkable in
appearance.

Stomach/Bowel: The appearance of the stomach is grossly
unremarkable. No definite dilatation of small bowel or colon. The
appendix is not confidently identified may be surgically absent.
However, there is mural thickening in the distal and terminal ileum,
concerning for active inflammatory bowel disease such as Crohn's
enteritis. No other definitive areas of mural thickening are noted
in the small bowel or colon on today's magnetic resonance
examination.

Vascular/Lymphatic: No aneurysm identified in the visualized
abdominal vasculature. No lymphadenopathy noted in the abdomen.

Reproductive: Gravid uterus with single IUP. Placenta is located
anterolaterally on the left. Ovaries are unremarkable in appearance.

Other:  No significant volume of ascites.

Musculoskeletal: No aggressive appearing osseous lesions are noted
in the visualized portions of the skeleton.
IMPRESSION: 1. Severe mural thickening in the distal and terminal ileum
concerning for active Crohn's disease.
2. Appendix is not confidently identified on today's study,
potentially surgically absent.
3. Gravid uterus with single IUP, as above.

## 2022-03-02 MED ORDER — TRAMADOL HCL 50 MG PO TABS: 50.0000 mg | ORAL_TABLET | Freq: Four times a day (QID) | ORAL | 0 refills | Status: DC | PRN

## 2022-03-02 MED ORDER — ACETAMINOPHEN 500 MG PO TABS
1000.0000 mg | ORAL_TABLET | Freq: Once | ORAL | Status: AC
  Administered 2022-03-02: 1000 mg via ORAL
  Filled 2022-03-02: qty 2

## 2022-03-02 NOTE — ED Notes (Signed)
Gave report to [place] RN via secure chat.

## 2022-03-02 NOTE — MAU Provider Note (Addendum)
Chief Complaint: Generalized Body Aches and Fever   None     SUBJECTIVE HPI: Beverly Richards is a 31 y.o. G1P0 at 98w2dby LMP with confirmed IUP who presents to maternity admissions with RLQ pain and fever. [facility]  [facility] and when she [facility] in was directed to MAU due to her pregnancy.  [facility]  She denies any other symptoms.    HPI  Past Medical History:  Diagnosis Date   Anemia    Anxiety    Crohn's disease ([place]    Hypertension    Monocytosis 10/21/2013   Ovarian cyst    Past Surgical History:  Procedure Laterality Date   COLONOSCOPY N/A 10/23/2013   Procedure: COLONOSCOPY;  Surgeon: JMissy Sabins MD;  Location: [facility];  Service: Endoscopy;  Laterality: N/A;   NO PAST SURGERIES     TONSILLECTOMY     Social History   Socioeconomic History   Marital status: Single    Spouse name: Not on file   Number of children: Not on file   Years of education: Not on file   Highest education level: Not on file  Occupational History   Not on file  Tobacco Use   Smoking status: Never   Smokeless tobacco: Never  Vaping Use   Vaping Use: Never used  Substance and Sexual Activity   Alcohol use: No   Drug use: No   Sexual activity: Not on file  Other Topics Concern   Not on file  Social History Narrative   Not on file   Social Determinants of Health   Financial Resource Strain: Not on file  Food Insecurity: Not on file  Transportation Needs: Not on file  Physical Activity: Not on file  Stress: Not on file  Social Connections: Not on file  Intimate Partner Violence: Not on file   No current facility-administered medications on file prior to encounter.   Current Outpatient Medications on File Prior to Encounter  Medication Sig Dispense Refill    amLODipine (NORVASC) 5 MG tablet Take 5 mg by mouth daily. Pt unsure of dosage     cloNIDine (CATAPRES) 0.1 MG tablet Take 0.1 mg by mouth 2 (two) times daily.     hydrochlorothiazide (HYDRODIURIL) 12.5 MG tablet Take 12.5 mg by mouth daily.     Multiple Vitamin (MULTIVITAMIN WITH MINERALS) TABS tablet Take 1 tablet by mouth every morning.     [DISCONTINUED] ferrous sulfate 325 (65 FE) MG tablet Take 1 tablet (325 mg total) by mouth 2 (two) times daily with a meal. 30 tablet 0   Allergies  Allergen Reactions   Benadryl [Diphenhydramine Hcl] Hives   Penicillins Hives    ROS:  Review of Systems  Constitutional:  Negative for chills, fatigue and fever.  Respiratory:  Negative for shortness of breath.   Cardiovascular:  Negative for chest pain.  Gastrointestinal:  Positive for abdominal pain.  Genitourinary:  Negative for difficulty urinating, dysuria, flank pain, pelvic pain, vaginal bleeding, vaginal discharge and vaginal pain.  Neurological:  Negative for dizziness and headaches.  Psychiatric/Behavioral: Negative.      I have reviewed patient's Past Medical Hx, Surgical Hx, Family Hx, Social Hx, medications and allergies.   Physical Exam  Patient Vitals for the past 24 hrs:  BP  Temp Temp src Pulse Resp SpO2 Height Weight  03/02/22 0708 139/82 (!) 102.2 F (39 C) Oral (!) 119 19 -- -- --  03/02/22 0213 121/76 98.6 F (37 C) Oral 88 18 -- '5\' 3"'$  (1.6 m) 69.9 kg  03/01/22 2359 130/82 98.6 F (37 C) Oral 98 18 99 % -- --  03/01/22 2200 128/81 99.8 F (37.7 C) -- 99 18 100 % -- --  03/01/22 2138 127/78 -- -- 98 18 98 % -- --  03/01/22 2130 127/78 -- -- 98 -- 99 % -- --  03/01/22 2035 129/86 -- -- (!) 109 18 98 % -- --  03/01/22 2000 129/86 -- -- (!) 109 18 100 % -- --  03/01/22 1935 137/87 -- -- (!) 114 18 98 % -- --  03/01/22 1908 -- -- -- -- -- -- '5\' 3"'$  (1.6 m) 69.9 kg  03/01/22 1906 (!) 147/84 (!) 103.3 F (39.6 C) Oral (!) 115 16 98 % -- --   Constitutional:  Well-developed, well-nourished female in no acute distress.  Cardiovascular: normal rate Respiratory: normal effort GI: Abd soft, rebound tenderness in RLQ MS: Extremities nontender, no edema, normal ROM Neurologic: Alert and oriented x 4.  GU: Neg CVAT.  PELVIC EXAM: Deferred  FHT 172 by doppler  LAB RESULTS Results for orders placed or performed during the hospital encounter of 03/01/22 (from the past 24 hour(s))  SARS Coronavirus 2 by RT PCR (hospital order, performed in [facility] hospital lab) *cepheid single result test* Anterior Nasal Swab     Status: None   Collection Time: 03/01/22  7:10 PM   Specimen: Anterior Nasal Swab  Result Value Ref Range   SARS Coronavirus 2 by RT PCR NEGATIVE NEGATIVE  Lactic acid, plasma     Status: None   Collection Time: 03/01/22  8:08 PM  Result Value Ref Range   Lactic Acid, Venous 1.4 0.5 - 1.9 mmol/L  Urinalysis, Routine w reflex microscopic Peripheral     Status: Abnormal   Collection Time: 03/01/22  8:08 PM  Result Value Ref Range   Color, Urine ORANGE (A) YELLOW   APPearance CLEAR CLEAR   Specific Gravity, Urine 1.025 1.005 - 1.030   pH 5.5 5.0 - 8.0   Glucose, UA NEGATIVE NEGATIVE mg/dL   Hgb urine dipstick TRACE (A) NEGATIVE   Bilirubin Urine SMALL (A) NEGATIVE   Ketones, ur NEGATIVE NEGATIVE mg/dL   Protein, ur 30 (A) NEGATIVE mg/dL   Nitrite NEGATIVE NEGATIVE   Leukocytes,Ua NEGATIVE NEGATIVE  CBC with Differential     Status: Abnormal   Collection Time: 03/01/22  8:08 PM  Result Value Ref Range   WBC 6.2 4.0 - 10.5 K/uL   RBC 3.04 (L) 3.87 - 5.11 MIL/uL   Hemoglobin 9.3 (L) 12.0 - 15.0 g/dL   HCT 26.8 (L) 36.0 - 46.0 %   MCV 88.2 80.0 - 100.0 fL   MCH 30.6 26.0 - 34.0 pg   MCHC 34.7 30.0 - 36.0 g/dL   RDW 12.5 11.5 - 15.5 %   Platelets 287 150 - 400 K/uL   nRBC 0.0 0.0 - 0.2 %   Neutrophils Relative % 88 %   Neutro Abs 5.5 1.7 - 7.7 K/uL   Lymphocytes Relative 5 %   Lymphs Abs 0.3 (L) 0.7 - 4.0 K/uL   Monocytes  Relative 6 %   Monocytes Absolute 0.4 0.1 - 1.0 K/uL   Eosinophils Relative 0 %   Eosinophils Absolute 0.0 0.0 - 0.5 K/uL  Basophils Relative 0 %   Basophils Absolute 0.0 0.0 - 0.1 K/uL   Immature Granulocytes 1 %   Abs Immature Granulocytes 0.03 0.00 - 0.07 K/uL  Comprehensive metabolic panel     Status: Abnormal   Collection Time: 03/01/22  8:08 PM  Result Value Ref Range   Sodium 130 (L) 135 - 145 mmol/L   Potassium 3.4 (L) 3.5 - 5.1 mmol/L   Chloride 103 98 - 111 mmol/L   CO2 19 (L) 22 - 32 mmol/L   Glucose, Bld 125 (H) 70 - 99 mg/dL   BUN 11 6 - 20 mg/dL   Creatinine, Ser 0.72 0.44 - 1.00 mg/dL   Calcium 8.6 (L) 8.9 - 10.3 mg/dL   Total Protein 6.9 6.5 - 8.1 g/dL   Albumin 2.7 (L) 3.5 - 5.0 g/dL   AST 28 15 - 41 U/L   ALT 23 0 - 44 U/L   Alkaline Phosphatase 71 38 - 126 U/L   Total Bilirubin 0.7 0.3 - 1.2 mg/dL   GFR, Estimated >60 >60 mL/min   Anion gap 8 5 - 15  Urinalysis, Microscopic (reflex)     Status: Abnormal   Collection Time: 03/01/22  8:08 PM  Result Value Ref Range   RBC / HPF 0-5 0 - 5 RBC/hpf   WBC, UA NONE SEEN 0 - 5 WBC/hpf   Bacteria, UA RARE (A) NONE SEEN   Squamous Epithelial / LPF 0-5 0 - 5  Lactic acid, plasma     Status: None   Collection Time: 03/01/22 11:10 PM  Result Value Ref Range   Lactic Acid, Venous 0.6 0.5 - 1.9 mmol/L       IMAGING MR PELVIS WO CONTRAST  Result Date: 03/02/2022 CLINICAL DATA:  31 year old pregnant female with history of fever and body aches. Top no priors. EXAM: MRI ABDOMEN AND PELVIS WITHOUT CONTRAST TECHNIQUE: Multiplanar multisequence MR imaging of the abdomen and pelvis was performed. No intravenous contrast was administered. COMPARISON:  None Available. FINDINGS: COMBINED FINDINGS FOR BOTH MR ABDOMEN AND PELVIS Comment: Today's study is limited for detection and characterization of visceral and/or vascular lesions by lack of IV gadolinium. Lower chest: Unremarkable. Hepatobiliary: No suspicious cystic or solid  hepatic lesions are confidently identified on today's noncontrast examination. No intra or extrahepatic biliary ductal dilatation. Gallbladder is normal in appearance. Pancreas: No definite pancreatic mass or peripancreatic fluid collections or inflammatory changes are noted on today's noncontrast CT examination. Spleen:  Unremarkable. Adrenals/Urinary Tract: Bilateral kidneys and adrenal glands are normal in appearance. No hydroureteronephrosis. Urinary bladder is nearly completely decompressed, but otherwise unremarkable in appearance. Stomach/Bowel: The appearance of the stomach is grossly unremarkable. No definite dilatation of small bowel or colon. The appendix is not confidently identified may be surgically absent. However, there is mural thickening in the distal and terminal ileum, concerning for active inflammatory bowel disease such as Crohn's enteritis. No other definitive areas of mural thickening are noted in the small bowel or colon on today's magnetic resonance examination. Vascular/Lymphatic: No aneurysm identified in the visualized abdominal vasculature. No lymphadenopathy noted in the abdomen. Reproductive: Gravid uterus with single IUP. Placenta is located anterolaterally on the left. Ovaries are unremarkable in appearance. Other:  No significant volume of ascites. Musculoskeletal: No aggressive appearing osseous lesions are noted in the visualized portions of the skeleton. IMPRESSION: 1. Severe mural thickening in the distal and terminal ileum concerning for active Crohn's disease. 2. Appendix is not confidently identified on today's study, potentially surgically absent. 3. Gravid  uterus with single IUP, as above. Electronically Signed   By: Vinnie Langton M.D.   On: 03/02/2022 06:40   MR ABDOMEN WO CONTRAST  Result Date: 03/02/2022 CLINICAL DATA:  31 year old pregnant female with history of fever and body aches. Top no priors. EXAM: MRI ABDOMEN AND PELVIS WITHOUT CONTRAST TECHNIQUE:  Multiplanar multisequence MR imaging of the abdomen and pelvis was performed. No intravenous contrast was administered. COMPARISON:  None Available. FINDINGS: COMBINED FINDINGS FOR BOTH MR ABDOMEN AND PELVIS Comment: Today's study is limited for detection and characterization of visceral and/or vascular lesions by lack of IV gadolinium. Lower chest: Unremarkable. Hepatobiliary: No suspicious cystic or solid hepatic lesions are confidently identified on today's noncontrast examination. No intra or extrahepatic biliary ductal dilatation. Gallbladder is normal in appearance. Pancreas: No definite pancreatic mass or peripancreatic fluid collections or inflammatory changes are noted on today's noncontrast CT examination. Spleen:  Unremarkable. Adrenals/Urinary Tract: Bilateral kidneys and adrenal glands are normal in appearance. No hydroureteronephrosis. Urinary bladder is nearly completely decompressed, but otherwise unremarkable in appearance. Stomach/Bowel: The appearance of the stomach is grossly unremarkable. No definite dilatation of small bowel or colon. The appendix is not confidently identified may be surgically absent. However, there is mural thickening in the distal and terminal ileum, concerning for active inflammatory bowel disease such as Crohn's enteritis. No other definitive areas of mural thickening are noted in the small bowel or colon on today's magnetic resonance examination. Vascular/Lymphatic: No aneurysm identified in the visualized abdominal vasculature. No lymphadenopathy noted in the abdomen. Reproductive: Gravid uterus with single IUP. Placenta is located anterolaterally on the left. Ovaries are unremarkable in appearance. Other:  No significant volume of ascites. Musculoskeletal: No aggressive appearing osseous lesions are noted in the visualized portions of the skeleton. IMPRESSION: 1. Severe mural thickening in the distal and terminal ileum concerning for active Crohn's disease. 2. Appendix  is not confidently identified on today's study, potentially surgically absent. 3. Gravid uterus with single IUP, as above. Electronically Signed   By: Vinnie Langton M.D.   On: 03/02/2022 06:40    MAU Management/MDM: Orders Placed This Encounter  Procedures   SARS Coronavirus 2 by RT PCR (hospital order, performed in [facility] hospital lab) *cepheid single result test* Anterior Nasal Swab   Culture, blood (routine x 2)   MR ABDOMEN WO CONTRAST   MR PELVIS WO CONTRAST   DG Chest Port 1 View   Lactic acid, plasma   Urinalysis, Routine w reflex microscopic   CBC with Differential   Comprehensive metabolic panel   Urinalysis, Microscopic (reflex)   CBC with Differential/Platelet   Urinalysis, Routine w reflex microscopic   Amylase   Lipase, blood   Airborne and Contact precautions   Insert peripheral IV    Meds ordered this encounter  Medications   acetaminophen (TYLENOL) tablet 650 mg   sodium chloride 0.9 % bolus 1,000 mL   acetaminophen (TYLENOL) tablet 1,000 mg   traMADol (ULTRAM) 50 MG tablet    Sig: Take 1-2 tablets (50-100 mg total) by mouth every 6 (six) hours as needed.    Dispense:  15 tablet    Refill:  0    Order Specific Question:   Supervising Provider    Answer:   CONSTANT, PEGGY [[id number]]    No acute abdomen but pt with rebound tenderness in RLQ.  MRI ordered. Appendix not identified on MRI, but pt reports she still has her appendix Chronic Crohn's disease changes noted, but no cause for fever identified Initially  with no fever x several hours in MAU, planned to d/c patient to home but pt had fever of 102 prior to discharge Consult Dr Constant Repeat WBC count, and discuss with [facility], pt OB providers to consider admitting for observation Called Dr Alwyn Pea who recommended chest x-ray, amylase and lipase, and will likely admit the patient following these results  Report to Maryelizabeth Kaufmann, CNM, with lab results pending    Fatima Blank Certified  Nurse-Midwife 03/02/2022  8:15 AM  MAU Management/MDM Jeronimo Greaves, CNM)  --Additional labs and Chest Xray requested by Dr. Alwyn Pea all WNL --Discussed with Dr. Nelda Marseille, Faculty Attending at [phone]. Plan remains discharge home with strict return precautions --Attempted to reach Dr. Alwyn Pea with results of additional labs and chest Xray. Voicemails left at [phone].  --Voicemail left for [facility] office assistant to Dr. Alwyn Pea requesting alternate phone number at [phone] --Dr. Alwyn Pea reached at [phone], updated report given. Dr. Claudie Leach agreement with plan to discharge home  CBC with Differential     Status: Abnormal   Collection Time: 03/01/22  8:08 PM  Result Value Ref Range   WBC 6.2 4.0 - 10.5 K/uL   RBC 3.04 (L) 3.87 - 5.11 MIL/uL   Hemoglobin 9.3 (L) 12.0 - 15.0 g/dL   HCT 26.8 (L) 36.0 - 46.0 %   MCV 88.2 80.0 - 100.0 fL   MCH 30.6 26.0 - 34.0 pg   MCHC 34.7 30.0 - 36.0 g/dL   RDW 12.5 11.5 - 15.5 %   Platelets 287 150 - 400 K/uL   nRBC 0.0 0.0 - 0.2 %   Neutrophils Relative % 88 %   Neutro Abs 5.5 1.7 - 7.7 K/uL   Lymphocytes Relative 5 %   Lymphs Abs 0.3 (L) 0.7 - 4.0 K/uL   Monocytes Relative 6 %   Monocytes Absolute 0.4 0.1 - 1.0 K/uL   Eosinophils Relative 0 %   Eosinophils Absolute 0.0 0.0 - 0.5 K/uL   Basophils Relative 0 %   Basophils Absolute 0.0 0.0 - 0.1 K/uL   Immature Granulocytes 1 %   Abs Immature Granulocytes 0.03 0.00 - 0.07 K/uL  Comprehensive metabolic panel     Status: Abnormal   Collection Time: 03/01/22  8:08 PM  Result Value Ref Range   Sodium 130 (L) 135 - 145 mmol/L   Potassium 3.4 (L) 3.5 - 5.1 mmol/L   Chloride 103 98 - 111 mmol/L   CO2 19 (L) 22 - 32 mmol/L   Glucose, Bld 125 (H) 70 - 99 mg/dL   BUN 11 6 - 20 mg/dL   Creatinine, Ser 0.72 0.44 - 1.00 mg/dL   Calcium 8.6 (L) 8.9 - 10.3 mg/dL   Total Protein 6.9 6.5 - 8.1 g/dL   Albumin 2.7 (L) 3.5 - 5.0 g/dL   AST 28 15 - 41 U/L   ALT 23 0 - 44 U/L   Alkaline Phosphatase 71 38 - 126 U/L   Total  Bilirubin 0.7 0.3 - 1.2 mg/dL   GFR, Estimated >60 >60 mL/min   Anion gap 8 5 - 15  Urinalysis, Microscopic (reflex)     Status: Abnormal   Collection Time: 03/01/22  8:08 PM  Result Value Ref Range   RBC / HPF 0-5 0 - 5 RBC/hpf   WBC, UA NONE SEEN 0 - 5 WBC/hpf   Bacteria, UA RARE (A) NONE SEEN   Squamous Epithelial / LPF 0-5 0 - 5  Culture, blood (routine x 2)     Status:  None (Preliminary result)   Collection Time: 03/01/22  8:20 PM   Specimen: BLOOD  Result Value Ref Range   Specimen Description      BLOOD RIGHT ANTECUBITAL Performed at [facility], [place] [address], [place], [place] [address]    Special Requests      BOTTLES DRAWN AEROBIC AND ANAEROBIC Blood Culture results may not be optimal due to an excessive volume of blood received in culture bottles Performed at [facility], [address] [place], [place] [address]    Culture      NO GROWTH < 12 HOURS Performed at [facility] [address], [place], [place] [address]    Report Status PENDING   Culture, blood (routine x 2)     Status: None (Preliminary result)   Collection Time: 03/01/22  8:23 PM   Specimen: BLOOD  Result Value Ref Range   Specimen Description      BLOOD LEFT ANTECUBITAL Performed at [facility], [place] [address], [place], [place] [address]    Special Requests      BOTTLES DRAWN AEROBIC AND ANAEROBIC Blood Culture adequate volume Performed at [facility], [address] [place], [place] [address]    Culture      NO GROWTH < 12 HOURS Performed at [facility] [address] [place], [place] [address]    Report Status PENDING   Lactic acid, plasma     Status: None   Collection Time: 03/01/22 11:10 PM  Result Value Ref Range   Lactic Acid, Venous 0.6 0.5 - 1.9 mmol/L  CBC with Differential/Platelet     Status: Abnormal   Collection Time: 03/02/22  8:26 AM  Result Value Ref Range   WBC 5.8 4.0 - 10.5 K/uL   RBC 3.10 (L) 3.87 - 5.11 MIL/uL    Hemoglobin 9.6 (L) 12.0 - 15.0 g/dL   HCT 27.6 (L) 36.0 - 46.0 %   MCV 89.0 80.0 - 100.0 fL   MCH 31.0 26.0 - 34.0 pg   MCHC 34.8 30.0 - 36.0 g/dL   RDW 12.4 11.5 - 15.5 %   Platelets 275 150 - 400 K/uL   nRBC 0.0 0.0 - 0.2 %   Neutrophils Relative % 83 %   Neutro Abs 4.8 1.7 - 7.7 K/uL   Lymphocytes Relative 9 %   Lymphs Abs 0.5 (L) 0.7 - 4.0 K/uL   Monocytes Relative 8 %   Monocytes Absolute 0.4 0.1 - 1.0 K/uL   Eosinophils Relative 0 %   Eosinophils Absolute 0.0 0.0 - 0.5 K/uL   Basophils Relative 0 %   Basophils Absolute 0.0 0.0 - 0.1 K/uL   Immature Granulocytes 0 %   Abs Immature Granulocytes 0.02 0.00 - 0.07 K/uL  Amylase     Status: None   Collection Time: 03/02/22  8:26 AM  Result Value Ref Range   Amylase 60 28 - 100 U/L  Lipase, blood     Status: None   Collection Time: 03/02/22  8:26 AM  Result Value Ref Range   Lipase 32 11 - 51 U/L  Urinalysis, Routine w reflex microscopic Urine, Clean Catch     Status: Abnormal   Collection Time: 03/02/22  8:28 AM  Result Value Ref Range   Color, Urine AMBER (A) YELLOW   APPearance HAZY (A) CLEAR   Specific Gravity, Urine 1.021 1.005 - 1.030   pH 5.0 5.0 - 8.0   Glucose,  UA NEGATIVE NEGATIVE mg/dL   Hgb urine dipstick NEGATIVE NEGATIVE   Bilirubin Urine NEGATIVE NEGATIVE   Ketones, ur 20 (A) NEGATIVE mg/dL   Protein, ur NEGATIVE NEGATIVE mg/dL   Nitrite NEGATIVE NEGATIVE   Leukocytes,Ua NEGATIVE NEGATIVE   MR PELVIS WO CONTRAST  DG Chest Port 1 View  Result Date: 03/02/2022 CLINICAL DATA:  Body aches. EXAM: PORTABLE CHEST 1 VIEW COMPARISON:  September 14, 2021. FINDINGS: The heart size and mediastinal contours are within normal limits. Both lungs are clear. The visualized skeletal structures are unremarkable. IMPRESSION: No active disease. Electronically Signed   By: Marijo Conception M.D.   On: 03/02/2022 08:38    Assessment and Plan:  --31 y.o. G1P0 at [redacted]w[redacted]d --[id number] by Doppler --Fever of unknown origin, suspect  viral illness --All surveillance negative/WNL --Discharge home in stable condition with septic precautions  SMallie Snooks [place] MSN, CNM Certified Nurse Midwife, FRedington-Fairview General Hospitalfor WDean Foods Company CJuncal

## 2022-03-02 NOTE — MAU Note (Addendum)
Pt says was at [facility] at [facility]- for fever - at 1pm-Temp 101-103  And chills-  Gave  Tyl at 530pm, started an IV  Has lower right abd pain - started at 1pm  She went to [facility] ER- was sent  to MAU

## 2022-03-07 LAB — CULTURE, BLOOD (ROUTINE X 2)
Culture: NO GROWTH
Culture: NO GROWTH
Special Requests: ADEQUATE

## 2022-07-24 ENCOUNTER — Inpatient Hospital Stay (HOSPITAL_BASED_OUTPATIENT_CLINIC_OR_DEPARTMENT_OTHER): Payer: 59

## 2022-07-24 ENCOUNTER — Inpatient Hospital Stay (HOSPITAL_COMMUNITY)
Admission: AD | Admit: 2022-07-24 | Discharge: 2022-07-24 | Disposition: A | Discharge status: Home / Self Care | Payer: 59 | Attending: Obstetrics and Gynecology | Admitting: Obstetrics and Gynecology

## 2022-07-24 ENCOUNTER — Encounter (HOSPITAL_COMMUNITY): Payer: Self-pay | Admitting: Obstetrics and Gynecology

## 2022-07-24 DIAGNOSIS — O4693 Antepartum hemorrhage, unspecified, third trimester: Secondary | ICD-10-CM | POA: Insufficient documentation

## 2022-07-24 DIAGNOSIS — O99613 Diseases of the digestive system complicating pregnancy, third trimester: Secondary | ICD-10-CM | POA: Diagnosis not present

## 2022-07-24 DIAGNOSIS — O10913 Unspecified pre-existing hypertension complicating pregnancy, third trimester: Secondary | ICD-10-CM | POA: Insufficient documentation

## 2022-07-24 DIAGNOSIS — N888 Other specified noninflammatory disorders of cervix uteri: Secondary | ICD-10-CM

## 2022-07-24 DIAGNOSIS — K5090 Crohn's disease, unspecified, without complications: Secondary | ICD-10-CM | POA: Diagnosis not present

## 2022-07-24 DIAGNOSIS — Z3A33 33 weeks gestation of pregnancy: Secondary | ICD-10-CM | POA: Insufficient documentation

## 2022-07-24 LAB — URINALYSIS, ROUTINE W REFLEX MICROSCOPIC
Bilirubin Urine: NEGATIVE
Glucose, UA: NEGATIVE mg/dL
Ketones, ur: NEGATIVE mg/dL
Nitrite: NEGATIVE
Protein, ur: 30 mg/dL — AB
Specific Gravity, Urine: 1.025 (ref 1.005–1.030)
pH: 5 (ref 5.0–8.0)

## 2022-07-24 LAB — CBC
HCT: 25.5 % — ABNORMAL LOW (ref 36.0–46.0)
Hemoglobin: 8.6 g/dL — ABNORMAL LOW (ref 12.0–15.0)
MCH: 30.2 pg (ref 26.0–34.0)
MCHC: 33.7 g/dL (ref 30.0–36.0)
MCV: 89.5 fL (ref 80.0–100.0)
Platelets: 265 10*3/uL (ref 150–400)
RBC: 2.85 MIL/uL — ABNORMAL LOW (ref 3.87–5.11)
RDW: 13.3 % (ref 11.5–15.5)
WBC: 9.9 10*3/uL (ref 4.0–10.5)
nRBC: 0 % (ref 0.0–0.2)

## 2022-07-24 NOTE — Discharge Instructions (Signed)

## 2022-07-24 NOTE — MAU Note (Addendum)
...  Beverly Richards is a 31 y.o. at 56w6dhere in MAU reporting: Around 1100 while at work she went to use the restroom and noted blood in the toilet. She reports around thirty minutes later she began experiencing lower abdominal pain that feels "sore" and constant. Denies blood clots. Denies recent IC. Denies LOF. +FM.  Onset of complaint: 1100 Pain score: 7/10 abdomen   FHT: unable to doppler FHT, patient not wearing suit.  Lab orders placed from triage:  UA

## 2022-07-24 NOTE — MAU Provider Note (Addendum)
History     CSN: [id number]  Arrival date and time: 07/24/22 1209   Event Date/Time   First Provider Initiated Contact with Patient 07/24/22 1257      Chief Complaint  Patient presents with   Vaginal Bleeding   Vaginal Bleeding   Beverly Richards is a 31 y.o. G1P0 at 41w6dwho presents to MAU with chief complaint of heavy vaginal bleeding. Patient states she started spotting about one month ago. She was told to continue to monitor her symptoms and to present to MAU if she experienced heavy bleeding. Today, she was working at [employer] went to the bathroom to void, and saw blood in the water of the toilet. She did not see blood clots and is not seeing blood on her clothing.   Patient also reports "crampy" pain across her lower abdomen. Pain score is 7/10. She wears a maternity belt during her work shifts and finds it to be helpful.  Patient receives care with [facility].  OB History     Gravida  1   Para      Term      Preterm      AB      Living         SAB      IAB      Ectopic      Multiple      Live Births              Past Medical History:  Diagnosis Date   Anemia    Anxiety    Crohn's disease ([place]    Hypertension    Monocytosis 10/21/2013   Ovarian cyst     Past Surgical History:  Procedure Laterality Date   COLONOSCOPY N/A 10/23/2013   Procedure: COLONOSCOPY;  Surgeon: JMissy Sabins MD;  Location: [facility];  Service: Endoscopy;  Laterality: N/A;   NO PAST SURGERIES     TONSILLECTOMY      Family History  Problem Relation Age of Onset   Diabetes Mother    Hyperlipidemia Mother    Diabetes Maternal Grandmother    Hyperlipidemia Maternal Grandmother    Birth defects Paternal Grandmother        Breast Cancer    Social History   Tobacco Use   Smoking status: Never   Smokeless tobacco: Never  Vaping Use   Vaping Use: Never used  Substance Use Topics   Alcohol use: No   Drug use: No    Allergies:  Allergies  Allergen  Reactions   Benadryl [Diphenhydramine Hcl] Hives   Penicillins Hives    Medications Prior to Admission  Medication Sig Dispense Refill Last Dose   amLODipine (NORVASC) 5 MG tablet Take 5 mg by mouth daily. Pt unsure of dosage   07/23/2022   Multiple Vitamin (MULTIVITAMIN WITH MINERALS) TABS tablet Take 1 tablet by mouth every morning.   07/24/2022   cloNIDine (CATAPRES) 0.1 MG tablet Take 0.1 mg by mouth 2 (two) times daily.      hydrochlorothiazide (HYDRODIURIL) 12.5 MG tablet Take 12.5 mg by mouth daily.      traMADol (ULTRAM) 50 MG tablet Take 1-2 tablets (50-100 mg total) by mouth every 6 (six) hours as needed. 15 tablet 0     Review of Systems  Genitourinary:  Positive for vaginal bleeding.  All other systems reviewed and are negative.  Physical Exam   Blood pressure 138/85, pulse 83, temperature 98.3 F (36.8 C), temperature source Oral, resp. rate 15, height 5'  3" (1.6 m), weight 83.1 kg, SpO2 100 %.  Physical Exam Vitals and nursing note reviewed. Exam conducted with a chaperone present.  Constitutional:      Appearance: Normal appearance.  Cardiovascular:     Rate and Rhythm: Normal rate.     Pulses: Normal pulses.  Pulmonary:     Effort: Pulmonary effort is normal.  Abdominal:     Comments: Gravid  Genitourinary:    Comments: Pelvic exam: External genitalia normal, vaginal walls pink and well rugated, cervix visually closed, no lesions noted. Negligible red-tinged discharge visualized on tip of speculum.   Skin:    Capillary Refill: Capillary refill takes less than 2 seconds.  Neurological:     Mental Status: She is alert and oriented to person, place, and time.  Psychiatric:        Mood and Affect: Mood normal.        Behavior: Behavior normal.        Thought Content: Thought content normal.        Judgment: Judgment normal.    MAU Course  Procedures  Results for orders placed or performed during the hospital encounter of 07/24/22 (from the past 24  hour(s))  Urinalysis, Routine w reflex microscopic Urine, Clean Catch     Status: Abnormal   Collection Time: 07/24/22 12:32 PM  Result Value Ref Range   Color, Urine YELLOW YELLOW   APPearance HAZY (A) CLEAR   Specific Gravity, Urine 1.025 1.005 - 1.030   pH 5.0 5.0 - 8.0   Glucose, UA NEGATIVE NEGATIVE mg/dL   Hgb urine dipstick MODERATE (A) NEGATIVE   Bilirubin Urine NEGATIVE NEGATIVE   Ketones, ur NEGATIVE NEGATIVE mg/dL   Protein, ur 30 (A) NEGATIVE mg/dL   Nitrite NEGATIVE NEGATIVE   Leukocytes,Ua MODERATE (A) NEGATIVE   RBC / HPF 0-5 0 - 5 RBC/hpf   WBC, UA 21-50 0 - 5 WBC/hpf   Bacteria, UA RARE (A) NONE SEEN   Squamous Epithelial / LPF 6-10 0 - 5   Mucus PRESENT   CBC     Status: Abnormal   Collection Time: 07/24/22  1:14 PM  Result Value Ref Range   WBC 9.9 4.0 - 10.5 K/uL   RBC 2.85 (L) 3.87 - 5.11 MIL/uL   Hemoglobin 8.6 (L) 12.0 - 15.0 g/dL   HCT 25.5 (L) 36.0 - 46.0 %   MCV 89.5 80.0 - 100.0 fL   MCH 30.2 26.0 - 34.0 pg   MCHC 33.7 30.0 - 36.0 g/dL   RDW 13.3 11.5 - 15.5 %   Platelets 265 150 - 400 K/uL   nRBC 0.0 0.0 - 0.2 %     MDM  --Reactive tracing: baseline 135, mod var, + accels, no decels --Toco: quiet  Orders Placed This Encounter  Procedures   [place] MFM OB Limited   Urinalysis, Routine w reflex microscopic Urine, Clean Catch   CBC   Report given to C. Maryruth Hancock, CNM who assumes care of patient at this time  Mallie Snooks, [place], MSN, CNM 07/24/22 1:28 PM  [place] with no evidence of previa or abruption FHR tracing Cat 1 Patient without pain at this time.  Assessment and Plan   1. Friable cervix   2. [redacted] weeks gestation of pregnancy    -Discharge home in stable condition -Vaginal bleeding precautions discussed -Patient advised to follow-up with OB as scheduled for prenatal care on Monday -Patient may return to MAU as needed or if her condition were to change or worsen  Wende Mott, CNM 07/24/22 3:01 PM

## 2022-07-25 LAB — CULTURE, OB URINE: Culture: NO GROWTH

## 2022-08-01 ENCOUNTER — Encounter (HOSPITAL_COMMUNITY): Payer: Self-pay | Admitting: Obstetrics and Gynecology

## 2022-08-01 ENCOUNTER — Inpatient Hospital Stay (HOSPITAL_COMMUNITY)
Admission: AD | Admit: 2022-08-01 | Discharge: 2022-08-02 | Disposition: A | Discharge status: Home / Self Care | Payer: 59 | Attending: Obstetrics and Gynecology | Admitting: Obstetrics and Gynecology

## 2022-08-01 DIAGNOSIS — O4703 False labor before 37 completed weeks of gestation, third trimester: Secondary | ICD-10-CM

## 2022-08-01 DIAGNOSIS — O23593 Infection of other part of genital tract in pregnancy, third trimester: Secondary | ICD-10-CM | POA: Diagnosis not present

## 2022-08-01 DIAGNOSIS — O26893 Other specified pregnancy related conditions, third trimester: Secondary | ICD-10-CM | POA: Insufficient documentation

## 2022-08-01 DIAGNOSIS — Z79899 Other long term (current) drug therapy: Secondary | ICD-10-CM | POA: Diagnosis not present

## 2022-08-01 DIAGNOSIS — B9689 Other specified bacterial agents as the cause of diseases classified elsewhere: Secondary | ICD-10-CM | POA: Diagnosis not present

## 2022-08-01 DIAGNOSIS — O4693 Antepartum hemorrhage, unspecified, third trimester: Secondary | ICD-10-CM | POA: Insufficient documentation

## 2022-08-01 DIAGNOSIS — O10913 Unspecified pre-existing hypertension complicating pregnancy, third trimester: Secondary | ICD-10-CM | POA: Diagnosis not present

## 2022-08-01 DIAGNOSIS — Z3A35 35 weeks gestation of pregnancy: Secondary | ICD-10-CM | POA: Diagnosis not present

## 2022-08-01 LAB — URINALYSIS, ROUTINE W REFLEX MICROSCOPIC
Bilirubin Urine: NEGATIVE
Glucose, UA: NEGATIVE mg/dL
Ketones, ur: NEGATIVE mg/dL
Nitrite: NEGATIVE
Protein, ur: 30 mg/dL — AB
Specific Gravity, Urine: 1.027 (ref 1.005–1.030)
pH: 5 (ref 5.0–8.0)

## 2022-08-01 LAB — WET PREP, GENITAL
Sperm: NONE SEEN
Trich, Wet Prep: NONE SEEN
WBC, Wet Prep HPF POC: >=10 — AB (ref <10)
Yeast Wet Prep HPF POC: NONE SEEN

## 2022-08-01 MED ORDER — METRONIDAZOLE 1 % EX GEL
Freq: Every day | CUTANEOUS | 0 refills | Status: DC
Start: 2022-08-01 — End: 2022-08-20

## 2022-08-01 MED ORDER — LACTATED RINGERS IV BOLUS
1000.0000 mL | Freq: Once | INTRAVENOUS | Status: AC
Start: 2022-08-01 — End: 2022-08-01
  Administered 2022-08-01: 1000 mL via INTRAVENOUS

## 2022-08-01 NOTE — MAU Note (Signed)
.  Beverly Richards is a 31 y.o. at 75w0dhere in MAU reporting: bright red VB and small clot with wiping and infrequent CTX with back pain started about an hour ago. Pt states she was seen in MAU for spotting last week and was told to come back if VB comes back or is worse.Pt wearing a pad. Pt denies LOF, DFM, abnormal discharge, PIH s/s, recent intercourse, and complications in the pregnancy.  CHTN on Procardia '30mg'$  daily Onset of complaint: 2100 Pain score: 6/10 Vitals:   08/01/22 2208  BP: (!) 139/90  Pulse: 80  Resp: 16  Temp: 97.9 F (36.6 C)  SpO2: 100%     FHT:133 Lab orders placed from triage:  ua

## 2022-08-01 NOTE — MAU Provider Note (Signed)
History     CSN: [id number]  Arrival date and time: 08/01/22 2131   Event Date/Time   First Provider Initiated Contact with Patient 08/01/22 2231      Chief Complaint  Patient presents with   Vaginal Bleeding   Contractions   HPI  Beverly Richards is a 31 y.o. G1P0 at 18w0dwho presents for evaluation of vaginal bleeding. Patient reports she went to the bathroom and saw blood in the toilet when she wipes. She reports since the bleeding, she has been feeling regular cramping. Patient rates the pain as a 7/10 and has not tried anything for the pain. She denies any discharge and leaking of fluid. Denies any constipation, diarrhea or any urinary complaints. Reports normal fetal movement.   OB History     Gravida  1   Para      Term      Preterm      AB      Living         SAB      IAB      Ectopic      Multiple      Live Births              Past Medical History:  Diagnosis Date   Anemia    Anxiety    [place]    Hypertension    Monocytosis 10/21/2013   Ovarian cyst     Past Surgical History:  Procedure Laterality Date   COLONOSCOPY N/A 10/23/2013   Procedure: COLONOSCOPY;  Surgeon: JMissy Sabins MD;  Location: [facility];  Service: Endoscopy;  Laterality: N/A;   NO PAST SURGERIES     TONSILLECTOMY      Family History  Problem Relation Age of Onset   Diabetes Mother    Hyperlipidemia Mother    Diabetes Maternal Grandmother    Hyperlipidemia Maternal Grandmother    Birth defects Paternal Grandmother        Breast Cancer    Social History   Tobacco Use   Smoking status: Never   Smokeless tobacco: Never  Vaping Use   Vaping Use: Never used  Substance Use Topics   Alcohol use: No   Drug use: No    Allergies:  Allergies  Allergen Reactions   Benadryl [Diphenhydramine Hcl] Hives   Penicillins Hives    Medications Prior to Admission  Medication Sig Dispense Refill Last Dose   ferrous sulfate 324 MG TBEC Take 324  mg by mouth.   07/31/2022   NIFEdipine (PROCARDIA-XL/NIFEDICAL-XL) 30 MG 24 hr tablet Take 30 mg by mouth daily.   08/01/2022 at 1900   amLODipine (NORVASC) 5 MG tablet Take 5 mg by mouth daily. Pt unsure of dosage      cloNIDine (CATAPRES) 0.1 MG tablet Take 0.1 mg by mouth 2 (two) times daily.      hydrochlorothiazide (HYDRODIURIL) 12.5 MG tablet Take 12.5 mg by mouth daily.      Multiple Vitamin (MULTIVITAMIN WITH MINERALS) TABS tablet Take 1 tablet by mouth every morning.      traMADol (ULTRAM) 50 MG tablet Take 1-2 tablets (50-100 mg total) by mouth every 6 (six) hours as needed. 15 tablet 0     Review of Systems  Constitutional: Negative.  Negative for fatigue and fever.  HENT: Negative.    Respiratory: Negative.  Negative for shortness of breath.   Cardiovascular: Negative.  Negative for chest pain.  Gastrointestinal:  Positive for abdominal pain. Negative for constipation,  diarrhea, nausea and vomiting.  Genitourinary:  Positive for vaginal discharge. Negative for dysuria.  Neurological: Negative.  Negative for dizziness and headaches.   Physical Exam   Blood pressure 134/78, pulse 89, temperature 97.9 F (36.6 C), temperature source Oral, resp. rate 16, height '5\' 3"'$  (1.6 m), weight 84.5 kg, SpO2 100 %.  Patient Vitals for the past 24 hrs:  BP Temp Temp src Pulse Resp SpO2 Height Weight  08/01/22 2246 134/78 -- -- 89 -- -- -- --  08/01/22 2227 (!) 138/92 -- -- 87 -- 100 % -- --  08/01/22 2208 (!) 139/90 97.9 F (36.6 C) Oral 80 16 100 % '5\' 3"'$  (1.6 m) 84.5 kg    Physical Exam Vitals and nursing note reviewed.  Constitutional:      General: She is not in acute distress.    Appearance: She is well-developed.  HENT:     Head: Normocephalic.  Eyes:     Pupils: Pupils are equal, round, and reactive to light.  Cardiovascular:     Rate and Rhythm: Normal rate and regular rhythm.     Heart sounds: Normal heart sounds.  Pulmonary:     Effort: Pulmonary effort is normal. No  respiratory distress.     Breath sounds: Normal breath sounds.  Abdominal:     General: Bowel sounds are normal. There is no distension.     Palpations: Abdomen is soft.     Tenderness: There is no abdominal tenderness.  Genitourinary:    Comments: Pelvic exam: Cervix pink, visually closed, without lesion, small amount of pink frothy discharge in vault, vaginal walls and external genitalia normal Skin:    General: Skin is warm and dry.  Neurological:     Mental Status: She is alert and oriented to person, place, and time.  Psychiatric:        Mood and Affect: Mood normal.        Behavior: Behavior normal.        Thought Content: Thought content normal.        Judgment: Judgment normal.     Fetal Tracing:  Baseline: 130 Variability: moderate Accels: 15x15 Decels: none  Toco: UI with occasional UC's  Dilation: Closed Effacement (%): Thick Cervical Position: Posterior Exam by:: Len Blalock, CNM   MAU Course  Procedures  Results for orders placed or performed during the hospital encounter of 08/01/22 (from the past 24 hour(s))  Urinalysis, Routine w reflex microscopic     Status: Abnormal   Collection Time: 08/01/22 10:01 PM  Result Value Ref Range   Color, Urine YELLOW YELLOW   APPearance HAZY (A) CLEAR   Specific Gravity, Urine 1.027 1.005 - 1.030   pH 5.0 5.0 - 8.0   Glucose, UA NEGATIVE NEGATIVE mg/dL   Hgb urine dipstick SMALL (A) NEGATIVE   Bilirubin Urine NEGATIVE NEGATIVE   Ketones, ur NEGATIVE NEGATIVE mg/dL   Protein, ur 30 (A) NEGATIVE mg/dL   Nitrite NEGATIVE NEGATIVE   Leukocytes,Ua SMALL (A) NEGATIVE   RBC / HPF 0-5 0 - 5 RBC/hpf   WBC, UA 0-5 0 - 5 WBC/hpf   Bacteria, UA RARE (A) NONE SEEN   Squamous Epithelial / LPF 6-10 0 - 5   Mucus PRESENT   Wet prep, genital     Status: Abnormal   Collection Time: 08/01/22 10:43 PM  Result Value Ref Range   Yeast Wet Prep HPF POC NONE SEEN NONE SEEN   Trich, Wet Prep NONE SEEN NONE SEEN   Clue Cells  Wet Prep HPF POC PRESENT (A) NONE SEEN   WBC, Wet Prep HPF POC >=10 (A) <10   Sperm NONE SEEN      MDM Prenatal records from community office not on file Labs ordered and reviewed.   UA Wet prep  No frank bleeding on exam. Cervix closed/thick  LR Bolus  Patient reports resolution of contractions  Assessment and Plan   1. Preterm uterine contractions in third trimester, antepartum   2. [redacted] weeks gestation of pregnancy   3. Bacterial vaginosis     -Discharge home in stable condition -Rx for metrogel sent to pharmacy -Preterm labor precautions discussed -Patient advised to follow-up with OB as scheduled for prenatal care on Friday -Patient may return to MAU as needed or if her condition were to change or worsen  Wende Mott, CNM 08/01/2022, 11:52 PM

## 2022-08-01 NOTE — Discharge Instructions (Signed)

## 2022-08-03 LAB — CULTURE, OB URINE: Culture: NO GROWTH

## 2022-08-09 ENCOUNTER — Other Ambulatory Visit: Payer: Self-pay | Admitting: Obstetrics and Gynecology

## 2022-08-15 ENCOUNTER — Encounter (HOSPITAL_COMMUNITY): Payer: Self-pay | Admitting: Obstetrics and Gynecology

## 2022-08-15 ENCOUNTER — Inpatient Hospital Stay (HOSPITAL_COMMUNITY)
Admission: RE | Admit: 2022-08-15 | Discharge: 2022-08-20 | DRG: 787 | Disposition: A | Discharge status: Home / Self Care | Payer: 59 | Attending: Obstetrics and Gynecology | Admitting: Obstetrics and Gynecology

## 2022-08-15 ENCOUNTER — Inpatient Hospital Stay (HOSPITAL_COMMUNITY): Payer: 59

## 2022-08-15 DIAGNOSIS — D649 Anemia, unspecified: Secondary | ICD-10-CM | POA: Diagnosis not present

## 2022-08-15 DIAGNOSIS — Z79899 Other long term (current) drug therapy: Secondary | ICD-10-CM

## 2022-08-15 DIAGNOSIS — F329 Major depressive disorder, single episode, unspecified: Secondary | ICD-10-CM | POA: Diagnosis present

## 2022-08-15 DIAGNOSIS — Z3A37 37 weeks gestation of pregnancy: Secondary | ICD-10-CM

## 2022-08-15 DIAGNOSIS — O99344 Other mental disorders complicating childbirth: Secondary | ICD-10-CM | POA: Diagnosis present

## 2022-08-15 DIAGNOSIS — O10919 Unspecified pre-existing hypertension complicating pregnancy, unspecified trimester: Secondary | ICD-10-CM | POA: Diagnosis present

## 2022-08-15 DIAGNOSIS — O164 Unspecified maternal hypertension, complicating childbirth: Secondary | ICD-10-CM | POA: Diagnosis not present

## 2022-08-15 DIAGNOSIS — O9902 Anemia complicating childbirth: Secondary | ICD-10-CM | POA: Diagnosis present

## 2022-08-15 DIAGNOSIS — K5090 Crohn's disease, unspecified, without complications: Secondary | ICD-10-CM | POA: Diagnosis present

## 2022-08-15 DIAGNOSIS — O9962 Diseases of the digestive system complicating childbirth: Secondary | ICD-10-CM | POA: Diagnosis present

## 2022-08-15 DIAGNOSIS — D509 Iron deficiency anemia, unspecified: Secondary | ICD-10-CM | POA: Diagnosis present

## 2022-08-15 DIAGNOSIS — F419 Anxiety disorder, unspecified: Secondary | ICD-10-CM | POA: Diagnosis present

## 2022-08-15 DIAGNOSIS — O1002 Pre-existing essential hypertension complicating childbirth: Secondary | ICD-10-CM | POA: Diagnosis present

## 2022-08-15 DIAGNOSIS — Z3490 Encounter for supervision of normal pregnancy, unspecified, unspecified trimester: Principal | ICD-10-CM

## 2022-08-15 DIAGNOSIS — Z98891 History of uterine scar from previous surgery: Secondary | ICD-10-CM

## 2022-08-15 LAB — CBC
HCT: 27 % — ABNORMAL LOW (ref 36.0–46.0)
HCT: 28.1 % — ABNORMAL LOW (ref 36.0–46.0)
Hemoglobin: 9.1 g/dL — ABNORMAL LOW (ref 12.0–15.0)
Hemoglobin: 9.5 g/dL — ABNORMAL LOW (ref 12.0–15.0)
MCH: 29.8 pg (ref 26.0–34.0)
MCH: 30.3 pg (ref 26.0–34.0)
MCHC: 33.7 g/dL (ref 30.0–36.0)
MCHC: 33.8 g/dL (ref 30.0–36.0)
MCV: 88.5 fL (ref 80.0–100.0)
MCV: 89.5 fL (ref 80.0–100.0)
Platelets: 253 10*3/uL (ref 150–400)
Platelets: 233 10*3/uL (ref 150–400)
RBC: 3.05 MIL/uL — ABNORMAL LOW (ref 3.87–5.11)
RBC: 3.14 MIL/uL — ABNORMAL LOW (ref 3.87–5.11)
RDW: 14.6 % (ref 11.5–15.5)
RDW: 14.7 % (ref 11.5–15.5)
WBC: 6.2 10*3/uL (ref 4.0–10.5)
WBC: 7.4 10*3/uL (ref 4.0–10.5)
nRBC: 0 % (ref 0.0–0.2)
nRBC: 0 % (ref 0.0–0.2)

## 2022-08-15 LAB — PROTEIN / CREATININE RATIO, URINE
Creatinine, Urine: 133 mg/dL
Protein Creatinine Ratio: 0.19 mg/mg{Cre} — ABNORMAL HIGH (ref 0.00–0.15)
Total Protein, Urine: 25 mg/dL

## 2022-08-15 LAB — COMPREHENSIVE METABOLIC PANEL
ALT: 21 U/L (ref 0–44)
AST: 25 U/L (ref 15–41)
Albumin: 2.7 g/dL — ABNORMAL LOW (ref 3.5–5.0)
Alkaline Phosphatase: 79 U/L (ref 38–126)
Anion gap: 14 (ref 5–15)
BUN: 8 mg/dL (ref 6–20)
CO2: 18 mmol/L — ABNORMAL LOW (ref 22–32)
Calcium: 8.8 mg/dL — ABNORMAL LOW (ref 8.9–10.3)
Chloride: 105 mmol/L (ref 98–111)
Creatinine, Ser: 0.65 mg/dL (ref 0.44–1.00)
GFR, Estimated: >60 mL/min (ref >60)
Glucose, Bld: 66 mg/dL — ABNORMAL LOW (ref 70–99)
Potassium: 3.8 mmol/L (ref 3.5–5.1)
Sodium: 137 mmol/L (ref 135–145)
Total Bilirubin: 0.3 mg/dL (ref 0.3–1.2)
Total Protein: 6.2 g/dL — ABNORMAL LOW (ref 6.5–8.1)

## 2022-08-15 LAB — TYPE AND SCREEN
ABO/RH(D): B POS
Antibody Screen: NEGATIVE

## 2022-08-15 LAB — OB RESULTS CONSOLE GBS: GBS: NEGATIVE

## 2022-08-15 LAB — RPR: RPR Ser Ql: NONREACTIVE

## 2022-08-15 MED ORDER — EPHEDRINE 5 MG/ML INJ: 10.0000 mg | INTRAVENOUS | Status: DC | PRN

## 2022-08-15 MED ORDER — FENTANYL-BUPIVACAINE-NACL 0.5-0.125-0.9 MG/250ML-% EP SOLN
12.0000 mL/h | EPIDURAL | Status: DC | PRN
  Administered 2022-08-16: 12 mL/h via EPIDURAL
  Filled 2022-08-15 (×2): qty 250

## 2022-08-15 MED ORDER — LACTATED RINGERS IV SOLN: 500.0000 mL | INTRAVENOUS | Status: DC | PRN

## 2022-08-15 MED ORDER — LACTATED RINGERS IV SOLN
500.0000 mL | Freq: Once | INTRAVENOUS | Status: AC
  Administered 2022-08-16: 500 mL via INTRAVENOUS

## 2022-08-15 MED ORDER — FENTANYL CITRATE (PF) 100 MCG/2ML IJ SOLN
INTRAMUSCULAR | Status: AC
  Administered 2022-08-16: 100 ug via INTRAVENOUS
  Filled 2022-08-15: qty 2

## 2022-08-15 MED ORDER — OXYCODONE-ACETAMINOPHEN 5-325 MG PO TABS: 1.0000 | ORAL_TABLET | ORAL | Status: DC | PRN

## 2022-08-15 MED ORDER — NIFEDIPINE ER OSMOTIC RELEASE 30 MG PO TB24
30.0000 mg | ORAL_TABLET | Freq: Every day | ORAL | Status: DC
  Administered 2022-08-15 – 2022-08-17 (×3): 30 mg via ORAL
  Filled 2022-08-15 (×3): qty 1

## 2022-08-15 MED ORDER — SOD CITRATE-CITRIC ACID 500-334 MG/5ML PO SOLN
30.0000 mL | ORAL | Status: DC | PRN
  Administered 2022-08-17: 30 mL via ORAL
  Filled 2022-08-15: qty 30

## 2022-08-15 MED ORDER — ACETAMINOPHEN 325 MG PO TABS
650.0000 mg | ORAL_TABLET | ORAL | Status: DC | PRN
  Administered 2022-08-16: 650 mg via ORAL
  Filled 2022-08-15: qty 2

## 2022-08-15 MED ORDER — TERBUTALINE SULFATE 1 MG/ML IJ SOLN: 0.2500 mg | Freq: Once | INTRAMUSCULAR | Status: DC | PRN

## 2022-08-15 MED ORDER — PHENYLEPHRINE 80 MCG/ML (10ML) SYRINGE FOR IV PUSH (FOR BLOOD PRESSURE SUPPORT): 80.0000 ug | PREFILLED_SYRINGE | INTRAVENOUS | Status: DC | PRN

## 2022-08-15 MED ORDER — OXYCODONE-ACETAMINOPHEN 5-325 MG PO TABS: 2.0000 | ORAL_TABLET | ORAL | Status: DC | PRN

## 2022-08-15 MED ORDER — LACTATED RINGERS IV SOLN: INTRAVENOUS | Status: DC

## 2022-08-15 MED ORDER — LIDOCAINE HCL (PF) 1 % IJ SOLN: 30.0000 mL | INTRAMUSCULAR | Status: DC | PRN

## 2022-08-15 MED ORDER — ONDANSETRON HCL 4 MG/2ML IJ SOLN: 4.0000 mg | Freq: Four times a day (QID) | INTRAMUSCULAR | Status: DC | PRN

## 2022-08-15 MED ORDER — OXYTOCIN-SODIUM CHLORIDE 30-0.9 UT/500ML-% IV SOLN
1.0000 m[IU]/min | INTRAVENOUS | Status: DC
  Administered 2022-08-15: 1 m[IU]/min via INTRAVENOUS

## 2022-08-15 MED ORDER — MISOPROSTOL 25 MCG QUARTER TABLET
25.0000 ug | ORAL_TABLET | ORAL | Status: DC | PRN
  Administered 2022-08-15 (×4): 25 ug via VAGINAL
  Filled 2022-08-15 (×5): qty 1

## 2022-08-15 MED ORDER — OXYTOCIN-SODIUM CHLORIDE 30-0.9 UT/500ML-% IV SOLN
2.5000 [IU]/h | INTRAVENOUS | Status: DC
  Filled 2022-08-15: qty 500

## 2022-08-15 MED ORDER — FENTANYL CITRATE (PF) 100 MCG/2ML IJ SOLN
50.0000 ug | INTRAMUSCULAR | Status: DC | PRN
  Administered 2022-08-15 – 2022-08-16 (×4): 100 ug via INTRAVENOUS
  Filled 2022-08-15 (×4): qty 2

## 2022-08-15 MED ORDER — FLEET ENEMA 7-19 GM/118ML RE ENEM: 1.0000 | ENEMA | RECTAL | Status: DC | PRN

## 2022-08-15 MED ORDER — OXYTOCIN BOLUS FROM INFUSION: 333.0000 mL | Freq: Once | INTRAVENOUS | Status: DC

## 2022-08-15 NOTE — Progress Notes (Signed)
Tracing reviewed remotely by me Cat 1 Ctxs q 2-31mn 3rd cytotec placed by RN at 1612 and exam unchanged Urine PCR 0.19 BPs mild range on procardia xl '30mg'$  daily

## 2022-08-15 NOTE — H&P (Signed)
Beverly Richards is a 31 y.o. female presenting for  IOL for H/O CHTN  controlled with nifidipene.  Pt denies any HA , RUQ pain or blurred vision.  S he reports good FM. OB History     Gravida  1   Para      Term      Preterm      AB      Living         SAB      IAB      Ectopic      Multiple      Live Births             Past Medical History:  Diagnosis Date   Anemia    Anxiety    Crohn's disease ([place])    Hypertension    Monocytosis 10/21/2013   Ovarian cyst    Past Surgical History:  Procedure Laterality Date   COLONOSCOPY N/A 10/23/2013   Procedure: COLONOSCOPY;  Surgeon: Missy Sabins, MD;  Location: [facility];  Service: Endoscopy;  Laterality: N/A;   NO PAST SURGERIES     TONSILLECTOMY     Family History: family history includes Birth defects in her paternal grandmother; Diabetes in her maternal grandmother and mother; Hyperlipidemia in her maternal grandmother and mother; Hypertension in her maternal aunt, maternal grandmother, maternal uncle, and mother. Social History:  reports that she has never smoked. She has never used smokeless tobacco. She reports that she does not drink alcohol and does not use drugs.     Maternal Diabetes: No Genetic Screening: Declined Maternal Ultrasounds/Referrals: Normal Fetal Ultrasounds or other Referrals:  None Maternal Substance Abuse:  No Significant Maternal Medications:  Meds include: Other:  Significant Maternal Lab Results:  Group B Strep negative Number of Prenatal Visits:greater than 3 verified prenatal visits Other Comments:   Pt has Chrons DZ.    Review of Systems History Dilation: Fingertip Effacement (%): 50 Station: -3 Exam by:: Joylene Igo RN Blood pressure 132/87, pulse 81, temperature 98.2 F (36.8 C), temperature source Oral, resp. rate 16, height '5\' 3"'$  (1.6 m), weight 83.5 kg. Exam Physical Exam  Physical Examination: General appearance - alert, well appearing, and in no  distress Mental status - alert, oriented to person, place, and time Chest - clear to auscultation, no wheezes, rales or rhonchi, symmetric air entry Heart - normal rate and regular rhythm Abdomen - soft, nontender, nondistended, no masses or organomegaly gravid Pelvic - normal external genitalia, vulva, vagina, cervix, uterus and adnexa,  Extremities - peripheral pulses normal, no pedal edema, no clubbing or cyanosis, Homan's sign negative bilaterally  Prenatal labs: ABO, Rh: --/--/B POS (11/15 0745) Antibody: NEG (11/15 0745) Rubella: Immune (05/04 0000) RPR: Nonreactive (05/04 0000)  HBsAg: Negative (05/04 0000)  HIV: Non-reactive (05/04 0000)  GBS: Negative/-- (11/15 0000)   Assessment/Plan: 37 weeks one day with CHTN Admit for IOL Cytotect for cerviical ripening GBS neg BP wllll controlled on procarida Anticipate SVD Check PIH labs   Beverly Richards 08/15/2022, 9:07 AM

## 2022-08-15 NOTE — Progress Notes (Signed)
Tracing reviewed on L&D Cat 1 Rare ctx GBS neg S/p placement of cytotec #1 by RN FT/50/-3 Last took remicade in September Last Crohn's flare 42yr ago Cont nifedipine xl '30mg'$ 

## 2022-08-15 NOTE — Progress Notes (Signed)
Tracing reviewed remotely by me Cat 1 Ctxs q 6 - 7 min BPs mild range on procardia xl '30mg'$  daily CMP added to am labs CC Urine PCR ordered Exam unchanged and 2nd cytotec placed by RN at noon

## 2022-08-16 ENCOUNTER — Inpatient Hospital Stay (HOSPITAL_COMMUNITY): Payer: 59 | Admitting: Anesthesiology

## 2022-08-16 DIAGNOSIS — D649 Anemia, unspecified: Secondary | ICD-10-CM

## 2022-08-16 DIAGNOSIS — O164 Unspecified maternal hypertension, complicating childbirth: Secondary | ICD-10-CM

## 2022-08-16 DIAGNOSIS — O9902 Anemia complicating childbirth: Secondary | ICD-10-CM

## 2022-08-16 DIAGNOSIS — Z3A37 37 weeks gestation of pregnancy: Secondary | ICD-10-CM

## 2022-08-16 LAB — CBC
HCT: 29.1 % — ABNORMAL LOW (ref 36.0–46.0)
Hemoglobin: 9.6 g/dL — ABNORMAL LOW (ref 12.0–15.0)
MCH: 29.4 pg (ref 26.0–34.0)
MCHC: 33 g/dL (ref 30.0–36.0)
MCV: 89.3 fL (ref 80.0–100.0)
Platelets: 250 10*3/uL (ref 150–400)
RBC: 3.26 MIL/uL — ABNORMAL LOW (ref 3.87–5.11)
RDW: 14.6 % (ref 11.5–15.5)
WBC: 6.8 10*3/uL (ref 4.0–10.5)
nRBC: 0 % (ref 0.0–0.2)

## 2022-08-16 MED ORDER — LIDOCAINE HCL (PF) 1 % IJ SOLN
INTRAMUSCULAR | Status: DC | PRN
  Administered 2022-08-16: 3 mL via EPIDURAL
  Administered 2022-08-16: 5 mL via EPIDURAL

## 2022-08-16 MED ORDER — OXYTOCIN-SODIUM CHLORIDE 30-0.9 UT/500ML-% IV SOLN
1.0000 m[IU]/min | INTRAVENOUS | Status: DC
  Filled 2022-08-16: qty 500

## 2022-08-16 MED ORDER — OXYTOCIN-SODIUM CHLORIDE 30-0.9 UT/500ML-% IV SOLN: 1.0000 m[IU]/min | INTRAVENOUS | Status: DC

## 2022-08-16 NOTE — Progress Notes (Signed)
Beverly Richards is a 31 y.o. G1P0 at 53w1dadmitted for induction of labor for chronic HTN on procardia  Subjective:  Patient feels strong contractions, desires more IV pain medication.   Objective: BP 137/83   Pulse 83   Temp 98.3 F (36.8 C) (Oral)   Resp 15   Ht '5\' 3"'$  (1.6 m)   Wt 83.5 kg   SpO2 99%   BMI 32.59 kg/m  No intake/output data recorded. No intake/output data recorded.  FHT:  FHR: 150 bpm, variability: moderate,  accelerations:  Present,  decelerations:  Absent UC:   regular, every 3 to 4 minutes SVE:   Dilation: 2 Effacement (%): 30, 40 Station: -2, -1 Exam by:: Beverly Richards MD Foley balloon Placement: Speculum placed in. Betadine applied on cervix with gauze pad.  22 French Foley balloon catheter placed into cervix.  60 cc NS instilled into bulb. Catheter placed under tension with tape to patient's thigh. Patient tolerated the procedure well.   Dr. EWaymon Amato    Labs: Lab Results  Component Value Date   WBC 6.8 08/16/2022   HGB 9.6 (L) 08/16/2022   HCT 29.1 (L) 08/16/2022   MCV 89.3 08/16/2022   PLT 250 08/16/2022    Assessment / Plan: 31y.o. G1P0 at 338w1deing induced due to Chronic HTN on medication, s/p 4 vaginal cytotecs for cervical ripening, and now with balloon foley,   Labor:  Will restart pitocin titration, hold at 6 mu/min until balloon comes out then will increase titration.  Preeclampsia:   None Fetal Wellbeing:  Category I Pain Control:  Epidural and IV pain meds as desired. I/D:   GBS Negative.  Anticipated MOD:  NSVD   EmArchie EndoMD 08/16/2022.

## 2022-08-16 NOTE — Progress Notes (Addendum)
Beverly Richards is a 31 y.o. G1P0 at 57w1dadmitted for induction of labor for chronic HTN on medication  Subjective:  Patient is comfortable with epidural.   Objective: BP (!) 146/83   Pulse 77   Temp 98.4 F (36.9 C)   Resp 16   Ht '5\' 3"'$  (1.6 m)   Wt 83.5 kg   SpO2 99%   BMI 32.59 kg/m  I/O last 3 completed shifts: In: 5673.7 [P.O.:840; I.V.:4760.1; Other:73.6] Out: 1100 [Urine:1100] No intake/output data recorded.  FHT:  FHR: 150 bpm, variability: moderate,  accelerations:  Present,  decelerations:  Absent UC:   regular, every 2 to 3 minutes SVE:   Dilation: 5 Effacement (%): 60 Station: -2 Exam by:: Dr. KAlesia RichardsArtificial rupture of membranes done: copius blood tinged fluid released.   Labs: Lab Results  Component Value Date   WBC 6.8 08/16/2022   HGB 9.6 (L) 08/16/2022   HCT 29.1 (L) 08/16/2022   MCV 89.3 08/16/2022   PLT 250 08/16/2022    Assessment / Plan: 31y.o. G1P0 at 367w1deing induced due to Chronic HTN on medication, s/p 4 vaginal cytotecs for cervical ripening, s/p balloon foley, on pitocin,   Labor:  C/w pitocin titration.  Preeclampsia:   None Fetal Wellbeing:  Category I Pain Control:  Epidural  I/D:   GBS Negative.  Anticipated MOD:  NSVD Care turned over [facility].  EmArchie EndoMD 08/16/2022.

## 2022-08-16 NOTE — Progress Notes (Signed)
Beverly Richards is a 31 y.o. G1P0 at 53w1dadmitted for induction for chronic HTN on Procardia  Subjective:  Patient feels strong contractions, desires IV pain medication.   Objective: BP (!) 147/89   Pulse 90   Temp 97.7 F (36.5 C) (Oral)   Resp 16   Ht '5\' 3"'$  (1.6 m)   Wt 83.5 kg   BMI 32.59 kg/m  No intake/output data recorded. No intake/output data recorded.    08/16/2022   10:00 AM 08/16/2022    9:30 AM 08/16/2022    9:10 AM  Vitals with BMI  Systolic [id number] Diastolic 89 88 91  Pulse 90 75 78    FHT:  FHR: 150 bpm, variability: moderate,  accelerations:  Present,  decelerations:  Absent UC:   regular, every 3 to 4 minutes SVE:   Dilation: 2 Effacement (%): 50 Station: -1 Exam by:: RNicanor Bake RN  Labs: Lab Results  Component Value Date   WBC 7.4 08/15/2022   HGB 9.5 (L) 08/15/2022   HCT 28.1 (L) 08/15/2022   MCV 89.5 08/15/2022   PLT 233 08/15/2022    Assessment / Plan: Induction of labor due to Chronic HTN on medication,  progressing well on pitocin, and s/p 4 vaginal cytotecs for cervical ripening,   Labor:  Will stop pitocin, allow patient to eat and then restart pitocin.  Preeclampsia:   None Fetal Wellbeing:  Category I Pain Control:  Epidural and IV pain meds as desired. I/D:   GBS Negative.  Anticipated MOD:  NSVD  EArchie Endo MD 08/16/2022.

## 2022-08-16 NOTE — Anesthesia Preprocedure Evaluation (Addendum)
Anesthesia Evaluation  Patient identified by MRN, date of birth, ID band Patient awake    Reviewed: Allergy & Precautions, NPO status , Patient's Chart, lab work & pertinent test results  Airway Mallampati: I       Dental no notable dental hx.    Pulmonary    Pulmonary exam normal        Cardiovascular hypertension, Normal cardiovascular exam     Neuro/Psych  PSYCHIATRIC DISORDERS Anxiety        GI/Hepatic   Endo/Other    Renal/GU      Musculoskeletal   Abdominal  (+) + obese  Peds  Hematology  (+) Blood dyscrasia, anemia   Anesthesia Other Findings   Reproductive/Obstetrics (+) Pregnancy                             Anesthesia Physical Anesthesia Plan  ASA: 2  Anesthesia Plan: Epidural   Post-op Pain Management:    Induction:   PONV Risk Score and Plan:   Airway Management Planned:   Additional Equipment:   Intra-op Plan:   Post-operative Plan:   Informed Consent: I have reviewed the patients History and Physical, chart, labs and discussed the procedure including the risks, benefits and alternatives for the proposed anesthesia with the patient or authorized representative who has indicated his/her understanding and acceptance.       Plan Discussed with:   Anesthesia Plan Comments:        Anesthesia Quick Evaluation

## 2022-08-16 NOTE — Anesthesia Procedure Notes (Signed)
Epidural Patient location during procedure: OB Start time: 08/16/2022 12:44 PM End time: 08/16/2022 12:47 PM  Staffing Anesthesiologist: Nilda Simmer, MD Performed: anesthesiologist   Preanesthetic Checklist Completed: patient identified, IV checked, site marked, risks and benefits discussed, surgical consent, monitors and equipment checked, pre-op evaluation and timeout performed  Epidural Patient position: sitting Prep: DuraPrep and site prepped and draped Patient monitoring: continuous pulse ox and blood pressure Approach: midline Location: L3-L4 Injection technique: LOR saline  Needle:  Needle type: Tuohy  Needle gauge: 17 G Needle length: 9 cm and 9 Needle insertion depth: 6 cm Catheter type: closed end flexible Catheter size: 19 Gauge Catheter at skin depth: 10 cm Test dose: negative  Assessment Events: blood not aspirated, injection not painful, no injection resistance, no paresthesia and negative IV test  Additional Notes The patient has requested an epidural for labor pain management. Risks and benefits including, but not limited to, infection, bleeding, local anesthetic toxicity, headache, hypotension, back pain, block failure, etc. were discussed with the patient. The patient expressed understanding and consented to the procedure. I confirmed that the patient has no bleeding disorders and is not taking blood thinners. I confirmed the patient's last platelet count with the nurse. A time-out was performed immediately prior to the procedure. Please see nursing documentation for vital signs. Sterile technique was used throughout the whole procedure. Once LOR achieved, the epidural catheter threaded easily without resistance. Aspiration of the catheter was negative for blood and CSF. The epidural was dosed slowly and an infusion was started.  1 attempt(s)Reason for block:procedure for pain

## 2022-08-16 NOTE — Progress Notes (Signed)
Patient is s/p  cytotec x 4 doses. Pitocin started 1x1 at midnignt. Currently on 2 mU of pitocin. Contracting every 1-2 minutes.   Tracing reviewed remotely.  FHR baseline 130's with moderate variability , accelerations are present . No decelerations Toco: contractions every 1-2 minutes.  Cervical exam at 0326 2/50/-2   Continue pitocin plan AROM with fetal descent.

## 2022-08-16 NOTE — Progress Notes (Signed)
OB Progress Note  S: Patient resting comfortably with epidural. Consents to IUPC placement if indicated.  O: BP 128/69   Pulse 72   Temp 97.7 F (36.5 C) (Axillary)   Resp 16   Ht '5\' 3"'$  (1.6 m)   Wt 83.5 kg   SpO2 99%   BMI 32.59 kg/m   FHT: 140bpm, moderate variablity, + accels, - decels Toco: q1-2 minutes SVE: 6/80/-2, sutures palpated, IUPC placed without difficulty  A/P: 31 y.o. G1P0 @ 32w1dadmitted for induction of labor for chronic HTN.  FWB: Cat. I Labor course: S/p Cytotec vaginally x 4 doses, s/p FB, AROM 1830 on 11/16, at Pitocin currently at 140mmin, IUPC placed now -- titrate to MVUs of at least 200 Pain: Comfortable with epidural GBS: Negative Anticipate SVD  MeDrema DallasDO

## 2022-08-17 ENCOUNTER — Encounter (HOSPITAL_COMMUNITY)
Admission: RE | Disposition: A | Discharge status: Home / Self Care | Payer: Self-pay | Source: Home / Self Care | Attending: Obstetrics and Gynecology

## 2022-08-17 ENCOUNTER — Encounter (HOSPITAL_COMMUNITY): Payer: Self-pay | Admitting: Obstetrics and Gynecology

## 2022-08-17 SURGERY — Surgical Case
Anesthesia: Epidural | Site: Abdomen | Wound class: Clean Contaminated

## 2022-08-17 MED ORDER — KETOROLAC TROMETHAMINE 30 MG/ML IJ SOLN
INTRAMUSCULAR | Status: AC
  Filled 2022-08-17: qty 1

## 2022-08-17 MED ORDER — SOD CITRATE-CITRIC ACID 500-334 MG/5ML PO SOLN: 30.0000 mL | ORAL | Status: DC

## 2022-08-17 MED ORDER — SODIUM CHLORIDE 0.9 % IV SOLN
INTRAVENOUS | Status: AC
  Filled 2022-08-17: qty 5

## 2022-08-17 MED ORDER — PROMETHAZINE HCL 25 MG/ML IJ SOLN: 6.2500 mg | INTRAMUSCULAR | Status: DC | PRN

## 2022-08-17 MED ORDER — CEFAZOLIN SODIUM-DEXTROSE 2-3 GM-%(50ML) IV SOLR
INTRAVENOUS | Status: DC | PRN
  Administered 2022-08-17: 2 g via INTRAVENOUS

## 2022-08-17 MED ORDER — SODIUM CHLORIDE 0.9 % IV SOLN: INTRAVENOUS | Status: DC | PRN

## 2022-08-17 MED ORDER — SODIUM CHLORIDE 0.9 % IV SOLN: 500.0000 mg | INTRAVENOUS | Status: DC

## 2022-08-17 MED ORDER — KETOROLAC TROMETHAMINE 30 MG/ML IJ SOLN
30.0000 mg | Freq: Four times a day (QID) | INTRAMUSCULAR | Status: AC | PRN
  Administered 2022-08-18: 30 mg via INTRAVENOUS
  Filled 2022-08-17: qty 1

## 2022-08-17 MED ORDER — NALOXONE HCL 4 MG/10ML IJ SOLN: 1.0000 ug/kg/h | INTRAVENOUS | Status: DC | PRN

## 2022-08-17 MED ORDER — DIPHENOXYLATE-ATROPINE 2.5-0.025 MG PO TABS
ORAL_TABLET | ORAL | Status: AC
  Filled 2022-08-17: qty 1

## 2022-08-17 MED ORDER — ONDANSETRON HCL 4 MG/2ML IJ SOLN
INTRAMUSCULAR | Status: AC
  Filled 2022-08-17: qty 2

## 2022-08-17 MED ORDER — LACTATED RINGERS IV SOLN: INTRAVENOUS | Status: DC | PRN

## 2022-08-17 MED ORDER — MORPHINE SULFATE (PF) 0.5 MG/ML IJ SOLN
INTRAMUSCULAR | Status: AC
  Filled 2022-08-17: qty 10

## 2022-08-17 MED ORDER — ONDANSETRON HCL 4 MG/2ML IJ SOLN: 4.0000 mg | Freq: Three times a day (TID) | INTRAMUSCULAR | Status: DC | PRN

## 2022-08-17 MED ORDER — SODIUM CHLORIDE 0.9 % IV SOLN
INTRAVENOUS | Status: DC | PRN
  Administered 2022-08-17: 500 mg via INTRAVENOUS

## 2022-08-17 MED ORDER — FENTANYL CITRATE (PF) 100 MCG/2ML IJ SOLN
INTRAMUSCULAR | Status: AC
  Filled 2022-08-17: qty 2

## 2022-08-17 MED ORDER — ACETAMINOPHEN 10 MG/ML IV SOLN
INTRAVENOUS | Status: DC | PRN
  Administered 2022-08-17: 1000 mg via INTRAVENOUS

## 2022-08-17 MED ORDER — CARBOPROST TROMETHAMINE 250 MCG/ML IM SOLN
INTRAMUSCULAR | Status: DC | PRN
  Administered 2022-08-17: 250 ug via INTRAMUSCULAR

## 2022-08-17 MED ORDER — ACETAMINOPHEN 500 MG PO TABS
1000.0000 mg | ORAL_TABLET | Freq: Four times a day (QID) | ORAL | Status: AC
  Administered 2022-08-18 (×4): 1000 mg via ORAL
  Filled 2022-08-17 (×4): qty 2

## 2022-08-17 MED ORDER — CEFAZOLIN SODIUM-DEXTROSE 2-4 GM/100ML-% IV SOLN: 2.0000 g | INTRAVENOUS | Status: DC

## 2022-08-17 MED ORDER — HYDROMORPHONE HCL 1 MG/ML IJ SOLN: 0.2500 mg | INTRAMUSCULAR | Status: DC | PRN

## 2022-08-17 MED ORDER — KETOROLAC TROMETHAMINE 30 MG/ML IJ SOLN: 30.0000 mg | Freq: Four times a day (QID) | INTRAMUSCULAR | Status: AC | PRN

## 2022-08-17 MED ORDER — PHENYLEPHRINE HCL (PRESSORS) 10 MG/ML IV SOLN
INTRAVENOUS | Status: DC | PRN
  Administered 2022-08-17 (×2): 160 ug via INTRAVENOUS

## 2022-08-17 MED ORDER — MEPERIDINE HCL 25 MG/ML IJ SOLN: 6.2500 mg | INTRAMUSCULAR | Status: DC | PRN

## 2022-08-17 MED ORDER — MORPHINE SULFATE (PF) 0.5 MG/ML IJ SOLN
INTRAMUSCULAR | Status: DC | PRN
  Administered 2022-08-17: 3 mg via EPIDURAL

## 2022-08-17 MED ORDER — DIPHENOXYLATE-ATROPINE 2.5-0.025 MG PO TABS
2.0000 | ORAL_TABLET | Freq: Once | ORAL | Status: AC
  Administered 2022-08-17: 2 via ORAL

## 2022-08-17 MED ORDER — FENTANYL CITRATE (PF) 100 MCG/2ML IJ SOLN
INTRAMUSCULAR | Status: DC | PRN
  Administered 2022-08-17: 100 ug via EPIDURAL

## 2022-08-17 MED ORDER — ONDANSETRON HCL 4 MG/2ML IJ SOLN
INTRAMUSCULAR | Status: DC | PRN
  Administered 2022-08-17: 4 mg via INTRAVENOUS

## 2022-08-17 MED ORDER — OXYTOCIN-SODIUM CHLORIDE 30-0.9 UT/500ML-% IV SOLN
INTRAVENOUS | Status: DC | PRN
  Administered 2022-08-17 (×2): 30 [IU] via INTRAVENOUS

## 2022-08-17 MED ORDER — SCOPOLAMINE 1 MG/3DAYS TD PT72
MEDICATED_PATCH | TRANSDERMAL | Status: DC | PRN
  Administered 2022-08-17: 1 via TRANSDERMAL

## 2022-08-17 MED ORDER — STERILE WATER FOR IRRIGATION IR SOLN
Status: DC | PRN
  Administered 2022-08-17: 1

## 2022-08-17 MED ORDER — OXYCODONE HCL 5 MG PO TABS
5.0000 mg | ORAL_TABLET | Freq: Four times a day (QID) | ORAL | Status: DC | PRN
  Administered 2022-08-18 – 2022-08-20 (×4): 5 mg via ORAL
  Filled 2022-08-17 (×4): qty 1

## 2022-08-17 MED ORDER — LIDOCAINE-EPINEPHRINE (PF) 2 %-1:200000 IJ SOLN
INTRAMUSCULAR | Status: DC | PRN
  Administered 2022-08-17 (×2): 5 mL via INTRADERMAL

## 2022-08-17 MED ORDER — OXYTOCIN-SODIUM CHLORIDE 30-0.9 UT/500ML-% IV SOLN
INTRAVENOUS | Status: AC
  Filled 2022-08-17: qty 500

## 2022-08-17 MED ORDER — MISOPROSTOL 200 MCG PO TABS
1000.0000 ug | ORAL_TABLET | Freq: Once | ORAL | Status: AC
  Administered 2022-08-17: 1000 ug via RECTAL

## 2022-08-17 MED ORDER — SCOPOLAMINE 1 MG/3DAYS TD PT72: 1.0000 | MEDICATED_PATCH | Freq: Once | TRANSDERMAL | Status: DC

## 2022-08-17 MED ORDER — TRANEXAMIC ACID-NACL 1000-0.7 MG/100ML-% IV SOLN: 1000.0000 mg | INTRAVENOUS | Status: AC

## 2022-08-17 MED ORDER — SODIUM CHLORIDE 0.9% FLUSH: 3.0000 mL | INTRAVENOUS | Status: DC | PRN

## 2022-08-17 MED ORDER — CARBOPROST TROMETHAMINE 250 MCG/ML IM SOLN
INTRAMUSCULAR | Status: AC
  Filled 2022-08-17: qty 1

## 2022-08-17 MED ORDER — SCOPOLAMINE 1 MG/3DAYS TD PT72
MEDICATED_PATCH | TRANSDERMAL | Status: AC
  Filled 2022-08-17: qty 1

## 2022-08-17 MED ORDER — KETOROLAC TROMETHAMINE 30 MG/ML IJ SOLN
30.0000 mg | Freq: Once | INTRAMUSCULAR | Status: AC | PRN
  Administered 2022-08-17: 30 mg via INTRAVENOUS

## 2022-08-17 MED ORDER — TRANEXAMIC ACID-NACL 1000-0.7 MG/100ML-% IV SOLN
INTRAVENOUS | Status: AC
  Administered 2022-08-17: 1000 mg via INTRAVENOUS
  Filled 2022-08-17: qty 100

## 2022-08-17 MED ORDER — NALOXONE HCL 0.4 MG/ML IJ SOLN: 0.4000 mg | INTRAMUSCULAR | Status: DC | PRN

## 2022-08-17 SURGICAL SUPPLY — 39 items
BARRIER ADHS 3X4 INTERCEED (GAUZE/BANDAGES/DRESSINGS) IMPLANT
BENZOIN TINCTURE PRP APPL 2/3 (GAUZE/BANDAGES/DRESSINGS) ×1 IMPLANT
BINDER ABDOMINAL 10 UNV 27-48 (MISCELLANEOUS) IMPLANT
BINDER ABDOMINAL 12 ML 46-62 (SOFTGOODS) IMPLANT
CELLS DAT CNTRL 66122 CELL SVR (MISCELLANEOUS) ×1 IMPLANT
CHLORAPREP W/TINT 26ML (MISCELLANEOUS) ×2 IMPLANT
CLAMP UMBILICAL CORD (MISCELLANEOUS) ×1 IMPLANT
CLOTH BEACON ORANGE TIMEOUT ST (SAFETY) ×1 IMPLANT
DRSG OPSITE POSTOP 4X10 (GAUZE/BANDAGES/DRESSINGS) ×1 IMPLANT
ELECT REM PT RETURN 9FT ADLT (ELECTROSURGICAL) ×1
ELECTRODE REM PT RTRN 9FT ADLT (ELECTROSURGICAL) ×1 IMPLANT
EXTRACTOR VACUUM KIWI (MISCELLANEOUS) IMPLANT
GAUZE SPONGE 4X4 12PLY STRL LF (GAUZE/BANDAGES/DRESSINGS) IMPLANT
GLOVE BIOGEL M 7.0 STRL (GLOVE) ×2 IMPLANT
GLOVE BIOGEL PI IND STRL 7.0 (GLOVE) ×3 IMPLANT
GOWN STRL REUS W/TWL LRG LVL3 (GOWN DISPOSABLE) ×3 IMPLANT
KIT ABG SYR 3ML LUER SLIP (SYRINGE) IMPLANT
NDL HYPO 25X5/8 SAFETYGLIDE (NEEDLE) IMPLANT
NEEDLE HYPO 25X5/8 SAFETYGLIDE (NEEDLE) IMPLANT
NS IRRIG 1000ML POUR BTL (IV SOLUTION) ×1 IMPLANT
PACK C SECTION WH (CUSTOM PROCEDURE TRAY) ×1 IMPLANT
PAD ABD 7.5X8 STRL (GAUZE/BANDAGES/DRESSINGS) IMPLANT
PAD OB MATERNITY 4.3X12.25 (PERSONAL CARE ITEMS) ×1 IMPLANT
RETRACTOR WND ALEXIS 18 MED (MISCELLANEOUS) IMPLANT
RTRCTR C-SECT PINK 25CM LRG (MISCELLANEOUS) IMPLANT
RTRCTR WOUND ALEXIS 18CM MED (MISCELLANEOUS) ×1
STRIP CLOSURE SKIN 1/2X4 (GAUZE/BANDAGES/DRESSINGS) ×1 IMPLANT
SUT MNCRL 0 VIOLET CTX 36 (SUTURE) ×2 IMPLANT
SUT MONOCRYL 0 CTX 36 (SUTURE) ×3
SUT PDS AB 0 CT1 27 (SUTURE) ×2 IMPLANT
SUT PLAIN 0 NONE (SUTURE) IMPLANT
SUT VIC AB 2-0 CT1 27 (SUTURE) ×1
SUT VIC AB 2-0 CT1 TAPERPNT 27 (SUTURE) ×1 IMPLANT
SUT VIC AB 3-0 SH 27 (SUTURE)
SUT VIC AB 3-0 SH 27X BRD (SUTURE) IMPLANT
SUT VIC AB 4-0 KS 27 (SUTURE) ×1 IMPLANT
TOWEL OR 17X24 6PK STRL BLUE (TOWEL DISPOSABLE) ×1 IMPLANT
TRAY FOLEY W/BAG SLVR 14FR LF (SET/KITS/TRAYS/PACK) ×1 IMPLANT
WATER STERILE IRR 1000ML POUR (IV SOLUTION) ×1 IMPLANT

## 2022-08-17 NOTE — Transfer of Care (Signed)
Immediate Anesthesia Transfer of Care Note  Patient: Beverly Richards  Procedure(s) Performed: CESAREAN SECTION (Abdomen)  Patient Location: PACU  Anesthesia Type:Epidural  Level of Consciousness: awake, alert , and oriented  Airway & Oxygen Therapy: Patient Spontanous Breathing  Post-op Assessment: Report given to RN and Post -op Vital signs reviewed and stable  Post vital signs: Reviewed and stable  Last Vitals:  Vitals Value Taken Time  BP 115/85 08/17/22 2216  Temp    Pulse 80 08/17/22 2227  Resp 17 08/17/22 2227  SpO2 96 % 08/17/22 2227  Vitals shown include unvalidated device data.  Last Pain:  Vitals:   08/17/22 1701  TempSrc: Oral  PainSc:          Complications: No notable events documented.

## 2022-08-17 NOTE — Op Note (Signed)
Cesarean Section Procedure Note  Indications: failure to progress: arrest of descent  Pre-operative Diagnosis: 37 week 2 day pregnancy.  Post-operative Diagnosis: same  Surgeon: Christophe Louis M.D.  Assistants: Burman Foster M.D.  Anesthesia: Epidural anesthesia  ASA Class: 2   Procedure Details   The patient was seen in the Holding Room. The risks, benefits, complications, treatment options, and expected outcomes were discussed with the patient.  The patient concurred with the proposed plan, giving informed consent.  The site of surgery properly noted/marked. The patient was taken to Operating Room # B, identified as Beverly Richards and the procedure verified as C-Section Delivery. A Time Out was held and the above information confirmed.  After induction of anesthesia, the patient was draped and prepped in the usual sterile manner. A Pfannenstiel incision was made and carried down through the subcutaneous tissue to the fascia. Fascial incision was made and extended transversely. The fascia was separated from the underlying rectus tissue superiorly and inferiorly. The peritoneum was identified and entered. Peritoneal incision was extended longitudinally. The utero-vesical peritoneal reflection was incised transversely and the bladder flap was bluntly freed from the lower uterine segment. A low transverse uterine incision was made. Delivered from cephalic presentation was a 3050 gram Female with Apgar scores of 8 at one minute and 9 at five minutes. After the umbilical cord was clamped and cut cord blood was obtained for evaluation. The placenta was removed intact and appeared normal. The uterine outline, tubes and ovaries appeared normal. The uterine incision was closed with running locked sutures of  0 Monocryl .  A second layer of suture was used to imbricate the incision. Hemostasis was observed. Lavage was carried out until clear. The peritoneum was closed with 2-0 vicryl. The fascia was then  reapproximated with running sutures of  0 pds . The skin was reapproximated with  4-0 vicryl  .  Instrument, sponge, and needle counts were correct prior the abdominal closure and at the conclusion of the case.   Findings: Female infant in the cephalic presentation Occiput posterior. Normal appearing fallopian tubes and ovaries   Estimated Blood Loss:   214 mL         Drains: None         Total IV Fluids:  Peranesthesia ml         Specimens: Placenta sent to labor and delivery           Implants: None         Complications:  None; patient tolerated the procedure well.         Disposition: PACU - hemodynamically stable.         Condition: stable  Attending Attestation: I performed the procedure.

## 2022-08-17 NOTE — Progress Notes (Signed)
FHT Review:  145bpm, moderate variability, + accel, - decel. Contractions every 2 minutes. MVUs currently 145. Cervix 7.5/80/-2 at 0145. Pitocin at 17m/min. Continue titration of pitocin to achieve MVUs of at least 200 or greater. FHT Category I.  BP at 0130 was 160/96 which repeated 158/97. Last two blood pressures 121-138/74-76. Continue Procardia XL '30mg'$  daily. Last temp at 0145 100 deg Farenheit -- will continue to monitor for signs/symptoms of chorio.  MDrema Dallas DO

## 2022-08-17 NOTE — Progress Notes (Signed)
In to evaluate patient due to report of pushing for the last 4 hours and maternal exhaustion.  Cervix complete/ +2 station with caput.  I discussed with the patient option of vacuum extraction vs  primary cesarean section.,  We discussed r/o vacuum including  but not limited to failure to delivery fetal head resulting in need for cesarean section, r/o shoulder dystocia, hematoma and scalp laceration.  We also discussed r/o cesarean section. Infection bleeding damage to bowel bladder baby with the need for further surgery. R/O transfusion discussed. Pt voiced understanding and desires to proceed with Primary cesarean section.

## 2022-08-17 NOTE — Progress Notes (Signed)
Subjective:    Pushing with strong effort. Using the mirror for motivation.   Objective:    VS: BP (!) 146/86   Pulse 83   Temp 99.4 F (37.4 C) (Oral)   Resp 18   Ht '5\' 3"'$  (1.6 m)   Wt 83.5 kg   SpO2 99%   BMI 32.59 kg/m  FHR : baseline 145 / variability moderate / accelerations [resent / absent decelerations IUPC: contractions every 3 minutes / MVU inadequate  Dilation: 10 Dilation Complete Date: 08/17/22 Dilation Complete Time: 1505 Effacement (%): 100 Cervical Position: Posterior Station: Plus 1 Presentation: Vertex Exam by:: Adonis Huguenin CNM Pitocin 40 mU/min  Assessment/Plan:   31 y.o. G1P0 66w2dIOL for CHTN Protracted second stage Cat 1  Pt has an adequate pelvis, vertex feels OP, minimal rotation noted during pushing. CNM to update MD during handoff.   [place] CNM 08/17/2022 8:02 PM

## 2022-08-17 NOTE — Progress Notes (Signed)
Subjective:    Comfortable with epidural. Agrees to hands and knees position to facilitate decent and rotation.   Objective:    VS: BP 133/71   Pulse 88   Temp 98.8 F (37.1 C) (Axillary)   Resp 16   Ht '5\' 3"'$  (1.6 m)   Wt 83.5 kg   SpO2 99%   BMI 32.59 kg/m  FHR : baseline 140 / variability moderate / accelerations present / absent decelerations IUPC: contractions every 3 minutes / MVU 140 Membranes: AROM x 16 hrs, remains clear Dilation: Lip/rim Effacement (%): 80 Cervical Position: Posterior Station: 0 Presentation: Vertex Exam by:: Rosana Hoes, RN Pitocin 36 mU/min  Assessment/Plan:   31 y.o. G1P0 38w2dIOL for CHTN  Labor: Induction - Vaginal Cytotec x4, cervical balloon, AROM, Pitocin. Protracted second stage with MVU >200  Preeclampsia:  no signs or symptoms of toxicity, intake and ouput balanced, labs stable, and continuiung Procardia XL 30 mg PO daily Fetal Wellbeing:  Category I Pain Control:  Epidural I/D:   GBS neg Anticipated MOD:  NSVD  VArrie EasternDNP, CNM 08/17/2022 10:07 AM

## 2022-08-17 NOTE — Progress Notes (Signed)
Change in fetal station from 0 to +2 after 3 hours of pushing. Labia have become edematous. Dr. Landry Mellow updated on duration of pushing, maternal effort, and station. MD to evaluate mode of delivery at bedside.

## 2022-08-17 NOTE — Progress Notes (Signed)
Subjective:    Feeling pressure with contractions. Discussed pushing and preparing for birth. Questions answered and pt agrees. Spouse and mother present and supportive.   Objective:    VS: BP (!) 146/86   Pulse 83   Temp 99.4 F (37.4 C) (Oral)   Resp 18   Ht '5\' 3"'$  (1.6 m)   Wt 83.5 kg   SpO2 99%   BMI 32.59 kg/m  FHR : baseline 145 / variability moderate / accelerations present / absent decelerations Toco: contractions every 3-4 minutes / MVU 110 Membranes: AROM x 19 hrs, remains clear, pt afebrile Dilation: 10 Dilation Complete Date: 08/17/22 Dilation Complete Time: 1505 Effacement (%): 100 Cervical Position: Posterior Station: Plus 1 Presentation: Vertex Exam by:: Ernesta Trabert CNM Pitocin 40 mU/min  Assessment/Plan:   31 y.o. G1P0 [redacted]w[redacted]d Labor:  now complete and pushing Preeclampsia:   moderate range BP, asymptomatic Fetal Wellbeing:  Category I Pain Control:  Epidural I/D:   GBS neg Anticipated MOD:   pushing and hopeful SVD  VArrie EasternDNP, CNM 08/17/2022 7:59 PM

## 2022-08-18 ENCOUNTER — Inpatient Hospital Stay (HOSPITAL_COMMUNITY): Payer: 59 | Admitting: Anesthesiology

## 2022-08-18 ENCOUNTER — Encounter (HOSPITAL_COMMUNITY): Payer: Self-pay | Admitting: Obstetrics and Gynecology

## 2022-08-18 ENCOUNTER — Other Ambulatory Visit: Payer: Self-pay

## 2022-08-18 DIAGNOSIS — Z3490 Encounter for supervision of normal pregnancy, unspecified, unspecified trimester: Secondary | ICD-10-CM | POA: Diagnosis present

## 2022-08-18 DIAGNOSIS — D509 Iron deficiency anemia, unspecified: Secondary | ICD-10-CM | POA: Diagnosis present

## 2022-08-18 DIAGNOSIS — Z98891 History of uterine scar from previous surgery: Secondary | ICD-10-CM

## 2022-08-18 DIAGNOSIS — O10919 Unspecified pre-existing hypertension complicating pregnancy, unspecified trimester: Secondary | ICD-10-CM | POA: Diagnosis present

## 2022-08-18 LAB — CBC WITH DIFFERENTIAL/PLATELET
Abs Immature Granulocytes: 0.07 10*3/uL (ref 0.00–0.07)
Basophils Absolute: 0 10*3/uL (ref 0.0–0.1)
Basophils Relative: 0 %
Eosinophils Absolute: 0 10*3/uL (ref 0.0–0.5)
Eosinophils Relative: 0 %
HCT: 30.5 % — ABNORMAL LOW (ref 36.0–46.0)
Hemoglobin: 10.4 g/dL — ABNORMAL LOW (ref 12.0–15.0)
Immature Granulocytes: 0 %
Lymphocytes Relative: 4 %
Lymphs Abs: 0.7 10*3/uL (ref 0.7–4.0)
MCH: 30 pg (ref 26.0–34.0)
MCHC: 34.1 g/dL (ref 30.0–36.0)
MCV: 87.9 fL (ref 80.0–100.0)
Monocytes Absolute: 0.7 10*3/uL (ref 0.1–1.0)
Monocytes Relative: 5 %
Neutro Abs: 14.1 10*3/uL — ABNORMAL HIGH (ref 1.7–7.7)
Neutrophils Relative %: 91 %
Platelets: 238 10*3/uL (ref 150–400)
RBC: 3.47 MIL/uL — ABNORMAL LOW (ref 3.87–5.11)
RDW: 14.6 % (ref 11.5–15.5)
WBC: 15.6 10*3/uL — ABNORMAL HIGH (ref 4.0–10.5)
nRBC: 0 % (ref 0.0–0.2)

## 2022-08-18 LAB — CBC
HCT: 29 % — ABNORMAL LOW (ref 36.0–46.0)
Hemoglobin: 9.5 g/dL — ABNORMAL LOW (ref 12.0–15.0)
MCH: 29.2 pg (ref 26.0–34.0)
MCHC: 32.8 g/dL (ref 30.0–36.0)
MCV: 89.2 fL (ref 80.0–100.0)
Platelets: 227 10*3/uL (ref 150–400)
RBC: 3.25 MIL/uL — ABNORMAL LOW (ref 3.87–5.11)
RDW: 14.6 % (ref 11.5–15.5)
WBC: 16.8 10*3/uL — ABNORMAL HIGH (ref 4.0–10.5)
nRBC: 0 % (ref 0.0–0.2)

## 2022-08-18 MED ORDER — DIBUCAINE (PERIANAL) 1 % EX OINT: 1.0000 | TOPICAL_OINTMENT | CUTANEOUS | Status: DC | PRN

## 2022-08-18 MED ORDER — MISOPROSTOL 200 MCG PO TABS
200.0000 ug | ORAL_TABLET | Freq: Four times a day (QID) | ORAL | Status: AC
  Administered 2022-08-18 (×3): 200 ug via ORAL
  Filled 2022-08-18 (×4): qty 1

## 2022-08-18 MED ORDER — ACETAMINOPHEN 500 MG PO TABS: 1000.0000 mg | ORAL_TABLET | Freq: Three times a day (TID) | ORAL | Status: DC

## 2022-08-18 MED ORDER — OXYTOCIN-SODIUM CHLORIDE 30-0.9 UT/500ML-% IV SOLN
2.5000 [IU]/h | INTRAVENOUS | Status: AC
  Administered 2022-08-18: 2.5 [IU]/h via INTRAVENOUS

## 2022-08-18 MED ORDER — HYDROMORPHONE HCL 1 MG/ML IJ SOLN: 0.2000 mg | INTRAMUSCULAR | Status: DC | PRN

## 2022-08-18 MED ORDER — POLYSACCHARIDE IRON COMPLEX 150 MG PO CAPS
150.0000 mg | ORAL_CAPSULE | Freq: Every day | ORAL | Status: DC
  Administered 2022-08-18 – 2022-08-20 (×3): 150 mg via ORAL
  Filled 2022-08-18 (×3): qty 1

## 2022-08-18 MED ORDER — NIFEDIPINE ER OSMOTIC RELEASE 30 MG PO TB24
30.0000 mg | ORAL_TABLET | Freq: Every day | ORAL | Status: DC
  Administered 2022-08-18 – 2022-08-20 (×3): 30 mg via ORAL
  Filled 2022-08-18 (×3): qty 1

## 2022-08-18 MED ORDER — SIMETHICONE 80 MG PO CHEW: 80.0000 mg | CHEWABLE_TABLET | ORAL | Status: DC | PRN

## 2022-08-18 MED ORDER — SENNOSIDES-DOCUSATE SODIUM 8.6-50 MG PO TABS
2.0000 | ORAL_TABLET | Freq: Every day | ORAL | Status: DC
  Administered 2022-08-18 – 2022-08-20 (×3): 2 via ORAL
  Filled 2022-08-18 (×3): qty 2

## 2022-08-18 MED ORDER — LACTATED RINGERS IV SOLN: INTRAVENOUS | Status: DC

## 2022-08-18 MED ORDER — SIMETHICONE 80 MG PO CHEW
80.0000 mg | CHEWABLE_TABLET | Freq: Three times a day (TID) | ORAL | Status: DC
  Administered 2022-08-18 – 2022-08-20 (×6): 80 mg via ORAL
  Filled 2022-08-18 (×7): qty 1

## 2022-08-18 MED ORDER — PRENATAL MULTIVITAMIN CH
1.0000 | ORAL_TABLET | Freq: Every day | ORAL | Status: DC
  Administered 2022-08-18 – 2022-08-20 (×3): 1 via ORAL
  Filled 2022-08-18 (×3): qty 1

## 2022-08-18 MED ORDER — COCONUT OIL OIL: 1.0000 | TOPICAL_OIL | Status: DC | PRN

## 2022-08-18 MED ORDER — WITCH HAZEL-GLYCERIN EX PADS: 1.0000 | MEDICATED_PAD | CUTANEOUS | Status: DC | PRN

## 2022-08-18 MED ORDER — OXYCODONE-ACETAMINOPHEN 5-325 MG PO TABS
1.0000 | ORAL_TABLET | ORAL | Status: DC | PRN
  Administered 2022-08-19 (×2): 2 via ORAL
  Filled 2022-08-18 (×2): qty 2

## 2022-08-18 MED ORDER — MENTHOL 3 MG MT LOZG: 1.0000 | LOZENGE | OROMUCOSAL | Status: DC | PRN

## 2022-08-18 NOTE — Lactation Note (Signed)
This note was copied from a baby's chart. Lactation Consultation Note  Patient Name: Beverly Richards Date: 08/18/2022 Reason for consult: Initial assessment;Early term 37-38.6wks Age:30 hours Birth Parent latched infant on her right breast using the football hold position, infant was on and off the breast for 18 minutes. Birth Parent will continue to work on latching infant at the breast, BF according to hunger cues, on demand, 8 to 12+ times within 24 hours, STS. Birth Parent knows to call [facility] [address] [address] for further latch assistance if needed. Mom made aware of O/P services, breastfeeding support groups, community resources, and our phone # for post-discharge questions.   Maternal Data Has patient been taught Hand Expression?: Yes Does the patient have breastfeeding experience prior to this delivery?: No  Feeding Mother's Current Feeding Choice: Breast Milk and Formula  LATCH Score Latch: Repeated attempts needed to sustain latch, nipple held in mouth throughout feeding, stimulation needed to elicit sucking reflex.  Audible Swallowing: A few with stimulation  Type of Nipple: Everted at rest and after stimulation  Comfort (Breast/Nipple): Soft / non-tender  Hold (Positioning): Assistance needed to correctly position infant at breast and maintain latch.  LATCH Score: 7   Lactation Tools Discussed/Used  Birth Parent has [employer] DEBP at home.   Interventions Interventions: Breast feeding basics reviewed;Adjust position;Assisted with latch;Support pillows;Education;Position options;Skin to skin;Breast compression;LC Services brochure  Discharge    Consult Status Consult Status: Follow-up Date: 08/18/22 Follow-up type: In-patient    Eulis Canner 08/18/2022, 1:30 AM

## 2022-08-18 NOTE — Progress Notes (Signed)
MOB was referred for history of anxiety. * Referral screened out by Clinical Social Worker because none of the following criteria appear to apply: ~ History of anxiety/depression during this pregnancy, or of post-partum depression following prior delivery. Per MOB's OBGYN records, no mental health concerns noted during pregnancy. ~ Diagnosis of anxiety and/or depression within last 3 years OR * MOB's symptoms currently being treated with medication and/or therapy. Please contact the Clinical Social Worker if needs arise, by [facility] request, or if MOB scores greater than 9/yes to question 10 on [place] Postpartum Depression Screen.  Signed,  Berniece Salines, MSW, LCSWA, LCASA 08/18/2022 9:28 AM

## 2022-08-18 NOTE — Lactation Note (Signed)
This note was copied from a baby's chart. Lactation Consultation Note  Patient Name: Beverly Richards Date: 08/18/2022 Reason for consult: Follow-up assessment;1st time breastfeeding;Primapara;Early term 37-38.6wks Age:31 hours   P1: Early term infant at 37+2 weeks Feeding preference: Breast/formula  Mother requested latch assistance.  Mother interested in latching "[name]" and requested assistance.  Taught hand expression and finger fed colostrum drops.  Assisted to latch, however, "[name]" was not interested in initiating a suck.  Demonstrated gentle stimulation and breast compressions.  Placed him STS on mother's chest and he fell asleep.  Encouraged feeding at least every three hours due to gestational age and sooner if "[name]" shows cues.  Suggested she call her RN/LC for latch assistance as needed.  He has breast fed twice since delivery so will not supplement at this time.  Mother is aware of the possible need if "[name]" does not continue to latch/feed.  Support person present.   Maternal Data Has patient been taught Hand Expression?: Yes Does the patient have breastfeeding experience prior to this delivery?: No  Feeding Mother's Current Feeding Choice: Breast Milk and Formula  LATCH Score Latch: Too sleepy or reluctant, no latch achieved, no sucking elicited.  Audible Swallowing: None  Type of Nipple: Everted at rest and after stimulation  Comfort (Breast/Nipple): Soft / non-tender  Hold (Positioning): Assistance needed to correctly position infant at breast and maintain latch.  LATCH Score: 5   Lactation Tools Discussed/Used    Interventions Interventions: Breast feeding basics reviewed;Assisted with latch;Skin to skin;Breast massage;Hand express;Breast compression;Position options;Support pillows;Adjust position;Education;LC Services brochure  Discharge Pump: Personal  Consult Status Consult Status: Follow-up Date: 08/19/22 Follow-up type:  In-patient    Francis Yardley R Mikya Don 08/18/2022, 8:40 AM

## 2022-08-18 NOTE — Anesthesia Postprocedure Evaluation (Signed)
Anesthesia Post Note  Patient: Beverly Richards  Procedure(s) Performed: AN AD HOC LABOR EPIDURAL     Patient location during evaluation: Mother Baby Anesthesia Type: Epidural Level of consciousness: awake and alert Pain management: pain level controlled Vital Signs Assessment: post-procedure vital signs reviewed and stable Respiratory status: spontaneous breathing, nonlabored ventilation and respiratory function stable Cardiovascular status: stable Postop Assessment: no headache, no backache and epidural receding Anesthetic complications: no   No notable events documented.  Last Vitals:  Vitals:   08/18/22 1631 08/18/22 2236  BP: 126/86 113/64  Pulse: 76 97  Resp: 16 16  Temp: 36.7 C 37.1 C  SpO2: 100% 99%    Last Pain:  Vitals:   08/18/22 2236  TempSrc: Oral  PainSc:    Pain Goal:                   Ailene Ards

## 2022-08-18 NOTE — Progress Notes (Signed)
Subjective: POD# 1 Information for the patient's newborn:  Beverly Richards, Beverly Richards [[id number]]  female   77 Name [name] Circumcision Yes  Reports feeling tired Feeding: breast and bottle Reports tolerating PO and denies N/V, foley removed, ambulating and urinating w/o difficulty  Pain controlled with  PO meds Denies HA/SOB/dizziness  Flatus not passing Vaginal bleeding is normal, no clots     Objective:  VS:  Vitals:   08/18/22 0240 08/18/22 0347 08/18/22 0542 08/18/22 0549  BP: (!) 143/78 128/89 134/77   Pulse: 76  78   Resp: '16 18  16  '$ Temp: 98.5 F (36.9 C) 97.8 F (36.6 C)  98.1 F (36.7 C)  TempSrc: Axillary Axillary    SpO2: 98% 99% 98% 100%  Weight:      Height:        Intake/Output Summary (Last 24 hours) at 08/18/2022 0635 Last data filed at 08/18/2022 0559 Gross per 24 hour  Intake 2696.15 ml  Output 9324 ml  Net -6627.85 ml     Recent Labs    08/17/22 2353 08/18/22 0455  WBC 15.6* 16.8*  HGB 10.4* 9.5*  HCT 30.5* 29.0*  PLT 238 227    Blood type: --/--/B POS (11/15 0745) Rubella: Immune (05/04 0000)    Physical Exam:  General: alert, cooperative, and no distress CV: Regular rate and rhythm or without murmur or extra heart sounds Resp: clear Abdomen: soft, nondistened, hypoactive BS x4 Incision: clean, dry, and intact Perineum:  Uterine Fundus: firm, below umbilicus, nontender Lochia: minimal Ext:  +1 non-pitting edema, neg for pain, tenderness, and cords.   Assessment/Plan: 31 y.o.   POD# 1. G1P1001                  Principal Problem:   Pregnancy   Routine post-op PP care          Advance diet as tolerated Advised warm fluids and ambulation to improve GI motility Breastfeeding support Anticipate D/C on POD 2 or [place], DNP, CNM 08/18/2022, 6:35 AM

## 2022-08-19 MED ORDER — ACETAMINOPHEN 500 MG PO TABS
1000.0000 mg | ORAL_TABLET | Freq: Four times a day (QID) | ORAL | Status: DC
  Administered 2022-08-19 – 2022-08-20 (×3): 1000 mg via ORAL
  Filled 2022-08-19 (×3): qty 2

## 2022-08-19 MED ORDER — SERTRALINE HCL 25 MG PO TABS
25.0000 mg | ORAL_TABLET | Freq: Every day | ORAL | Status: DC
  Administered 2022-08-19 – 2022-08-20 (×2): 25 mg via ORAL
  Filled 2022-08-19 (×2): qty 1

## 2022-08-19 NOTE — Lactation Note (Signed)
This note was copied from a baby's chart. Lactation Consultation Note  Patient Name: Beverly Richards GGYIR'S Date: 08/19/2022 Reason for consult: Follow-up assessment;Early term 37-38.6wks;1st time breastfeeding;Primapara Age:31 years   P1: Early term infant at 37+2 weeks Feeding preference: Breast/donor breast milk Weight loss: 5%  Spoke with RN prior to visiting with family.  Reviewed current feeding plan with family.  Mother has been attempting breast feeding and has supplemented with donor breast milk.  It was her goal to be exclusively breast feeding, however, she understands the importance of supplementation.  Reviewed the supplementation guidelines and encouraged her to breast feed no longer than 5-10 minutes if baby is not interested.  Supplement with 15-30 mls of donor breast milk.  RN has ordered a personal bottle to last throughout the night.  Suggested mother call her RN/LC for latch assistance as needed or for any difficulties with bottle feeding.  SLP consult entered; assuming follow up will be tomorrow (Monday).  Encouraged mother to continue pumping every three hours.  She has not been consistent with this due to being tired.  Acknowledged her sleepiness and emphasized the importance and to do as close to 3 hours as possible.  Mother verbalized understanding.  Visitors present.   Maternal Data    Feeding Mother's Current Feeding Choice: Breast Milk and Donor Milk Nipple Type: Extra Slow Flow  LATCH Score                    Lactation Tools Discussed/Used    Interventions Interventions: Education;Breast feeding basics reviewed  Discharge Pump: Personal  Consult Status Consult Status: Follow-up Date: 08/20/22 Follow-up type: In-patient    Kolter Reaver R Dinia Joynt 08/19/2022, 3:03 PM

## 2022-08-19 NOTE — Lactation Note (Addendum)
This note was copied from a baby's chart. Lactation Consultation Note  Patient Name: Beverly Richards Date: 08/19/2022 Reason for consult: Difficult latch;Mother's request;Early term 37-38.6wks Age:30 hours Per Birth Parent infant been fussy and not sustaining latch. Birth Parent made few attempts prior to applying 16 mm NS, but infant would not sustain latch was on and off breast, Birth Parent latched infant on her left breast using 16 mm NS and infant after few attempts started sustaining his latch, LC worked with Data processing manager on postioning and working on latching infant at the breast herself with guidance, infant BF for 10 minutes on the left breast and was switched to the right breast using the cross cradle hold with 16 mm NS  and was still breastfeeding after 15 minutes when Hamel left the room.  [place] set Birth Parent up with DEBP using 21 breast flange LC explained how to use and RN Judson Roch) will review again with Birth Parent once infant has finished breastfeeding, Birth Parent understands to pump every 3 hours for 15 minutes on initial setting and give infant back any EBM that is expressed by finger feeding or spoon.  Birth Parent doesn't want to supplement infant with any donor breast milk nor formula at this time, focus on breastfeeding and wants to give infant her EBM, will reassess if she would  need to supplement infant in morning.  Maternal Data    Feeding Mother's Current Feeding Choice: Breast Milk  LATCH Score Latch: Repeated attempts needed to sustain latch, nipple held in mouth throughout feeding, stimulation needed to elicit sucking reflex. (Infant started sustaining latch with 16 mm NS)  Audible Swallowing: A few with stimulation  Type of Nipple: Everted at rest and after stimulation (short shafted nipples)  Comfort (Breast/Nipple): Soft / non-tender  Assistance with latch    Score : 7      Lactation Tools Discussed/Used Tools: Nipple  Shields;Pump;Flanges Nipple shield size: 16 (short shafted) Flange Size: 21 Breast pump type: Double-Electric Breast Pump Pump Education: Setup, frequency, and cleaning;Milk Storage Reason for Pumping: due Birth Parent receiving 16 mm NS due infant not sustaining latch and Birth Parent is short shafted. Pumping frequency: Birth Parent will pump every 3 hours for 15 minutes on inital setting.  Interventions Interventions: Position options;Education;Skin to skin;Support pillows;Adjust position;Assisted with latch  Discharge    Consult Status Consult Status: Follow-up Date: 08/19/22 Follow-up type: In-patient    Eulis Canner 08/19/2022, 12:08 AM

## 2022-08-19 NOTE — Progress Notes (Addendum)
Beverly Richards [id number] Postpartum Postoperative Day # 2  Beverly Richards, G1P1001, [redacted]w[redacted]d S/P PCS LT Cesarean Section due to pt was [facility]  procardia xl, PCR was 0.19. Current BP 127/78 still controlled with '30mg'$  XL procardia, pt had progress with IOL with IP foley, Cytotec, AROM and pitocin with failure to progress with no fetal decent during pushing, pt elected for a PCS versus vacuum. Pt was taken to the OR with DR CLandry Mellowon 11/17, QBL 3132m, hgb drop of 9.1-9.5 over her stay with h/o IDA, PO iron continued, asymptomatic, pt had some uterine atony PP and was given cytotec 20029m'@4'$ -6 H for 24 hours. Had viable BB who desires in pt circ. Pt has h/o anxiety and depression, pt desires to start zoloft today. H/O chrones, not taking motrin for pain.   Patient Active Problem List   Diagnosis Date Noted   Encounter for induction of labor 08/18/2022   Chronic hypertension affecting pregnancy 08/18/2022   S/P cesarean section 08/18/2022   Normal postpartum course 08/18/2022   Iron deficiency anemia 08/18/2022   Anemia, iron deficiency 09/14/2014   Crohn disease ([place]CC[place]0/27/2015   Ileitis, terminal ([place]CC[place]1/23/2015   Protein-calorie malnutrition, severe (HCC[place]1/22/2015   Anemia 10/21/2013   Abdominal pain 10/21/2013   Leukopenia 10/21/2013   Monocytosis 10/21/2013     Active Ambulatory Problems    Diagnosis Date Noted   Anemia 10/21/2013   Abdominal pain 10/21/2013   Leukopenia 10/21/2013   Monocytosis 10/21/2013   Protein-calorie malnutrition, severe (HCC[place]1/22/2015   Ileitis, terminal ([place]CC[place]1/23/2015   Crohn disease (HCC[place]0/27/2015   Anemia, iron deficiency 09/14/2014   Resolved Ambulatory Problems    Diagnosis Date Noted   No Resolved Ambulatory Problems   Past Medical History:  Diagnosis Date   Anxiety    Crohn's disease ([place]CC[place]  Hypertension    Ovarian cyst      Subjective: Patient up ad lib, denies syncope or  dizziness. Reports consuming regular diet without issues and denies N/V. Patient reports 0 bowel movement + passing flatus.  Denies issues with urination and reports bleeding is "lighter."  Patient is breast/donnor milk feeding and reports going good with lactation but then gets frustrated when alone and baby does not latch so well.  Desires undecided for postpartum contraception.  Pain is being appropriately managed with use of po meds. Pt mood is stilghtly increased with anxiety, discussed PPD risk of anxiety and depression, pt desires to start zoloft prophylactic starting today, denies SI/HI.Pot denies HA, RUQ pain or vision changes.    Objective: Patient Vitals for the past 24 hrs:  BP Temp Temp src Pulse Resp SpO2  08/19/22 0615 127/78 98.8 F (37.1 C) Oral 88 18 98 %  08/18/22 2236 113/64 98.7 F (37.1 C) Oral 97 16 99 %  08/18/22 1631 126/86 98 F (36.7 C) Oral 76 16 100 %  08/18/22 1300 123/72 98.3 F (36.8 C) Oral 86 18 99 %  08/18/22 1101 (!) 137/91 98.7 F (37.1 C) Oral 90 18 100 %  08/18/22 0840 (!) 155/89 98.4 F (36.9 C) Oral 75 18 100 %     Physical Exam:  General: alert, cooperative, and appears stated age Mood/Affect: Happy Lungs: clear to auscultation, no wheezes, rales or rhonchi, symmetric air entry.  Heart: normal rate, regular rhythm, normal S1, S2, no murmurs, rubs, clicks or gallops. Breast: breasts appear normal, no suspicious masses, no skin or  nipple changes or axillary nodes. Abdomen:  + bowel sounds, soft, non-tender Incision: healing well, no significant drainage, no dehiscence, no significant erythema, Honeycomb dressing  Uterine Fundus: firm, involution -1 Lochia: appropriate Skin: Warm, Dry. DVT Evaluation: No evidence of DVT seen on physical exam. Negative Homan's sign. No cords or calf tenderness. +1 pitting calf/ankle edema bilat.   Labs: Recent Labs    08/16/22 1117 08/17/22 2353 08/18/22 0455  HGB 9.6* 10.4* 9.5*  HCT 29.1* 30.5* 29.0*   WBC 6.8 15.6* 16.8*    CBG (last 3)  No results for input(s): "GLUCAP" in the last 72 hours.   I/O: I/O last 3 completed shifts: In: 2696.2 [P.O.:240; I.V.:2279.8; Other:176.4] Out: 09983 [Urine:10500; Blood:314]   Assessment Postpartum Postoperative Day # 2  Beverly Richards, G1P1001, [redacted]w[redacted]d S/P PCS LT Cesarean Section due to pt was [facility] on 11/[facility]  [facility] x[facility], PCR was 0.19. Current BP 127/78 still controlled with '30mg'$  XL procardia, pt had progress with IOL with IP foley, Cytotec, AROM and pitocin with failure to progress with no fetal decent during pushing, pt elected for a PCS versus vacuum. Pt was taken to the OR with DR CLandry Mellowon 11/17, QBL 3156m, hgb drop of 9.1-9.5 over her stay with h/o IDA, PO iron continued, asymptomatic, pt had some uterine atony PP and was given cytotec 20051m'@4'$ -6 H for 24 hours. Had viable BB who desires in pt circ. Pt has h/o anxiety and depression, pt desires to start zoloft today. H/O chrones, not taking motrin for pain.   Pt stable. -1 Involution. Breast Feeding. Hemodynamically Stable.  Plan: Continue other mgmt as ordered IDA: PO Iron H/O GAD/MDD: started '25mg'$  zoloft today.  CHTN: Continue procardia '30mg'$  XL, if BP >140/90 will increase to '60mg'$ .  BB: desires in pt circ tomorrow, r/t feeding issues.  VTE Prophylactics: SCD, ambulated as tolerates.  Pain control: Motrin/Tylenol/Narcotics PRN Education given regarding options for contraception, including barrier methods, injectable contraception, IUD placement, oral contraceptives.  Plan for discharge tomorrow, Breastfeeding, Lactation consult, and Circumcision prior to discharge  Dr. ColLandry Mellow be updated on patient status  JadRobesonNP-C, PMHNP-BC  [address] [id number]: [phone] Phone: [phone]: [phone] 4532/19/2023  7:00 AM

## 2022-08-20 ENCOUNTER — Other Ambulatory Visit (HOSPITAL_BASED_OUTPATIENT_CLINIC_OR_DEPARTMENT_OTHER): Payer: Self-pay

## 2022-08-20 MED ORDER — OXYCODONE HCL 5 MG PO TABS
5.0000 mg | ORAL_TABLET | Freq: Four times a day (QID) | ORAL | 0 refills | Status: AC | PRN
  Filled 2022-08-20: qty 28, 7d supply, fill #0

## 2022-08-20 MED ORDER — DIBUCAINE (PERIANAL) 1 % EX OINT
1.0000 | TOPICAL_OINTMENT | CUTANEOUS | 1 refills | Status: AC | PRN
  Filled 2022-08-20: qty 90, fill #0

## 2022-08-20 MED ORDER — SERTRALINE HCL 25 MG PO TABS
25.0000 mg | ORAL_TABLET | Freq: Every day | ORAL | 1 refills | Status: AC
  Filled 2022-08-20: qty 30, 30d supply, fill #0

## 2022-08-20 MED ORDER — ACETAMINOPHEN 500 MG PO TABS
1000.0000 mg | ORAL_TABLET | Freq: Four times a day (QID) | ORAL | 0 refills | Status: AC
  Filled 2022-08-20: qty 100, 13d supply, fill #0

## 2022-08-20 MED ORDER — WITCH HAZEL-GLYCERIN EX PADS
1.0000 | MEDICATED_PAD | CUTANEOUS | 12 refills | Status: AC | PRN
  Filled 2022-08-20: qty 40, fill #0

## 2022-08-20 NOTE — Anesthesia Postprocedure Evaluation (Signed)
Anesthesia Post Note  Patient: Beverly Richards  Procedure(s) Performed: CESAREAN SECTION (Abdomen)     Patient location during evaluation: PACU Anesthesia Type: Epidural Level of consciousness: awake Pain management: pain level controlled Vital Signs Assessment: post-procedure vital signs reviewed and stable Respiratory status: spontaneous breathing Cardiovascular status: stable Postop Assessment: no headache, no backache, epidural receding, patient able to bend at knees and no apparent nausea or vomiting Anesthetic complications: no  No notable events documented.  Last Vitals:  Vitals:   08/20/22 0303 08/20/22 0621  BP: 135/87 120/80  Pulse: 72 75  Resp:  18  Temp:  36.8 C  SpO2:  100%    Last Pain:  Vitals:   08/20/22 0621  TempSrc: Oral  PainSc: 7    Pain Goal:                   Huston Foley

## 2022-08-20 NOTE — Lactation Note (Signed)
This note was copied from a baby's chart. Lactation Consultation Note  Patient Name: Beverly Richards Date: 08/20/2022 Reason for consult: Follow-up assessment Age:30 hours   P1: Early term infant at 37+2 weeks Feeding preference: Breast/donor breast milk Weight loss: 8%  "[name]" was asleep on mother's chest when I arrived.  Mother was happy to report that he is progressing nicely and she can now latch independently.  Encouraged to continue feeding at least every three hours or sooner due to gestational age and weight.  Asked mother to increase volumes to 30+/feeding session.  Baby is voiding/stooling.  Mother has started to see more drops with pumping.  Emphasized the importance of every three hours with her pumping to help ensure a good milk supply.  Allowed time for questions.  Mother is feeling more confident.  She has our op phone number for any concerns after discharge.  No support person present at this time.   Maternal Data    Feeding Nipple Type: Extra Slow Flow  LATCH Score Latch: Grasps breast easily, tongue down, lips flanged, rhythmical sucking.  Audible Swallowing: A few with stimulation  Type of Nipple: Everted at rest and after stimulation  Comfort (Breast/Nipple): Soft / non-tender  Hold (Positioning): Assistance needed to correctly position infant at breast and maintain latch.  LATCH Score: 8   Lactation Tools Discussed/Used    Interventions    Discharge Discharge Education: Engorgement and breast care  Consult Status Consult Status: Complete Date: 08/20/22 Follow-up type: Call as needed    Claira Jeter R Hubbert Landrigan 08/20/2022, 10:09 AM

## 2022-08-20 NOTE — Discharge Summary (Signed)
Postpartum Discharge Summary  Date of Service updated11/20/23     Patient Name: Beverly Richards DOB: 1991-01-25 MRN: [id number]  Date of admission: 08/15/2022 Delivery date:08/17/2022  Delivering provider: Christophe Louis  Date of discharge: 08/20/2022  Admitting diagnosis: Pregnancy [Z34.90] Intrauterine pregnancy: [redacted]w[redacted]d    Secondary diagnosis:  Active Problems:   Encounter for induction of labor   Chronic hypertension affecting pregnancy   S/P cesarean section   Normal postpartum course   Iron deficiency anemia  Additional problems: chtn    Discharge diagnosis: Term Pregnancy Delivered and CHTN                                              Post partum procedures:   Augmentation: Pitocin and Cytotec Complications: None  Hospital course: Induction of Labor With Cesarean Section   31y.o. yo G1P1001 at 345w2das admitted to the hospital 08/15/2022 for induction of labor. Patient had a labor course significant for FTD she pushed for four hours. The patient went for cesarean section due to Arrest of Descent. Delivery details are as follows: Membrane Rupture Time/Date: 6:30 PM ,08/16/2022   Delivery Method:C-Section, Low Vertical  Details of operation can be found in separate operative Note.  Patient had a postpartum course complicated bynothing. She is ambulating, tolerating a regular diet, passing flatus, and urinating well.  Patient is discharged home in stable condition on 08/20/22.      Newborn Data: Birth date:08/17/2022  Birth time:8:58 PM  Gender:Female  Living status:Living  Apgars:8 ,9  Weight:3050 g                                Magnesium Sulfate received: No BMZ received: No Rhophylac:No MMR:No T-DaP:Given postpartum Flu: No Transfusion:No  Physical exam  Vitals:   08/20/22 0303 08/20/22 0621 08/20/22 1030 08/20/22 1251  BP: 135/87 120/80 134/82 119/68  Pulse: 72 75 90 85  Resp:  _0 Temp:  98.2 F (36.8 C) 98 F (36.7 C) 98 F (36.7 C)   TempSrc:  Oral Oral Oral  SpO2:  100%  99%  Weight:      Height:       General: alert and cooperative Lochia: appropriate Uterine Fundus: firm Incision: Healing well with no significant drainage DVT Evaluation: Negative Homan's sign. Labs: Lab Results  Component Value Date   WBC 16.8 (H) 08/18/2022   HGB 9.5 (L) 08/18/2022   HCT 29.0 (L) 08/18/2022   MCV 89.2 08/18/2022   PLT 227 08/18/2022      Latest Ref Rng & Units 08/15/2022    7:35 AM  CMP  Glucose 70 - 99 mg/dL 66   BUN 6 - 20 mg/dL 8   Creatinine 0.44 - 1.00 mg/dL 0.65   Sodium 135 - 145 mmol/L 137   Potassium 3.5 - 5.1 mmol/L 3.8   Chloride 98 - 111 mmol/L 105   CO2 22 - 32 mmol/L 18   Calcium 8.9 - 10.3 mg/dL 8.8   Total Protein 6.5 - 8.1 g/dL 6.2   Total Bilirubin 0.3 - 1.2 mg/dL 0.3   Alkaline Phos 38 - 126 U/L 79   AST 15 - 41 U/L 25   ALT 0 - 44 U/L 21    Edinburgh Score:    08/20/2022  8:38 AM  Flavia Shipper Postnatal Depression Scale Screening Tool  I have been able to laugh and see the funny side of things. 0  I have looked forward with enjoyment to things. 0  I have blamed myself unnecessarily when things went wrong. 2  I have been anxious or worried for no good reason. 2  I have felt scared or panicky for no good reason. 1  Things have been getting on top of me. 1  I have been so unhappy that I have had difficulty sleeping. 0  I have felt sad or miserable. 0  I have been so unhappy that I have been crying. 0  The thought of harming myself has occurred to me. 0  Edinburgh Postnatal Depression Scale Total 6      After visit meds:  Allergies as of 08/20/2022       Reactions   Benadryl [diphenhydramine Hcl] Hives   Penicillins Hives        Medication List     STOP taking these medications    metroNIDAZOLE 1 % gel Commonly known as: Metrogel       TAKE these medications    acetaminophen 500 MG tablet Commonly known as: TYLENOL Take 2 tablets (1,000 mg total) by mouth every  6 (six) hours.   dibucaine 1 % Oint Commonly known as: NUPERCAINAL Place 1 Application rectally as needed for hemorrhoids.   ferrous sulfate 324 MG Tbec Take 324 mg by mouth.   multivitamin with minerals Tabs tablet Take 1 tablet by mouth every morning.   NIFEdipine 30 MG 24 hr tablet Commonly known as: PROCARDIA-XL/NIFEDICAL-XL Take 30 mg by mouth daily.   oxyCODONE 5 MG immediate release tablet Commonly known as: Oxy IR/ROXICODONE Take 1 tablet (5 mg total) by mouth every 6 (six) hours as needed for severe pain.   sertraline 25 MG tablet Commonly known as: ZOLOFT Take 1 tablet (25 mg total) by mouth daily. Start taking on: August 21, 2022   witch hazel-glycerin pad Commonly known as: TUCKS Apply 1 Application topically as needed for hemorrhoids.         Discharge home in stable condition Infant Feeding: Breast Infant Disposition:home with mother Discharge instruction: per After Visit Summary and Postpartum booklet. Activity: Advance as tolerated. Pelvic rest for 6 weeks.  Diet: routine diet Anticipated Birth Control: Unsure Postpartum Appointment:1 week Additional Postpartum F/U:  na Future Appointments:No future appointments. Follow up Visit:  [facility]. Schedule an appointment as soon as possible for a visit in 1 week(s).   Specialty: Obstetrics and Gynecology Why: 1 week BP check and 6 week PPV Contact information: [address] [place] [address] [phone]                    08/20/2022 Betsy Coder, MD

## 2022-08-27 ENCOUNTER — Telehealth (HOSPITAL_COMMUNITY): Payer: Self-pay | Admitting: *Deleted

## 2022-08-27 NOTE — Telephone Encounter (Signed)
Attempted Hospital Discharge Follow-Up Call.  Left voice mail requesting that patient return RN's phone call if patient has any concerns or questions regarding herself or her baby.
